# Patient Record
Sex: Female | Born: 1950 | Race: Black or African American | Hispanic: No | Smoking: Current every day smoker
Health system: Southern US, Community
[De-identification: ages and names within clinical notes are randomized; demographics above are authoritative.]

## PROBLEM LIST (undated history)

## (undated) DIAGNOSIS — J45909 Unspecified asthma, uncomplicated: Secondary | ICD-10-CM

## (undated) DIAGNOSIS — E119 Type 2 diabetes mellitus without complications: Secondary | ICD-10-CM

## (undated) DIAGNOSIS — E785 Hyperlipidemia, unspecified: Secondary | ICD-10-CM

## (undated) DIAGNOSIS — M810 Age-related osteoporosis without current pathological fracture: Secondary | ICD-10-CM

## (undated) HISTORY — PX: TUBAL LIGATION: SHX77

## (undated) HISTORY — DX: Unspecified asthma, uncomplicated: J45.909

---

## 1997-12-23 ENCOUNTER — Encounter: Admission: RE | Admit: 1997-12-23 | Discharge: 1997-12-23 | Payer: Self-pay | Admitting: Family Medicine

## 1998-01-19 ENCOUNTER — Encounter: Admission: RE | Admit: 1998-01-19 | Discharge: 1998-01-19 | Payer: Self-pay | Admitting: *Deleted

## 1998-03-22 ENCOUNTER — Encounter: Admission: RE | Admit: 1998-03-22 | Discharge: 1998-03-22 | Payer: Self-pay | Admitting: Family Medicine

## 1998-04-18 ENCOUNTER — Encounter: Admission: RE | Admit: 1998-04-18 | Discharge: 1998-04-18 | Payer: Self-pay | Admitting: Family Medicine

## 1998-05-23 ENCOUNTER — Encounter: Admission: RE | Admit: 1998-05-23 | Discharge: 1998-05-23 | Payer: Self-pay | Admitting: Family Medicine

## 1998-08-02 ENCOUNTER — Other Ambulatory Visit: Admission: RE | Admit: 1998-08-02 | Discharge: 1998-09-08 | Payer: Self-pay

## 1998-08-02 ENCOUNTER — Encounter: Admission: RE | Admit: 1998-08-02 | Discharge: 1998-08-02 | Payer: Self-pay | Admitting: Family Medicine

## 1998-08-09 ENCOUNTER — Encounter: Admission: RE | Admit: 1998-08-09 | Discharge: 1998-08-09 | Payer: Self-pay | Admitting: Family Medicine

## 1999-02-13 ENCOUNTER — Encounter: Admission: RE | Admit: 1999-02-13 | Discharge: 1999-02-13 | Payer: Self-pay | Admitting: Sports Medicine

## 1999-05-28 ENCOUNTER — Encounter: Admission: RE | Admit: 1999-05-28 | Discharge: 1999-08-26 | Payer: Self-pay | Admitting: *Deleted

## 1999-08-23 ENCOUNTER — Encounter: Admission: RE | Admit: 1999-08-23 | Discharge: 1999-08-23 | Payer: Self-pay | Admitting: Family Medicine

## 1999-12-03 ENCOUNTER — Encounter: Admission: RE | Admit: 1999-12-03 | Discharge: 1999-12-03 | Payer: Self-pay | Admitting: Family Medicine

## 2000-01-08 ENCOUNTER — Encounter: Admission: RE | Admit: 2000-01-08 | Discharge: 2000-01-08 | Payer: Self-pay | Admitting: Family Medicine

## 2000-01-29 ENCOUNTER — Encounter: Admission: RE | Admit: 2000-01-29 | Discharge: 2000-01-29 | Payer: Self-pay | Admitting: Family Medicine

## 2000-02-05 ENCOUNTER — Encounter: Admission: RE | Admit: 2000-02-05 | Discharge: 2000-02-05 | Payer: Self-pay | Admitting: *Deleted

## 2000-02-13 ENCOUNTER — Encounter: Admission: RE | Admit: 2000-02-13 | Discharge: 2000-02-13 | Payer: Self-pay | Admitting: Family Medicine

## 2000-02-28 ENCOUNTER — Encounter: Admission: RE | Admit: 2000-02-28 | Discharge: 2000-02-28 | Payer: Self-pay | Admitting: Family Medicine

## 2000-06-09 ENCOUNTER — Encounter: Admission: RE | Admit: 2000-06-09 | Discharge: 2000-06-09 | Payer: Self-pay | Admitting: Family Medicine

## 2000-07-21 ENCOUNTER — Encounter: Admission: RE | Admit: 2000-07-21 | Discharge: 2000-07-21 | Payer: Self-pay | Admitting: Family Medicine

## 2000-08-15 ENCOUNTER — Encounter: Admission: RE | Admit: 2000-08-15 | Discharge: 2000-08-15 | Payer: Self-pay | Admitting: Family Medicine

## 2000-10-15 ENCOUNTER — Encounter: Admission: RE | Admit: 2000-10-15 | Discharge: 2000-10-15 | Payer: Self-pay | Admitting: Family Medicine

## 2000-10-20 ENCOUNTER — Encounter: Admission: RE | Admit: 2000-10-20 | Discharge: 2000-10-20 | Payer: Self-pay | Admitting: Family Medicine

## 2000-10-22 ENCOUNTER — Encounter: Admission: RE | Admit: 2000-10-22 | Discharge: 2000-10-22 | Payer: Self-pay | Admitting: Family Medicine

## 2000-10-24 ENCOUNTER — Encounter: Admission: RE | Admit: 2000-10-24 | Discharge: 2000-10-24 | Payer: Self-pay | Admitting: Family Medicine

## 2000-10-30 ENCOUNTER — Encounter: Admission: RE | Admit: 2000-10-30 | Discharge: 2000-10-30 | Payer: Self-pay | Admitting: Family Medicine

## 2000-12-28 ENCOUNTER — Inpatient Hospital Stay (HOSPITAL_COMMUNITY): Admission: EM | Admit: 2000-12-28 | Discharge: 2000-12-29 | Payer: Self-pay

## 2001-01-02 ENCOUNTER — Encounter: Admission: RE | Admit: 2001-01-02 | Discharge: 2001-01-02 | Payer: Self-pay | Admitting: Family Medicine

## 2001-01-20 ENCOUNTER — Encounter: Admission: RE | Admit: 2001-01-20 | Discharge: 2001-01-20 | Payer: Self-pay | Admitting: Family Medicine

## 2001-03-02 ENCOUNTER — Encounter: Admission: RE | Admit: 2001-03-02 | Discharge: 2001-03-02 | Payer: Self-pay | Admitting: Family Medicine

## 2001-03-04 ENCOUNTER — Encounter: Payer: Self-pay | Admitting: Sports Medicine

## 2001-03-04 ENCOUNTER — Encounter: Admission: RE | Admit: 2001-03-04 | Discharge: 2001-03-04 | Payer: Self-pay | Admitting: Sports Medicine

## 2001-08-06 ENCOUNTER — Encounter: Admission: RE | Admit: 2001-08-06 | Discharge: 2001-08-06 | Payer: Self-pay | Admitting: Family Medicine

## 2001-08-20 ENCOUNTER — Encounter: Admission: RE | Admit: 2001-08-20 | Discharge: 2001-08-20 | Payer: Self-pay | Admitting: Family Medicine

## 2002-02-17 ENCOUNTER — Encounter: Admission: RE | Admit: 2002-02-17 | Discharge: 2002-02-17 | Payer: Self-pay | Admitting: Family Medicine

## 2003-06-29 ENCOUNTER — Encounter (INDEPENDENT_AMBULATORY_CARE_PROVIDER_SITE_OTHER): Payer: Self-pay | Admitting: *Deleted

## 2003-06-29 ENCOUNTER — Encounter: Admission: RE | Admit: 2003-06-29 | Discharge: 2003-06-29 | Payer: Self-pay | Admitting: Family Medicine

## 2004-11-25 ENCOUNTER — Encounter (INDEPENDENT_AMBULATORY_CARE_PROVIDER_SITE_OTHER): Payer: Self-pay | Admitting: *Deleted

## 2004-11-25 LAB — CONVERTED CEMR LAB

## 2006-04-11 ENCOUNTER — Ambulatory Visit: Payer: Self-pay | Admitting: Family Medicine

## 2006-06-18 ENCOUNTER — Encounter (INDEPENDENT_AMBULATORY_CARE_PROVIDER_SITE_OTHER): Payer: Self-pay | Admitting: Family Medicine

## 2006-06-18 ENCOUNTER — Ambulatory Visit: Payer: Self-pay | Admitting: Family Medicine

## 2006-06-18 LAB — CONVERTED CEMR LAB
ALT: 10 units/L (ref 0–35)
AST: 11 units/L (ref 0–37)
Albumin: 4.4 g/dL (ref 3.5–5.2)
Alkaline Phosphatase: 91 units/L (ref 39–117)
BUN: 10 mg/dL (ref 6–23)
CO2: 23 meq/L (ref 19–32)
Calcium: 9 mg/dL (ref 8.4–10.5)
Chloride: 103 meq/L (ref 96–112)
Cholesterol: 196 mg/dL (ref 0–200)
Creatinine, Ser: 0.64 mg/dL (ref 0.40–1.20)
Glucose, Bld: 325 mg/dL — ABNORMAL HIGH (ref 70–99)
HDL: 53 mg/dL (ref >39)
LDL Cholesterol: 127 mg/dL — ABNORMAL HIGH (ref 0–99)
Potassium: 4.1 meq/L (ref 3.5–5.3)
Sodium: 138 meq/L (ref 135–145)
Total Bilirubin: 0.4 mg/dL (ref 0.3–1.2)
Total CHOL/HDL Ratio: 3.7
Total Protein: 7.3 g/dL (ref 6.0–8.3)
Triglycerides: 79 mg/dL (ref <150)
VLDL: 16 mg/dL (ref 0–40)

## 2006-06-19 DIAGNOSIS — E119 Type 2 diabetes mellitus without complications: Secondary | ICD-10-CM

## 2006-06-20 ENCOUNTER — Encounter (INDEPENDENT_AMBULATORY_CARE_PROVIDER_SITE_OTHER): Payer: Self-pay | Admitting: *Deleted

## 2006-07-01 ENCOUNTER — Telehealth: Payer: Self-pay | Admitting: *Deleted

## 2006-07-02 ENCOUNTER — Telehealth (INDEPENDENT_AMBULATORY_CARE_PROVIDER_SITE_OTHER): Payer: Self-pay | Admitting: *Deleted

## 2007-06-11 ENCOUNTER — Ambulatory Visit: Payer: Self-pay | Admitting: Family Medicine

## 2007-06-11 ENCOUNTER — Telehealth: Payer: Self-pay | Admitting: *Deleted

## 2007-06-11 ENCOUNTER — Encounter: Payer: Self-pay | Admitting: Family Medicine

## 2007-06-11 LAB — CONVERTED CEMR LAB
Chlamydia, DNA Probe: NEGATIVE
GC Probe Amp, Genital: NEGATIVE
Whiff Test: POSITIVE

## 2007-06-12 ENCOUNTER — Telehealth: Payer: Self-pay | Admitting: Family Medicine

## 2007-06-17 ENCOUNTER — Encounter: Payer: Self-pay | Admitting: Family Medicine

## 2007-06-18 LAB — CONVERTED CEMR LAB: Pap Smear: NORMAL

## 2007-12-21 ENCOUNTER — Ambulatory Visit: Payer: Self-pay | Admitting: Family Medicine

## 2007-12-21 ENCOUNTER — Encounter: Payer: Self-pay | Admitting: Family Medicine

## 2007-12-21 LAB — CONVERTED CEMR LAB
BUN: 14 mg/dL (ref 6–23)
Chlamydia, DNA Probe: NEGATIVE
Creatinine, Ser: 0.67 mg/dL (ref 0.40–1.20)
GC Probe Amp, Genital: NEGATIVE
Hgb A1c MFr Bld: 9.4 %
Nitrite: NEGATIVE
Specific Gravity, Urine: 1.015
Whiff Test: NEGATIVE

## 2007-12-24 ENCOUNTER — Encounter: Payer: Self-pay | Admitting: Family Medicine

## 2008-04-28 ENCOUNTER — Telehealth: Payer: Self-pay | Admitting: *Deleted

## 2008-04-28 ENCOUNTER — Encounter: Payer: Self-pay | Admitting: *Deleted

## 2008-07-20 ENCOUNTER — Ambulatory Visit: Payer: Self-pay | Admitting: Family Medicine

## 2008-07-20 ENCOUNTER — Telehealth: Payer: Self-pay | Admitting: *Deleted

## 2008-07-20 LAB — CONVERTED CEMR LAB: Hgb A1c MFr Bld: 8.7 %

## 2009-02-08 ENCOUNTER — Ambulatory Visit: Payer: Self-pay | Admitting: Family Medicine

## 2009-02-09 ENCOUNTER — Encounter: Payer: Self-pay | Admitting: *Deleted

## 2009-11-20 ENCOUNTER — Ambulatory Visit: Payer: Self-pay | Admitting: Family Medicine

## 2009-11-20 ENCOUNTER — Encounter: Payer: Self-pay | Admitting: Family Medicine

## 2009-12-07 ENCOUNTER — Telehealth: Payer: Self-pay | Admitting: Family Medicine

## 2010-01-26 ENCOUNTER — Encounter: Payer: Self-pay | Admitting: Family Medicine

## 2010-02-16 ENCOUNTER — Encounter (INDEPENDENT_AMBULATORY_CARE_PROVIDER_SITE_OTHER): Payer: Self-pay | Admitting: Pharmacist

## 2010-02-22 ENCOUNTER — Encounter: Payer: Self-pay | Admitting: Family Medicine

## 2010-04-17 ENCOUNTER — Encounter: Payer: Self-pay | Admitting: *Deleted

## 2010-04-18 ENCOUNTER — Ambulatory Visit
Admission: RE | Admit: 2010-04-18 | Discharge: 2010-04-18 | Payer: Self-pay | Source: Home / Self Care | Attending: Family Medicine | Admitting: Family Medicine

## 2010-04-18 ENCOUNTER — Encounter: Payer: Self-pay | Admitting: Family Medicine

## 2010-05-22 NOTE — Miscellaneous (Signed)
Summary: Consent Skin Bx  Consent Skin Bx   Imported By: Clydell Hakim 11/23/2009 15:18:35  _____________________________________________________________________  External Attachment:    Type:   Image     Comment:   External Document

## 2010-05-22 NOTE — Assessment & Plan Note (Signed)
Summary: rash,tcb   Vital Signs:  Patient profile:   60 year old female Height:      67.0 inches Weight:      108 pounds BMI:     16.98 Temp:     98.3 degrees F oral BP sitting:   140 / 60  (right arm) Cuff size:   regular  Vitals Entered By: Tessie Fass CMA (November 20, 2009 2:22 PM) CC: body rash x 3 months Is Patient Diabetic? Yes Pain Assessment Patient in pain? no        Primary Care Provider:  Ardeen Garland  MD  CC:  body rash x 3 months.  History of Present Illness: Rash all over body for 3 months.  Only itches "when her blood sugars are high".  Using OTC 1% hydrocortisone and some old [place] lotion.  Denies fever or acute illness.  Has not been in to see provider for a while to check up on her diabetes.  Current Medications (verified): 1)  Metformin Hcl 1000 Mg Tabs (Metformin Hcl) .... 1/2 Tab At Night For 1 Week, Then 1 Tab At Night For 1 Week, Then 1/2 Tab in Am  and 1 Tab At Night For 1 Week, and Then 1 Tab Twice A Day Thereafter 2)  Glipizide 10 Mg  Tabs (Glipizide) .Marland Kitchen.. 1 Tab By Mouth Two Times A Day 3)  Hydrocortisone 2.5 % Lotn (Hydrocortisone) .... Apply To Rash Two Times A Day (240 Gm or Similar, Rash If Diffuse and Covers 20 % of Body)  Allergies (verified): 1)  ! Sulfa  Physical Exam  General:  Alert, very thin AA female Skin:  multiple macular, dry lesions on legs, breasts, groin, hands. Cervical Nodes:  No lymphadenopathy noted   Impression & Recommendations:  Problem # 1:  SKIN RASH (ICD-782.1) Punch biopsy completed by Dr. Wynona Neat wtih 4mm punch.  Topical steroids.  Return for 2 week check. Her updated medication list for this problem includes:    Hydrocortisone 2.5 % Lotn (Hydrocortisone) .Marland Kitchen... Apply to rash two times a day (240 gm or similar, rash if diffuse and covers 20 % of body)  Orders: [facility]- Est Level  3 ([address]) Punch Biopsy- [facility] ([address])  Problem # 2:  DIABETES MELLITUS II, UNCOMPLICATED (ICD-250.00) Has not been in for check since  October 2010, to meet new MD and have A1C and other prevention addressed Her updated medication list for this problem includes:    Metformin Hcl 1000 Mg Tabs (Metformin hcl) .Marland Kitchen... 1/2 tab at night for 1 week, then 1 tab at night for 1 week, then 1/2 tab in am  and 1 tab at night for 1 week, and then 1 tab twice a day thereafter    Glipizide 10 Mg Tabs (Glipizide) .Marland Kitchen... 1 tab by mouth two times a day  Complete Medication List: 1)  Metformin Hcl 1000 Mg Tabs (Metformin hcl) .... 1/2 tab at night for 1 week, then 1 tab at night for 1 week, then 1/2 tab in am  and 1 tab at night for 1 week, and then 1 tab twice a day thereafter 2)  Glipizide 10 Mg Tabs (Glipizide) .Marland Kitchen.. 1 tab by mouth two times a day 3)  Hydrocortisone 2.5 % Lotn (Hydrocortisone) .... Apply to rash two times a day (240 gm or similar, rash if diffuse and covers 20 % of body)  Patient Instructions: 1)  Use lotion on rash twice daily 2)  Follow up with primary MD in 2 weeks, you need  your diabetes checked. Prescriptions: HYDROCORTISONE 2.5 % LOTN (HYDROCORTISONE) apply to rash two times a day (240 GM or similar, rash if diffuse and covers 20 % of body)  #1 x 3   Entered and Authorized by:   Luretha Murphy NP   Signed by:   Luretha Murphy NP on 11/20/2009   Method used:   Electronically to        [facility] (retail)       [address]       [place], [place]  [address]       Ph: [phone]       Fax: [phone]   RxID:   [id number]

## 2010-05-22 NOTE — Miscellaneous (Signed)
Summary: Orders Update   Clinical Lists Changes  Problems: Added new problem of ENCOUNTER FOR LONG-TERM USE OF OTHER MEDICATIONS (ICD-V58.69) Orders: Added new Test order of B12-[facility] [phone]) - Signed Added new Test order of CBC-[facility] ([address]56) - Signed  Ok per Dr. Fara Boros

## 2010-05-22 NOTE — Progress Notes (Signed)
Summary: Test Res  Medications Added HYDROCORTISONE 2.5 % CREA (HYDROCORTISONE) Apply to rash two times a day (larger tube if possible, rash covers >20% of body)       Phone Note Call from Patient Call back at Home Phone [phone]   Caller: Patient Summary of Call: Pt was unable to keep appt today and wondering if someone could call her back with her bx results she had the last time she was in. Initial call taken by: Clydell Hakim,  December 07, 2009 2:45 PM  Follow-up for Phone Call        states she never bought the hydrocortisone creme as it was over $100. wants to know what else can she use that does not cost so much. she has rescheduled her appt for 12/26/09 Follow-up by: Golden Circle RN,  December 07, 2009 3:11 PM    Additional Follow-up for Phone Call Additional follow up Details #2::    Attempted to call pt to inform her I have called in a different prescription that should only be $4 to the [facility] on [address] Rx should be ready later today for her to pick up.  Follow-up by: Demetria Pore MD,  December 07, 2009 4:17 PM  New/Updated Medications: HYDROCORTISONE 2.5 % CREA (HYDROCORTISONE) Apply to rash two times a day (larger tube if possible, rash covers >20% of body) Prescriptions: HYDROCORTISONE 2.5 % CREA (HYDROCORTISONE) Apply to rash two times a day (larger tube if possible, rash covers >20% of body)  #1 tube x 1   Entered by:   Demetria Pore MD   Authorized by:   Luretha Murphy NP   Signed by:   Demetria Pore MD on 12/07/2009   Method used:   Electronically to        [facility] (retail)       [address]       [place], [place]  [address]       Ph: [phone]       Fax: [phone]   RxID:   [id number]

## 2010-05-22 NOTE — Miscellaneous (Signed)
   Clinical Lists Changes  Problems: Removed problem of ENCOUNTER FOR LONG-TERM USE OF OTHER MEDICATIONS (ICD-V58.69) Removed problem of SKIN RASH (ICD-782.1) Removed problem of ANOREXIA (ICD-783.0) Removed problem of CORNS AND CALLUSES (ICD-700) Removed problem of SEXUALLY TRANSMITTED DISEASE, EXPOSURE TO (ICD-V01.6) Removed problem of DYSURIA (ICD-788.1) Removed problem of TRICHOMONIASIS (ICD-131.9) Removed problem of VAGINAL DISCHARGE (ICD-623.5) Removed problem of GYNECOLOGICAL EXAMINATIONOUTINE (ICD-V72.31) Removed problem of SCREENING FOR MALIGNANT NEOPLASM(ICD-V76.2) Removed problem of TOBACCO DEPENDENCE (ICD-305.1) Removed problem of MENOPAUSAL SYNDROME (ICD-627.2)

## 2010-05-22 NOTE — Letter (Signed)
Summary: Generic Letter  [facility]     [place], [place]    Phone:   Fax:     01/26/2010  [facility] [address] [place], [place]  [address]  Dear Ms. Ndiaye,   I am writing to inform you that you need to make an appointment to come in for your yearly well-woman visit, which includes a PAP smear. Your most recent PAP smear was normal, but we like to do them every year. In addition to your PAP smear for cervical cancer screening, you also need to call and have a mammogram done for breast cancer screening, as well as a colonoscopy for colon cancer screening. Please call our office to schedule an appointment with me, Dr. Fara Boros, your new primary care doctor.  I look forward to meeting you!        Sincerely,   Demetria Pore MD  Appended Document: Generic Letter mailed.

## 2010-05-23 ENCOUNTER — Telehealth: Payer: Self-pay | Admitting: Family Medicine

## 2010-05-23 LAB — CONVERTED CEMR LAB
BUN: 9 mg/dL (ref 6–23)
Calcium: 9.7 mg/dL (ref 8.4–10.5)
Creatinine, Ser: 0.75 mg/dL (ref 0.40–1.20)
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
## 2010-05-24 NOTE — Miscellaneous (Signed)
Summary: regarding meds   Clinical Lists Changes  received refill request for glipizide. called patient to schedule appointment for follow up. shes states she has been out of metformin and has not taken for a while . she states at some point this refilled was denied.  she has not been in in over a year for check up .  appointment scheduld tomorrow with Dr. Denyse Amass for diabetes follow up and med refills. appointment scheduled 05/11/2010 for CPE with Dr. Fara Boros. patient states she  does not have  the necessary co payment . checked with Britta Mccreedy and she advises since patient needs to been seen in order for mds to be refilled  she can be billed for this co pay. Theresia Lo RN  April 17, 2010 11:46 AM

## 2010-05-25 ENCOUNTER — Ambulatory Visit: Admit: 2010-05-25 | Payer: Self-pay

## 2010-05-25 ENCOUNTER — Encounter: Payer: Self-pay | Admitting: Family Medicine

## 2010-05-25 ENCOUNTER — Other Ambulatory Visit: Payer: Self-pay | Admitting: Hematology and Oncology

## 2010-05-25 ENCOUNTER — Encounter (INDEPENDENT_AMBULATORY_CARE_PROVIDER_SITE_OTHER): Payer: BC Managed Care – PPO | Admitting: Family Medicine

## 2010-05-25 ENCOUNTER — Other Ambulatory Visit: Payer: Self-pay | Admitting: Family Medicine

## 2010-05-25 DIAGNOSIS — M810 Age-related osteoporosis without current pathological fracture: Secondary | ICD-10-CM

## 2010-05-25 DIAGNOSIS — Z0000 Encounter for general adult medical examination without abnormal findings: Secondary | ICD-10-CM

## 2010-05-25 DIAGNOSIS — E119 Type 2 diabetes mellitus without complications: Secondary | ICD-10-CM

## 2010-05-25 DIAGNOSIS — E785 Hyperlipidemia, unspecified: Secondary | ICD-10-CM

## 2010-05-25 DIAGNOSIS — Z124 Encounter for screening for malignant neoplasm of cervix: Secondary | ICD-10-CM

## 2010-05-25 LAB — CONVERTED CEMR LAB
Cholesterol, target level: 200 mg/dL
Cholesterol: 182 mg/dL (ref 0–200)
HDL: 51 mg/dL (ref >39)
LDL Goal: 100 mg/dL
Platelets: 280 10*3/uL (ref 150–400)
RDW: 14.2 % (ref 11.5–15.5)
Total CHOL/HDL Ratio: 3.6
Triglycerides: 114 mg/dL (ref <150)
WBC: 16.1 10*3/uL — ABNORMAL HIGH (ref 4.0–10.5)

## 2010-05-25 LAB — VITAMIN B12: Vitamin B-12: 573 pg/mL (ref 211–911)

## 2010-05-25 LAB — CBC
MCH: 32 pg (ref 26.0–34.0)
MCHC: 32.5 g/dL (ref 30.0–36.0)
Platelets: 280 10*3/uL (ref 150–400)
RDW: 14.2 % (ref 11.5–15.5)

## 2010-05-25 LAB — LIPID PANEL
HDL: 51 mg/dL (ref >39)
LDL Cholesterol: 108 mg/dL — ABNORMAL HIGH (ref 0–99)
Total CHOL/HDL Ratio: 3.6 Ratio
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
## 2010-05-30 NOTE — Progress Notes (Signed)
 ----   Converted from flag ---- ---- 05/23/2010 9:00 AM, Demetria Pore MD wrote: Elvina Sidle, can anyone call Ms. Dung to remind her to come in on Friday fasting so we can get a FLP? I'm going to put the future order in now. Or, if she wants, she can come in tomorrow for it if for some reason she doesn't want to be fasting for her 9a appt Fri. Thanks!!!! Annice Pih ------------------------------  attemted to call pt, no answer or voicemail. will try again later.spoke with pt. she will be here at 8:45. told her she can bring something to eat & drink after she has her labs drawn. she agreed.[place] RN  May 24, 2010 9:14 AM  Randie Heinz. Thanks! Demetria Pore MD  May 24, 2010 10:46 AM

## 2010-05-31 ENCOUNTER — Encounter: Payer: Self-pay | Admitting: Family Medicine

## 2010-05-31 DIAGNOSIS — E119 Type 2 diabetes mellitus without complications: Secondary | ICD-10-CM

## 2010-06-07 ENCOUNTER — Encounter: Payer: Self-pay | Admitting: *Deleted

## 2010-07-05 ENCOUNTER — Encounter: Payer: Self-pay | Admitting: *Deleted
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
## 2014-05-17 ENCOUNTER — Emergency Department (HOSPITAL_COMMUNITY): Payer: BC Managed Care – PPO

## 2014-05-17 ENCOUNTER — Encounter (HOSPITAL_COMMUNITY): Payer: Self-pay

## 2014-05-17 ENCOUNTER — Observation Stay (HOSPITAL_COMMUNITY)
Admission: EM | Admit: 2014-05-17 | Discharge: 2014-05-19 | Disposition: A | Discharge status: Home / Self Care | Payer: BC Managed Care – PPO | Attending: Internal Medicine | Admitting: Internal Medicine

## 2014-05-17 DIAGNOSIS — E118 Type 2 diabetes mellitus with unspecified complications: Secondary | ICD-10-CM

## 2014-05-17 DIAGNOSIS — M810 Age-related osteoporosis without current pathological fracture: Secondary | ICD-10-CM | POA: Diagnosis not present

## 2014-05-17 DIAGNOSIS — R52 Pain, unspecified: Secondary | ICD-10-CM

## 2014-05-17 DIAGNOSIS — S22080D Wedge compression fracture of T11-T12 vertebra, subsequent encounter for fracture with routine healing: Secondary | ICD-10-CM

## 2014-05-17 DIAGNOSIS — R911 Solitary pulmonary nodule: Secondary | ICD-10-CM | POA: Diagnosis not present

## 2014-05-17 DIAGNOSIS — E119 Type 2 diabetes mellitus without complications: Secondary | ICD-10-CM | POA: Insufficient documentation

## 2014-05-17 DIAGNOSIS — R42 Dizziness and giddiness: Secondary | ICD-10-CM | POA: Insufficient documentation

## 2014-05-17 DIAGNOSIS — E1165 Type 2 diabetes mellitus with hyperglycemia: Secondary | ICD-10-CM

## 2014-05-17 DIAGNOSIS — R109 Unspecified abdominal pain: Secondary | ICD-10-CM | POA: Diagnosis not present

## 2014-05-17 DIAGNOSIS — Z882 Allergy status to sulfonamides status: Secondary | ICD-10-CM | POA: Insufficient documentation

## 2014-05-17 DIAGNOSIS — F17210 Nicotine dependence, cigarettes, uncomplicated: Secondary | ICD-10-CM | POA: Insufficient documentation

## 2014-05-17 DIAGNOSIS — R1110 Vomiting, unspecified: Secondary | ICD-10-CM | POA: Insufficient documentation

## 2014-05-17 DIAGNOSIS — E785 Hyperlipidemia, unspecified: Secondary | ICD-10-CM | POA: Insufficient documentation

## 2014-05-17 DIAGNOSIS — R55 Syncope and collapse: Principal | ICD-10-CM | POA: Diagnosis present

## 2014-05-17 DIAGNOSIS — M4854XA Collapsed vertebra, not elsewhere classified, thoracic region, initial encounter for fracture: Secondary | ICD-10-CM | POA: Insufficient documentation

## 2014-05-17 DIAGNOSIS — S22080A Wedge compression fracture of T11-T12 vertebra, initial encounter for closed fracture: Secondary | ICD-10-CM | POA: Diagnosis present

## 2014-05-17 DIAGNOSIS — IMO0002 Reserved for concepts with insufficient information to code with codable children: Secondary | ICD-10-CM

## 2014-05-17 DIAGNOSIS — Z79899 Other long term (current) drug therapy: Secondary | ICD-10-CM | POA: Insufficient documentation

## 2014-05-17 HISTORY — DX: Age-related osteoporosis without current pathological fracture: M81.0

## 2014-05-17 HISTORY — DX: Type 2 diabetes mellitus without complications: E11.9

## 2014-05-17 HISTORY — DX: Hyperlipidemia, unspecified: E78.5

## 2014-05-17 LAB — I-STAT CHEM 8, ED
BUN: 8 mg/dL (ref 6–23)
BUN: 9 mg/dL (ref 6–23)
CALCIUM ION: 1.11 mmol/L — AB (ref 1.13–1.30)
CALCIUM ION: 1.12 mmol/L — AB (ref 1.13–1.30)
CREATININE: 0.7 mg/dL (ref 0.50–1.10)
Chloride: 102 mmol/L (ref 96–112)
Chloride: 103 mmol/L (ref 96–112)
Creatinine, Ser: 0.6 mg/dL (ref 0.50–1.10)
GLUCOSE: 215 mg/dL — AB (ref 70–99)
Glucose, Bld: 216 mg/dL — ABNORMAL HIGH (ref 70–99)
HCT: 46 % (ref 36.0–46.0)
HEMATOCRIT: 46 % (ref 36.0–46.0)
HEMOGLOBIN: 15.6 g/dL — AB (ref 12.0–15.0)
Hemoglobin: 15.6 g/dL — ABNORMAL HIGH (ref 12.0–15.0)
POTASSIUM: 4.1 mmol/L (ref 3.5–5.1)
Potassium: 4 mmol/L (ref 3.5–5.1)
SODIUM: 140 mmol/L (ref 135–145)
Sodium: 139 mmol/L (ref 135–145)
TCO2: 20 mmol/L (ref 0–100)
TCO2: 21 mmol/L (ref 0–100)

## 2014-05-17 LAB — CBC
HEMATOCRIT: 41.2 % (ref 36.0–46.0)
HEMOGLOBIN: 13.5 g/dL (ref 12.0–15.0)
MCH: 32.4 pg (ref 26.0–34.0)
MCHC: 32.8 g/dL (ref 30.0–36.0)
MCV: 98.8 fL (ref 78.0–100.0)
Platelets: 316 10*3/uL (ref 150–400)
RBC: 4.17 MIL/uL (ref 3.87–5.11)
RDW: 13.4 % (ref 11.5–15.5)
WBC: 12.2 10*3/uL — AB (ref 4.0–10.5)

## 2014-05-17 LAB — COMPREHENSIVE METABOLIC PANEL
ALBUMIN: 4.4 g/dL (ref 3.5–5.2)
ALT: 17 U/L (ref 0–35)
AST: 26 U/L (ref 0–37)
Alkaline Phosphatase: 89 U/L (ref 39–117)
Anion gap: 9 (ref 5–15)
BILIRUBIN TOTAL: 0.6 mg/dL (ref 0.3–1.2)
BUN: 9 mg/dL (ref 6–23)
CALCIUM: 9.6 mg/dL (ref 8.4–10.5)
CO2: 26 mmol/L (ref 19–32)
Chloride: 102 mmol/L (ref 96–112)
Creatinine, Ser: 0.69 mg/dL (ref 0.50–1.10)
Glucose, Bld: 213 mg/dL — ABNORMAL HIGH (ref 70–99)
Potassium: 4.2 mmol/L (ref 3.5–5.1)
SODIUM: 137 mmol/L (ref 135–145)
Total Protein: 8 g/dL (ref 6.0–8.3)

## 2014-05-17 LAB — TROPONIN I

## 2014-05-17 LAB — PROTIME-INR
INR: 0.93 (ref 0.00–1.49)
PROTHROMBIN TIME: 12.6 s (ref 11.6–15.2)

## 2014-05-17 LAB — TYPE AND SCREEN
ABO/RH(D): A POS
ANTIBODY SCREEN: NEGATIVE

## 2014-05-17 LAB — URINALYSIS, ROUTINE W REFLEX MICROSCOPIC
BILIRUBIN URINE: NEGATIVE
GLUCOSE, UA: NEGATIVE mg/dL
Hgb urine dipstick: NEGATIVE
Ketones, ur: NEGATIVE mg/dL
LEUKOCYTES UA: NEGATIVE
Nitrite: NEGATIVE
PROTEIN: NEGATIVE mg/dL
Specific Gravity, Urine: 1.037 — ABNORMAL HIGH (ref 1.005–1.030)
UROBILINOGEN UA: 0.2 mg/dL (ref 0.0–1.0)
pH: 6.5 (ref 5.0–8.0)

## 2014-05-17 LAB — LIPASE, BLOOD: Lipase: 26 U/L (ref 11–59)

## 2014-05-17 LAB — GLUCOSE, CAPILLARY: Glucose-Capillary: 222 mg/dL — ABNORMAL HIGH (ref 70–99)

## 2014-05-17 LAB — I-STAT TROPONIN, ED: Troponin i, poc: 0.01 ng/mL (ref 0.00–0.08)

## 2014-05-17 LAB — CBG MONITORING, ED: Glucose-Capillary: 187 mg/dL — ABNORMAL HIGH (ref 70–99)

## 2014-05-17 MED ORDER — HYDROMORPHONE HCL 1 MG/ML IJ SOLN
1.0000 mg | Freq: Once | INTRAMUSCULAR | Status: AC
  Administered 2014-05-17: 1 mg via INTRAVENOUS
  Filled 2014-05-17: qty 1

## 2014-05-17 MED ORDER — HYDROMORPHONE HCL 1 MG/ML IJ SOLN
0.5000 mg | Freq: Four times a day (QID) | INTRAMUSCULAR | Status: DC | PRN
  Administered 2014-05-17 – 2014-05-19 (×4): 0.5 mg via INTRAVENOUS
  Filled 2014-05-17 (×4): qty 1

## 2014-05-17 MED ORDER — HYDROMORPHONE HCL 1 MG/ML IJ SOLN: 1.0000 mg | Freq: Once | INTRAMUSCULAR | Status: DC

## 2014-05-17 MED ORDER — HYDROMORPHONE HCL 1 MG/ML IJ SOLN
0.5000 mg | Freq: Once | INTRAMUSCULAR | Status: AC
  Administered 2014-05-17: 0.5 mg via INTRAVENOUS
  Filled 2014-05-17: qty 1

## 2014-05-17 MED ORDER — INSULIN ASPART 100 UNIT/ML ~~LOC~~ SOLN
0.0000 [IU] | Freq: Three times a day (TID) | SUBCUTANEOUS | Status: DC
  Administered 2014-05-18: 1 [IU] via SUBCUTANEOUS
  Administered 2014-05-18: 2 [IU] via SUBCUTANEOUS

## 2014-05-17 MED ORDER — SODIUM CHLORIDE 0.9 % IV SOLN
1000.0000 mL | INTRAVENOUS | Status: DC
  Administered 2014-05-17: 1000 mL via INTRAVENOUS

## 2014-05-17 MED ORDER — ONDANSETRON HCL 4 MG PO TABS
4.0000 mg | ORAL_TABLET | Freq: Four times a day (QID) | ORAL | Status: DC | PRN
Start: 2014-05-17 — End: 2014-05-19

## 2014-05-17 MED ORDER — SODIUM CHLORIDE 0.9 % IV SOLN
1000.0000 mL | INTRAVENOUS | Status: AC
  Administered 2014-05-17: 1000 mL via INTRAVENOUS

## 2014-05-17 MED ORDER — IOHEXOL 350 MG/ML SOLN
100.0000 mL | Freq: Once | INTRAVENOUS | Status: AC | PRN
  Administered 2014-05-17: 100 mL via INTRAVENOUS

## 2014-05-17 MED ORDER — HYDROCODONE-ACETAMINOPHEN 5-325 MG PO TABS
1.0000 | ORAL_TABLET | ORAL | Status: DC | PRN
  Administered 2014-05-17: 2 via ORAL
  Administered 2014-05-18 (×2): 1 via ORAL
  Administered 2014-05-19: 2 via ORAL
  Filled 2014-05-17: qty 2
  Filled 2014-05-17 (×2): qty 1
  Filled 2014-05-17: qty 2
  Filled 2014-05-17: qty 1

## 2014-05-17 MED ORDER — ONDANSETRON HCL 4 MG/2ML IJ SOLN
4.0000 mg | Freq: Once | INTRAMUSCULAR | Status: AC
  Administered 2014-05-17: 4 mg via INTRAVENOUS
  Filled 2014-05-17: qty 2

## 2014-05-17 MED ORDER — ONDANSETRON HCL 4 MG/2ML IJ SOLN
4.0000 mg | Freq: Four times a day (QID) | INTRAMUSCULAR | Status: DC | PRN
  Administered 2014-05-17 – 2014-05-19 (×5): 4 mg via INTRAVENOUS
  Filled 2014-05-17 (×5): qty 2

## 2014-05-17 MED ORDER — DOCUSATE SODIUM 100 MG PO CAPS
100.0000 mg | ORAL_CAPSULE | Freq: Two times a day (BID) | ORAL | Status: DC | PRN
  Filled 2014-05-17: qty 1

## 2014-05-17 MED ORDER — ENOXAPARIN SODIUM 40 MG/0.4ML ~~LOC~~ SOLN
40.0000 mg | SUBCUTANEOUS | Status: DC
  Administered 2014-05-17 – 2014-05-18 (×2): 40 mg via SUBCUTANEOUS
  Filled 2014-05-17 (×3): qty 0.4

## 2014-05-17 NOTE — H&P (Signed)
[facility] History and Physical  SUESAN MOHRMANN ZOX:[id number] DOB: 11/27/1950 DOA: 05/17/2014  PCP: No primary care provider on file.   Chief Complaint: Syncope and collapse with subsequent back pain  HPI: Ana Hines is a 64 y.o. female who came to the [facility] ED 05/17/2014, initially accompanied by two of her sons, after having an unwitnessed fainting spell at home.  One of her sons heard the fall and rushed to her assistance.  The patient remembers "ringing in her ears" prior to collapsing, but doesn't remember anything after that until she was being helped up off the floor.  She woke up with acute back pain, exacerbated by any movement.  No history of dizziness, chest pain, cough/cold/congestion.  She denies any new medications.  ED evaluation concerning for acute T12 compression fracture.  She is being admitted for observation.  Review of Systems: 12 systems reviewed and negative except as stated in HPI.  Past Medical History  Diagnosis Date  . Diabetes mellitus without complication   . Hyperlipidemia   . Osteoporosis    Past Surgical History  Procedure Laterality Date  . Tubal ligation     Social History:  History   Social History Narrative  . No narrative on file  Active tobacco use; has smoked since age of 32, currently 1/2 PPD Drinks two beers twice weekly Occasional marijuana use Not married, has four sons, five grandchildren Retired from the state  Allergies  Allergen Reactions  . Sulfonamide Derivatives     Family History  Problem Relation Age of Onset  . Breast cancer Sister   . Ovarian cancer Cousin     Prior to Admission medications   Medication Sig Start Date End Date Taking? Authorizing Provider  glipiZIDE (GLUCOTROL) 10 MG tablet Take 1 tablet (10 mg total) by mouth 2 (two) times daily. 05/31/10   Jacquelyn A McGill, MD  hydrocortisone 2.5 % cream Apply topically 2 (two) times daily. to rash     Historical Provider, MD  metFORMIN (GLUCOPHAGE)  1000 MG tablet Take 0.5 tablets (500 mg total) by mouth 2 (two) times daily with meals. 05/31/10   Tito Dine, MD   Physical Exam: Filed Vitals:   05/17/14 1522 05/17/14 1600 05/17/14 1630 05/17/14 1910  BP: 172/102 162/93 139/67 135/70  Pulse: 78 71 74 76  Temp:    98.7 F (37.1 C)  TempSrc:    Oral  Resp:  25 0 16  SpO2: 100% 100% 95% 96%     General:  Lethargic from IV pain meds but arousable, oriented, and appropriate.  Able to give her own clinical history.  Eyes: PERRL  ENT: Posterior oropharynx clear, wearing upper dentures  Neck: supple, no carotid bruit  Cardiovascular: NR/RR  Respiratory: CTA bilaterally  Abdomen: S/NT/ND  Skin: warm and dry  Musculoskeletal: Moves all four extremities spontaneously  Psychiatric: Appropriate affect  Neurologic: no focal deficits  Labs on Admission:  Basic Metabolic Panel:  Recent Labs Lab 05/17/14 1537 05/17/14 1557 05/17/14 1602  NA 139 137 140  K 4.1 4.2 4.0  CL 102 102 103  CO2  --  26  --   GLUCOSE 215* 213* 216*  BUN CREATININE 0.70 0.69 0.60  CALCIUM  --  9.6  --    Liver Function Tests:  Recent Labs Lab 05/17/14 1557  AST 26  ALT 17  ALKPHOS 89  BILITOT 0.6  PROT 8.0  ALBUMIN 4.4    Recent Labs Lab 05/17/14 1557  LIPASE 26   No results for input(s): AMMONIA in the last 168 hours. CBC:  Recent Labs Lab 05/17/14 1528 05/17/14 1537 05/17/14 1602  WBC 12.2*  --   --   HGB 13.5 15.6* 15.6*  HCT 41.2 46.0 46.0  MCV 98.8  --   --   PLT 316  --   --     Recent Labs Lab 05/17/14 1539  GLUCAP 187*    Radiological Exams on Admission: Ct Head Wo Contrast  05/17/2014   CLINICAL DATA:  Loss of consciousness, dizziness  EXAM: CT HEAD WITHOUT CONTRAST  TECHNIQUE: Contiguous axial images were obtained from the base of the skull through the vertex without intravenous contrast.  COMPARISON:  None.  FINDINGS: No skull fracture is noted. Paranasal sinuses are unremarkable. Partial  opacification of left inferior mastoid air cells.  No intracranial hemorrhage, mass effect or midline shift. Mild cerebral atrophy. No acute cortical infarction. No mass lesion is noted on this unenhanced scan. The gray and white-matter differentiation is preserved.  IMPRESSION: No acute intracranial abnormality.  Mild cerebral atrophy.   Electronically Signed   By: Natasha Mead M.D.   On: 05/17/2014 17:08   Dg Chest Port 1 View  05/17/2014   CLINICAL DATA:  64 year old female with dizziness and syncopal episode. Diabetic.  EXAM: PORTABLE CHEST - 1 VIEW  COMPARISON:  None.  FINDINGS: No infiltrate or congestive heart.  No gross pneumothorax.  Calcified mildly tortuous aorta.  Heart size within normal limits.  No acute osseous abnormality noted.  IMPRESSION: No infiltrate or congestive heart failure.  Calcified slightly tortuous aorta.   Electronically Signed   By: Bridgett Larsson M.D.   On: 05/17/2014 16:18   Ct Angio Chest Aorta W/cm &/or Wo/cm  05/17/2014   CLINICAL DATA:  Acute thoracic back pain.  Dizziness.  EXAM: CT ANGIOGRAPHY CHEST, ABDOMEN AND PELVIS  TECHNIQUE: Multidetector CT imaging through the chest, abdomen and pelvis was performed using the standard protocol during bolus administration of intravenous contrast. Multiplanar reconstructed images and MIPs were obtained and reviewed to evaluate the vascular anatomy.  CONTRAST:  OMNIPAQUE IOHEXOL 350 MG/ML SOLN  COMPARISON:  None.  FINDINGS: CTA CHEST FINDINGS  Mild wedge compression deformity of T12 vertebral body is noted consistent with acute compression fracture. No pneumothorax or pleural effusion is noted. 6 mm nodule is noted laterally in the right lower lobe. No other significant pulmonary parenchymal abnormality is noted. There is no evidence of thoracic aortic dissection or aneurysm. Great vessels are widely patent without significant stenosis. No mediastinal mass or adenopathy is noted. There is no evidence of pulmonary embolus.  Review  of the MIP images confirms the above findings.  CTA ABDOMEN AND PELVIS FINDINGS  There is no evidence of abdominal aortic dissection or aneurysm. Iliac arteries are widely patent. The mesenteric and renal arteries are widely patent without significant stenosis. No abnormality is noted in the liver, spleen or pancreas. Adrenal glands and kidneys appear normal. Symmetric enhancement of both kidneys is noted. No hydronephrosis or renal obstruction is noted. No gallstones are noted. There is no evidence of bowel obstruction. Urinary bladder appears normal. Uterus and ovaries appear normal.  Review of the MIP images confirms the above findings.  IMPRESSION: No evidence of thoracic or abdominal aortic dissection or aneurysm.  Mild wedge compression deformity T12 vertebral body is noted consistent with acute compression fracture.  6 mm nodule is noted laterally in the right lower lobe. Given risk factors for bronchogenic carcinoma,  follow-up chest CT at 6 - 12 months is recommended. This recommendation follows the consensus statement: Guidelines for Management of Small Pulmonary Nodules Detected on CT Scans: A Statement from the Fleischner Society as published in Radiology 2005;237:395-400.   Electronically Signed   By: Roque LiasJames  Green M.D.   On: 05/17/2014 17:21   Ct Cta Abd/pel W/cm &/or W/o Cm  05/17/2014   CLINICAL DATA:  Acute thoracic back pain.  Dizziness.  EXAM: CT ANGIOGRAPHY CHEST, ABDOMEN AND PELVIS  TECHNIQUE: Multidetector CT imaging through the chest, abdomen and pelvis was performed using the standard protocol during bolus administration of intravenous contrast. Multiplanar reconstructed images and MIPs were obtained and reviewed to evaluate the vascular anatomy.  CONTRAST:  100mL OMNIPAQUE IOHEXOL 350 MG/ML SOLN  COMPARISON:  None.  FINDINGS: CTA CHEST FINDINGS  Mild wedge compression deformity of T12 vertebral body is noted consistent with acute compression fracture. No pneumothorax or pleural effusion is  noted. 6 mm nodule is noted laterally in the right lower lobe. No other significant pulmonary parenchymal abnormality is noted. There is no evidence of thoracic aortic dissection or aneurysm. Great vessels are widely patent without significant stenosis. No mediastinal mass or adenopathy is noted. There is no evidence of pulmonary embolus.  Review of the MIP images confirms the above findings.  CTA ABDOMEN AND PELVIS FINDINGS  There is no evidence of abdominal aortic dissection or aneurysm. Iliac arteries are widely patent. The mesenteric and renal arteries are widely patent without significant stenosis. No abnormality is noted in the liver, spleen or pancreas. Adrenal glands and kidneys appear normal. Symmetric enhancement of both kidneys is noted. No hydronephrosis or renal obstruction is noted. No gallstones are noted. There is no evidence of bowel obstruction. Urinary bladder appears normal. Uterus and ovaries appear normal.  Review of the MIP images confirms the above findings.  IMPRESSION: No evidence of thoracic or abdominal aortic dissection or aneurysm.  Mild wedge compression deformity T12 vertebral body is noted consistent with acute compression fracture.  6 mm nodule is noted laterally in the right lower lobe. Given risk factors for bronchogenic carcinoma, follow-up chest CT at 6 - 12 months is recommended. This recommendation follows the consensus statement: Guidelines for Management of Small Pulmonary Nodules Detected on CT Scans: A Statement from the Fleischner Society as published in Radiology 2005;237:395-400.   Electronically Signed   By: Roque LiasJames  Green M.D.   On: 05/17/2014 17:21    EKG: Independently reviewed.  Assessment/Plan Active Problems:   Compression fracture of T12 vertebra   Syncope and collapse   Admit to observation, telemetry Orthostatic vital signs in AM Serial troponins Echo, carotid ultrasounds for hx of syncope  Will need ortho consult in AM for Acute compression  fractures.  Analgesics prn for now.  Restrict activity, including PT orders, until ortho consult.  SSI coverage for hx of diabetes.  Will need PCP referral, follow up imaging for abnormal findings on chest CT  Code Status: FULL Family Communication: Two sons at bedside Disposition Plan: Anticipate home with close outpatient follow-up if stable  Time spent: [address]  Jerene BearsCarter,[place] Arabie Harrison [facility] Pager [phone]  If 7PM-7AM, please contact night-coverage [email or web address] Password [email or web address] 05/17/2014, 8:06 PM

## 2014-05-17 NOTE — ED Notes (Signed)
Pt not wanting to try for UA. Will check back

## 2014-05-17 NOTE — ED Provider Notes (Signed)
CSN: [id number]     Arrival date & time 05/17/14  1521 History   First MD Initiated Contact with Patient 05/17/14 1535     Chief Complaint  Patient presents with  . Loss of Consciousness  . Dizziness    Patient is a 64 y.o. female presenting with syncope and dizziness.  Loss of Consciousness Episode history:  Single Most recent episode:  Today Duration:  3 minutes Context: normal activity and standing up   Witnessed: yes   Relieved by:  Nothing Associated symptoms: dizziness and vomiting   Associated symptoms: no chest pain, no shortness of breath and no weakness   Dizziness Associated symptoms: syncope and vomiting   Associated symptoms: no chest pain and no shortness of breath    Pt was walking/standing looking outside the window.  She turned to walk away, she suddenly passed out without much warning.  She did have some ringing in her ears.  When she woke up she was having pain in her back.  The pain just hurts.  Increases with movement and palpation.  No CP or SOB.  She is having some abdominal pain as well.  No blood in her stool.    No prior history of syncope, heart disease or aneurysm. Past Medical History  Diagnosis Date  . Diabetes mellitus without complication    Past Surgical History  Procedure Laterality Date  . Tubal ligation     Family History  Problem Relation Age of Onset  . Cancer Sister    History  Substance Use Topics  . Smoking status: Current Every Day Smoker -- 0.50 packs/day    Types: Cigarettes  . Smokeless tobacco: Never Used  . Alcohol Use: Yes     Comment: 2-3 times a week   OB History    No data available     Review of Systems  Respiratory: Negative for shortness of breath.   Cardiovascular: Positive for syncope. Negative for chest pain.  Gastrointestinal: Positive for vomiting.  Neurological: Positive for dizziness. Negative for weakness.      Allergies  Sulfonamide derivatives  Home Medications   Prior to Admission medications    Medication Sig Start Date End Date Taking? Authorizing Provider  glipiZIDE (GLUCOTROL) 10 MG tablet Take 1 tablet (10 mg total) by mouth 2 (two) times daily. 05/31/10   Jacquelyn A McGill, MD  hydrocortisone 2.5 % cream Apply topically 2 (two) times daily. to rash     Historical Provider, MD  metFORMIN (GLUCOPHAGE) 1000 MG tablet Take 0.5 tablets (500 mg total) by mouth 2 (two) times daily with meals. 05/31/10   Jacquelyn A McGill, MD   BP 139/67 mmHg  Pulse 74  Resp 0  SpO2 95% Physical Exam  ED Course  Procedures (including critical care time) Labs Review Labs Reviewed  CBC - Abnormal; Notable for the following:    WBC 12.2 (*)    All other components within normal limits  COMPREHENSIVE METABOLIC PANEL - Abnormal; Notable for the following:    Glucose, Bld 213 (*)    All other components within normal limits  CBG MONITORING, ED - Abnormal; Notable for the following:    Glucose-Capillary 187 (*)    All other components within normal limits  I-STAT CHEM 8, ED - Abnormal; Notable for the following:    Glucose, Bld 215 (*)    Calcium, Ion 1.11 (*)    Hemoglobin 15.6 (*)    All other components within normal limits  I-STAT CHEM 8, ED - Abnormal;  Notable for the following:    Glucose, Bld 216 (*)    Calcium, Ion 1.12 (*)    Hemoglobin 15.6 (*)    All other components within normal limits  PROTIME-INR  LIPASE, BLOOD  URINALYSIS, ROUTINE W REFLEX MICROSCOPIC  POCT CBG (FASTING - GLUCOSE)-MANUAL ENTRY  I-STAT TROPOININ, ED  TYPE AND SCREEN  ABO/RH    Imaging Review Ct Head Wo Contrast  05/17/2014   CLINICAL DATA:  Loss of consciousness, dizziness  EXAM: CT HEAD WITHOUT CONTRAST  TECHNIQUE: Contiguous axial images were obtained from the base of the skull through the vertex without intravenous contrast.  COMPARISON:  None.  FINDINGS: No skull fracture is noted. Paranasal sinuses are unremarkable. Partial opacification of left inferior mastoid air cells.  No intracranial hemorrhage,  mass effect or midline shift. Mild cerebral atrophy. No acute cortical infarction. No mass lesion is noted on this unenhanced scan. The gray and white-matter differentiation is preserved.  IMPRESSION: No acute intracranial abnormality.  Mild cerebral atrophy.   Electronically Signed   By: Natasha Mead M.D.   On: 05/17/2014 17:08   Dg Chest Port 1 View  05/17/2014   CLINICAL DATA:  64 year old female with dizziness and syncopal episode. Diabetic.  EXAM: PORTABLE CHEST - 1 VIEW  COMPARISON:  None.  FINDINGS: No infiltrate or congestive heart.  No gross pneumothorax.  Calcified mildly tortuous aorta.  Heart size within normal limits.  No acute osseous abnormality noted.  IMPRESSION: No infiltrate or congestive heart failure.  Calcified slightly tortuous aorta.   Electronically Signed   By: Bridgett Larsson M.D.   On: 05/17/2014 16:18   Ct Angio Chest Aorta W/cm &/or Wo/cm  05/17/2014   CLINICAL DATA:  Acute thoracic back pain.  Dizziness.  EXAM: CT ANGIOGRAPHY CHEST, ABDOMEN AND PELVIS  TECHNIQUE: Multidetector CT imaging through the chest, abdomen and pelvis was performed using the standard protocol during bolus administration of intravenous contrast. Multiplanar reconstructed images and MIPs were obtained and reviewed to evaluate the vascular anatomy.  CONTRAST:  OMNIPAQUE IOHEXOL 350 MG/ML SOLN  COMPARISON:  None.  FINDINGS: CTA CHEST FINDINGS  Mild wedge compression deformity of T12 vertebral body is noted consistent with acute compression fracture. No pneumothorax or pleural effusion is noted. 6 mm nodule is noted laterally in the right lower lobe. No other significant pulmonary parenchymal abnormality is noted. There is no evidence of thoracic aortic dissection or aneurysm. Great vessels are widely patent without significant stenosis. No mediastinal mass or adenopathy is noted. There is no evidence of pulmonary embolus.  Review of the MIP images confirms the above findings.  CTA ABDOMEN AND PELVIS FINDINGS   There is no evidence of abdominal aortic dissection or aneurysm. Iliac arteries are widely patent. The mesenteric and renal arteries are widely patent without significant stenosis. No abnormality is noted in the liver, spleen or pancreas. Adrenal glands and kidneys appear normal. Symmetric enhancement of both kidneys is noted. No hydronephrosis or renal obstruction is noted. No gallstones are noted. There is no evidence of bowel obstruction. Urinary bladder appears normal. Uterus and ovaries appear normal.  Review of the MIP images confirms the above findings.  IMPRESSION: No evidence of thoracic or abdominal aortic dissection or aneurysm.  Mild wedge compression deformity T12 vertebral body is noted consistent with acute compression fracture.  6 mm nodule is noted laterally in the right lower lobe. Given risk factors for bronchogenic carcinoma, follow-up chest CT at 6 - 12 months is recommended. This recommendation follows the  consensus statement: Guidelines for Management of Small Pulmonary Nodules Detected on CT Scans: A Statement from the Fleischner Society as published in Radiology 2005;237:395-400.   Electronically Signed   By: Roque LiasJames  Green M.D.   On: 05/17/2014 17:21   Ct Cta Abd/pel W/cm &/or W/o Cm  05/17/2014   CLINICAL DATA:  Acute thoracic back pain.  Dizziness.  EXAM: CT ANGIOGRAPHY CHEST, ABDOMEN AND PELVIS  TECHNIQUE: Multidetector CT imaging through the chest, abdomen and pelvis was performed using the standard protocol during bolus administration of intravenous contrast. Multiplanar reconstructed images and MIPs were obtained and reviewed to evaluate the vascular anatomy.  CONTRAST:  100mL OMNIPAQUE IOHEXOL 350 MG/ML SOLN  COMPARISON:  None.  FINDINGS: CTA CHEST FINDINGS  Mild wedge compression deformity of T12 vertebral body is noted consistent with acute compression fracture. No pneumothorax or pleural effusion is noted. 6 mm nodule is noted laterally in the right lower lobe. No other  significant pulmonary parenchymal abnormality is noted. There is no evidence of thoracic aortic dissection or aneurysm. Great vessels are widely patent without significant stenosis. No mediastinal mass or adenopathy is noted. There is no evidence of pulmonary embolus.  Review of the MIP images confirms the above findings.  CTA ABDOMEN AND PELVIS FINDINGS  There is no evidence of abdominal aortic dissection or aneurysm. Iliac arteries are widely patent. The mesenteric and renal arteries are widely patent without significant stenosis. No abnormality is noted in the liver, spleen or pancreas. Adrenal glands and kidneys appear normal. Symmetric enhancement of both kidneys is noted. No hydronephrosis or renal obstruction is noted. No gallstones are noted. There is no evidence of bowel obstruction. Urinary bladder appears normal. Uterus and ovaries appear normal.  Review of the MIP images confirms the above findings.  IMPRESSION: No evidence of thoracic or abdominal aortic dissection or aneurysm.  Mild wedge compression deformity T12 vertebral body is noted consistent with acute compression fracture.  6 mm nodule is noted laterally in the right lower lobe. Given risk factors for bronchogenic carcinoma, follow-up chest CT at 6 - 12 months is recommended. This recommendation follows the consensus statement: Guidelines for Management of Small Pulmonary Nodules Detected on CT Scans: A Statement from the Fleischner Society as published in Radiology 2005;237:395-400.   Electronically Signed   By: Roque LiasJames  Green M.D.   On: 05/17/2014 17:21     EKG Interpretation   Date/Time:  Tuesday May 17 2014 15:36:56 EST Ventricular Rate:  72 PR Interval:  161 QRS Duration: 82 QT Interval:  429 QTC Calculation: 469 R Axis:   58 Text Interpretation:  Sinus arrhythmia Consider left ventricular  hypertrophy Since last tracing rate slower Confirmed by Anastasia Tompson  MD-J, Nethaniel Mattie  ([id number]) on 05/17/2014 3:44:20 PM     Medications  0.9 %   sodium chloride infusion (1,000 mLs Intravenous New Bag/Given 05/17/14 1617)  HYDROmorphone (DILAUDID) injection 1 mg (not administered)  ondansetron (ZOFRAN) injection 4 mg (4 mg Intravenous Given 05/17/14 1610)  HYDROmorphone (DILAUDID) injection 1 mg (1 mg Intravenous Given 05/17/14 1611)  iohexol (OMNIPAQUE) 350 MG/ML injection 100 mL (100 mLs Intravenous Contrast Given 05/17/14 1649)     MDM   Final diagnoses:  Pain  Syncope, unspecified syncope type  Compression fracture    The patient's workup does not reveal the etiology for her syncopal episode.  Her CT scan does not show evidence of aneurysm or dissection. She does have a T12 compression fracture accounting for the back pain she is experiencing.  Considering her syncopal  episode, I will consult with medical service regarding admission for cardiac monitoring. Continue medications for pain, consider physical therapy and neurosurgical consult for her compression fracture.    Linwood Dibbles, MD 05/17/14 [phone]

## 2014-05-17 NOTE — ED Notes (Addendum)
Per EMS, pt from  Home, felt dizziness and ringing in her ears and had a syncopal episode.  Woke up with her son picking her up off the floor.  Pt is A&Ox 4.  Reports thoracic back pain.  Worse with palpation per EMS.  Denies neck pain or blood thinner use.  Negative on stroke screen.  CBG-189.  Received 4mg  of zofran IVP en route.

## 2014-05-18 ENCOUNTER — Encounter (HOSPITAL_COMMUNITY): Payer: Self-pay | Admitting: Radiology

## 2014-05-18 DIAGNOSIS — R55 Syncope and collapse: Secondary | ICD-10-CM

## 2014-05-18 DIAGNOSIS — R911 Solitary pulmonary nodule: Secondary | ICD-10-CM | POA: Diagnosis present

## 2014-05-18 DIAGNOSIS — M4854XD Collapsed vertebra, not elsewhere classified, thoracic region, subsequent encounter for fracture with routine healing: Secondary | ICD-10-CM

## 2014-05-18 DIAGNOSIS — M810 Age-related osteoporosis without current pathological fracture: Secondary | ICD-10-CM

## 2014-05-18 DIAGNOSIS — E119 Type 2 diabetes mellitus without complications: Secondary | ICD-10-CM

## 2014-05-18 LAB — BASIC METABOLIC PANEL
Anion gap: 8 (ref 5–15)
BUN: 10 mg/dL (ref 6–23)
CHLORIDE: 106 mmol/L (ref 96–112)
CO2: 24 mmol/L (ref 19–32)
Calcium: 8.5 mg/dL (ref 8.4–10.5)
Creatinine, Ser: 0.61 mg/dL (ref 0.50–1.10)
GFR calc Af Amer: >90 mL/min (ref >90)
Glucose, Bld: 225 mg/dL — ABNORMAL HIGH (ref 70–99)
Potassium: 3.9 mmol/L (ref 3.5–5.1)
Sodium: 138 mmol/L (ref 135–145)

## 2014-05-18 LAB — CBC
HCT: 37.1 % (ref 36.0–46.0)
Hemoglobin: 11.9 g/dL — ABNORMAL LOW (ref 12.0–15.0)
MCH: 31.7 pg (ref 26.0–34.0)
MCHC: 32.1 g/dL (ref 30.0–36.0)
MCV: 98.9 fL (ref 78.0–100.0)
PLATELETS: 264 10*3/uL (ref 150–400)
RBC: 3.75 MIL/uL — ABNORMAL LOW (ref 3.87–5.11)
RDW: 13.2 % (ref 11.5–15.5)
WBC: 11.2 10*3/uL — AB (ref 4.0–10.5)

## 2014-05-18 LAB — GLUCOSE, CAPILLARY
GLUCOSE-CAPILLARY: 132 mg/dL — AB (ref 70–99)
Glucose-Capillary: 154 mg/dL — ABNORMAL HIGH (ref 70–99)
Glucose-Capillary: 234 mg/dL — ABNORMAL HIGH (ref 70–99)

## 2014-05-18 LAB — HEMOGLOBIN A1C
HEMOGLOBIN A1C: 8.9 % — AB (ref <5.7)
MEAN PLASMA GLUCOSE: 209 mg/dL — AB (ref <117)

## 2014-05-18 LAB — TROPONIN I: Troponin I: <0.03 ng/mL (ref <0.031)

## 2014-05-18 LAB — ABO/RH: ABO/RH(D): A POS

## 2014-05-18 MED ORDER — INSULIN ASPART 100 UNIT/ML ~~LOC~~ SOLN: 0.0000 [IU] | Freq: Every day | SUBCUTANEOUS | Status: DC

## 2014-05-18 MED ORDER — GLUCERNA SHAKE PO LIQD
237.0000 mL | Freq: Two times a day (BID) | ORAL | Status: DC
  Administered 2014-05-19: 237 mL via ORAL
  Filled 2014-05-18 (×2): qty 237

## 2014-05-18 MED ORDER — INSULIN ASPART 100 UNIT/ML ~~LOC~~ SOLN
0.0000 [IU] | Freq: Three times a day (TID) | SUBCUTANEOUS | Status: DC
  Administered 2014-05-18: 5 [IU] via SUBCUTANEOUS
  Administered 2014-05-19 (×2): 8 [IU] via SUBCUTANEOUS

## 2014-05-18 NOTE — Progress Notes (Signed)
CARE MANAGEMENT NOTE 05/18/2014  Patient:  Ana Hines,Ana Hines   Account Number:  [id number]  Date Initiated:  05/18/2014  Documentation initiated by:  Trinna BalloonMcGIBBONEY,COOKIE [name]  Subjective/Objective Assessment:   pt admitted with cco weakness, Syncope and collapse with subsequent back pain     Action/Plan:   from home   Anticipated DC Date:  05/20/2014   Anticipated DC Plan:  HOME W HOME HEALTH SERVICES      DC Planning Services  CM consult  [facility]      Choice offered to / List presented to:  C-1 Patient           Status of service:  In process, will continue to follow Medicare Important Message given?   (If response is "NO", the following Medicare IM given date fields will be blank) Date Medicare IM given:   Medicare IM given by:   Date Additional Medicare IM given:   Additional Medicare IM given by:    Discharge Disposition:    Per UR Regulation:  Reviewed for med. necessity/level of care/duration of stay  If discussed at Long Length of Stay Meetings, dates discussed:    Comments:  05/18/14 MMcGibboney, RN, BSN Spoke with pt concerning PCP. Pt states, [facility] Unable to schedule appointment related to [facility] at [facility], will continue to call or give telephone number to to call.  Pt was not sure of HH at present time or going to SNF.

## 2014-05-18 NOTE — Progress Notes (Signed)
VASCULAR LAB PRELIMINARY  PRELIMINARY  PRELIMINARY  PRELIMINARY  Carotid duplex completed.    Preliminary report:  Bilateral:  1-39% ICA stenosis.  Vertebral artery flow is antegrade.     Vaneza Pickart, RVS 05/18/2014, 2:46 PM

## 2014-05-18 NOTE — Progress Notes (Signed)
INITIAL NUTRITION ASSESSMENT  DOCUMENTATION CODES Per approved criteria  -Underweight   INTERVENTION: -Provide Glucerna Shake po BID, each supplement provides 220 kcal and 10 grams of protein -Encourage PO intake -RD to continue to monitor  NUTRITION DIAGNOSIS: Underweight related to poor appetite as evidenced by BMI of 14.9.   Goal: Pt to meet >/= 90% of their estimated nutrition needs   Monitor:  PO and supplemental intake, weight, labs, I/O's  Reason for Assessment: Pt identified as at nutrition risk on the Malnutrition Screen Tool  Admitting Dx: <principal problem not specified>  ASSESSMENT: [age] y.o. female who [facility] after an unwitnessed fall with possible syncope.  Spoke with pt at bedside, pt c/o pain and experiencing a headache. Pt did not feel like talking to RD at this time, as "so many people have woken me up today".  Pt was able to state that her appetite has been poor but she has always eaten very little hence her weight. This has been the case for years. Per weight history pt's weight is stable around 100-110 lb. PO intake: 0%.   Unable to perform nutrition focused physical exam as pt was wanting to sleep.  Pt would benefit from nutritional supplement, RD to order Glucerna BID.  Labs reviewed:  Glucose 225  Height: Ht Readings from Last 1 Encounters:  05/18/14 5\' 9"  (1.753 m)    Weight: Wt Readings from Last 1 Encounters:  05/17/14 101 lb 1.6 oz (45.859 kg)    Ideal Body Weight: 145 lb  % Ideal Body Weight: 70%  Wt Readings from Last 10 Encounters:  05/17/14 101 lb 1.6 oz (45.859 kg)  05/25/10 111 lb 3.2 oz (50.44 kg)  04/18/10 112 lb (50.803 kg)  11/20/09 108 lb (48.988 kg)  02/08/09 107 lb 14.4 oz (48.943 kg)  07/20/08 112 lb 8 oz (51.03 kg)  12/21/07 105 lb 11.2 oz (47.945 kg)  06/11/07 109 lb 8 oz (49.669 kg)    Usual Body Weight: 100-110 lb  % Usual Body Weight: 100%  BMI:  Body mass index is 14.92  kg/(m^2).  Estimated Nutritional Needs: Kcal: 1400-1600 Protein: 65-75g Fluid: 1.5L/day  Skin: intact  Diet Order: Diet Carb Modified  EDUCATION NEEDS: -No education needs identified at this time  No intake or output data in the 24 hours ending 05/18/14 1238  Last BM: 1/26  Labs:   Recent Labs Lab 05/17/14 1557 05/17/14 1602 05/18/14 0208  NA 137 140 138  K 4.2 4.0 3.9  CL 102 103 106  CO2 26  --  24  BUN 9 8 10   CREATININE 0.69 0.60 0.61  CALCIUM 9.6  --  8.5  GLUCOSE 213* 216* 225*    CBG (last 3)   Recent Labs  05/17/14 2014 05/18/14 0833 05/18/14 1132  GLUCAP 222* 154* 132*    Scheduled Meds: . enoxaparin (LOVENOX) injection  40 mg Subcutaneous Q24H  . insulin aspart  0-9 Units Subcutaneous TID WC    Continuous Infusions:   Past Medical History  Diagnosis Date  . Diabetes mellitus without complication   . Hyperlipidemia   . Osteoporosis     Past Surgical History  Procedure Laterality Date  . Tubal ligation      Tilda FrancoLindsey Ilea Hilton, MS, RD, LDN Pager: [phone] After Hours Pager: [phone]

## 2014-05-18 NOTE — Progress Notes (Signed)
Echocardiogram 2D Echocardiogram has been performed.  Dorothey BasemanReel, Mairyn Lenahan M 05/18/2014, 10:39 AM

## 2014-05-18 NOTE — Progress Notes (Signed)
UR completed 

## 2014-05-18 NOTE — Progress Notes (Signed)
Report given and care assumed of Pt. No changes noted with Pt's assessment at this time  

## 2014-05-18 NOTE — Progress Notes (Addendum)
Progress Note   Ana Hines HYQ:[id number] DOB: March 07, 1951 DOA: 05/17/2014 PCP: No primary care provider on file.   Brief Narrative:   Ana Hines is an 64 y.o. female was admitted after an unwitnessed fall on 05/17/14 with possible syncope. Initial workup included a CT of the head which was was negative for acute findings. Patient had significant back pain and radiographs show an acute T12 compression fracture.  Assessment/Plan:   Principal Problem:   Syncope and collapse  No arrhythmic events noted on telemetry.  2-D echo showed EF 55-60 percent with no regional wall motion abnormalities and no abnormal diastolic function parameters however a thin mobile stranded density in the RA was noted and recommendations were to pursue a TEE or cardiac MRI for better delineation.  Echo reviewed by Dr. Elease Hashimoto who feels that this does not need to be evaluated further.  Troponins negative.  Follow up carotid Dopplers.  PT evaluation when stable.  Active Problems:   Lung nodule  Will need repeat CT scan in 6-12 months.    Type 2 diabetes mellitus  Takes glipizide and Glucophage at home.  Currently on insulin sensitive SSI 3 times a day.  CBGs 132-222. Change to moderate scale.    Osteoporosis/Compression fracture of T12 vertebra  Interventional radiology has evaluated the patient, who is considering proceeding with vertebroplasty.  Continue pain relief efforts.    DVT Prophylaxis  Continue Lovenox.  Code Status: Full. Family Communication: Sons at the bedside. Disposition Plan: Home when stable.   IV Access:    Peripheral IV   Procedures and diagnostic studies:   Ct Head Wo Contrast 05/17/2014: No acute intracranial abnormality.  Mild cerebral atrophy.     Dg Chest Port 1 View 05/17/2014: No infiltrate or congestive heart failure.  Calcified slightly tortuous aorta.    Ct Angio Chest Aorta W/cm &/or Wo/cm 05/17/2014: Mild wedge compression deformity of T12  vertebral body is noted consistent with acute compression fracture. No pneumothorax or pleural effusion is noted. 6 mm nodule is noted laterally in the right lower lobe. Given risk factors for bronchogenic carcinoma, follow-up chest CT at 6 - 12 months is recommended.  No other significant pulmonary parenchymal abnormality is noted. There is no evidence of thoracic aortic dissection or aneurysm. Great vessels are widely patent without significant stenosis. No mediastinal mass or adenopathy is noted. There is no evidence of pulmonary embolus.  Review of the MIP images confirms the above findings.    Ct Cta Abd/pel W/cm &/or W/o Cm 05/17/2014: There is no evidence of abdominal aortic dissection or aneurysm. Iliac arteries are widely patent. The mesenteric and renal arteries are widely patent without significant stenosis. No abnormality is noted in the liver, spleen or pancreas. Adrenal glands and kidneys appear normal. Symmetric enhancement of both kidneys is noted. No hydronephrosis or renal obstruction is noted. No gallstones are noted. There is no evidence of bowel obstruction. Urinary bladder appears normal. Uterus and ovaries appear normal.  Review of the MIP images confirms the above findings.   2-D echocardiogram 05/18/14: - Left ventricle: LV strain is normal at -21% The cavity size was normal. Wall thickness was normal. Systolic function was normal. The estimated ejection fraction was in the range of 55% to 60%. Wall motion was normal; there were no regional wall motion abnormalities. Left ventricular diastolic function parameters were normal. - Aortic valve: Moderate thickening and calcification, consistent with sclerosis. There was trivial regurgitation. - Mitral valve: There was trivial regurgitation. -  Right atrium: There is a thin mobile stranded density in the RA that may represent a Chiari network but in the subcostal view appears as a mobile septation. Recommend TEE or  Cardiac MRI for better delineation. - Atrial septum: No defect or patent foramen ovale was identified.  Medical Consultants:    Dr. Delane Ginger, Cardiology, telephone consult only.  Interventional Radiology  Anti-Infectives:    None.  Subjective:   [name] report significant back pain, especially with movement. She tells me she had ringing in her ears before she had her syncopal event yesterday. Nursing staff reports that she has not been orthostatic. She has had some nausea and dry heaves.  Objective:    Filed Vitals:   05/18/14 0911 05/18/14 0912 05/18/14 0913 05/18/14 1304  BP: 136/59 148/82 151/82 139/54  Pulse: 57 74 88 64  Temp:    98.4 F (36.9 C)  TempSrc:    Oral  Resp:    16  Height:      Weight:      SpO2:    100%    Intake/Output Summary (Last 24 hours) at 05/18/14 1543 Last data filed at 05/18/14 1331  Gross per 24 hour  Intake    215 ml  Output      0 ml  Net    215 ml    Exam: Gen:  NAD Cardiovascular:  RRR, No M/R/G Respiratory:  Lungs CTAB Gastrointestinal:  Abdomen soft, NT/ND, + BS Extremities:  No C/E/C   Data Reviewed:    Labs: Basic Metabolic Panel:  Recent Labs Lab 05/17/14 1537 05/17/14 1557 05/17/14 1602 05/18/14 0208  NA 139 137 140 138  K 4.1 4.2 4.0 3.9  CL 102 102 103 106  CO2  --  26  --  24  GLUCOSE 215* 213* 216* 225*  BUN CREATININE 0.70 0.69 0.60 0.61  CALCIUM  --  9.6  --  8.5   GFR Estimated Creatinine Clearance: 52.8 mL/min (by C-G formula based on Cr of 0.61). Liver Function Tests:  Recent Labs Lab 05/17/14 1557  AST 26  ALT 17  ALKPHOS 89  BILITOT 0.6  PROT 8.0  ALBUMIN 4.4    Recent Labs Lab 05/17/14 1557  LIPASE 26   Coagulation profile  Recent Labs Lab 05/17/14 1557  INR 0.93    CBC:  Recent Labs Lab 05/17/14 1528 05/17/14 1537 05/17/14 1602 05/18/14 0208  WBC 12.2*  --   --  11.2*  HGB 13.5 15.6* 15.6* 11.9*  HCT 41.2 46.0 46.0 37.1  MCV 98.8   --   --  98.9  PLT 316  --   --  264   Cardiac Enzymes:  Recent Labs Lab 05/17/14 2009 05/18/14 0208 05/18/14 0745  TROPONINI <0.03 <0.03 <0.03   CBG:  Recent Labs Lab 05/17/14 1539 05/17/14 2014 05/18/14 0833 05/18/14 1132  GLUCAP 187* 222* 154* 132*   Hgb A1c:  Recent Labs  05/17/14 1710  HGBA1C 8.9*   Microbiology No results found for this or any previous visit (from the past 240 hour(s)).   Medications:   . enoxaparin (LOVENOX) injection  40 mg Subcutaneous Q24H  . insulin aspart  0-9 Units Subcutaneous TID WC   Continuous Infusions:   Time spent: 35 minutes with > 50% of time discussing current diagnostic test results, clinical impression and plan of care.   LOS: 1 day   RAMA,CHRISTINA  [facility] Pager [phone]. If unable to reach me  by pager, please call my cell phone at [phone].  *Please refer to [email or web address], password [email or web address] to get updated schedule on who will round on this patient, as hospitalists switch teams weekly. If 7PM-7AM, please contact night-coverage at [email or web address], password [email or web address] for any overnight needs.  05/18/2014, 3:43 PM

## 2014-05-18 NOTE — H&P (Signed)
Reason for Consult: Acute T12 Compression fracture with uncontrolled back pain. Chief Complaint: Chief Complaint  Patient presents with  . Loss of Consciousness  . Dizziness    Referring Physician(s): [facility]-Dr. Rama  History of Present Illness: Ana Hines is a 64 y.o. female who [facility] 05/17/14 after an unwitnessed fall with possible syncope, CT head done with acute findings and CTA done with no evidence of dissection. Further cardiac w/u is being done including troponins, carotid doppler, echocardiogram and ECG. She states her son helped pick her up off the floor and she experienced severe back pain. She c/o 10/10 current mid to lower back pain and nausea with movement. She states the pain is constant, but exacerbated with any movement. She is receiving dilaudid 0.5 mg IV q 6hrs and Vicodin q 4 hrs with some relief. She denies any loss of bowel or bladder function. She denies any loss of strength or sensation in her LE bilaterally. IR received request for possible VP/KP, this has been discussed at length today with the patient and she would like to continue with pain medication over the next day or so and see how much her pain improves prior to moving forward with the procedure.   Past Medical History  Diagnosis Date  . Diabetes mellitus without complication   . Hyperlipidemia   . Osteoporosis     Past Surgical History  Procedure Laterality Date  . Tubal ligation      Allergies: Sulfonamide derivatives  Medications: Prior to Admission medications   Medication Sig Start Date End Date Taking? Authorizing Provider  glipiZIDE (GLUCOTROL) 10 MG tablet Take 1 tablet (10 mg total) by mouth 2 (two) times daily. 05/31/10   Jacquelyn A McGill, MD  hydrocortisone 2.5 % cream Apply topically 2 (two) times daily. to rash     Historical Provider, MD  metFORMIN (GLUCOPHAGE) 1000 MG tablet Take 0.5 tablets (500 mg total) by mouth 2 (two) times daily with meals. 05/31/10   Tito DineJacquelyn A  McGill, MD    Family History  Problem Relation Age of Onset  . Breast cancer Sister   . Ovarian cancer Cousin     History   Social History  . Marital Status: Divorced    Spouse Name: N/A    Number of Children: N/A  . Years of Education: N/A   Social History Main Topics  . Smoking status: Current Every Day Smoker -- 0.50 packs/day    Types: Cigarettes  . Smokeless tobacco: Never Used  . Alcohol Use: 0.0 oz/week    0 Not specified per week     Comment: 2-3 times a week  . Drug Use: Yes    Special: Marijuana     Comment: Occasionally  . Sexual Activity: None   Other Topics Concern  . None   Social History Narrative   Review of Systems: A 12 point ROS discussed and pertinent positives are indicated in the HPI above.  All other systems are negative.  Review of Systems  Gastrointestinal: Positive for nausea.  Musculoskeletal: Positive for back pain.  Neurological: Negative for speech difficulty and numbness.    Vital Signs: BP 139/54 mmHg  Pulse 64  Temp(Src) 98.4 F (36.9 C) (Oral)  Resp 16  Ht 5\' 9"  (1.753 m)  Wt 101 lb 1.6 oz (45.859 kg)  BMI 14.92 kg/m2  SpO2 100%  Physical Exam  Constitutional: She is oriented to person, place, and time. No distress.  HENT:  Head: Normocephalic and atraumatic.  Neck: No tracheal deviation present.  Cardiovascular: Normal rate and regular rhythm.  Exam reveals no gallop and no friction rub.   No murmur heard. Pulmonary/Chest: Effort normal and breath sounds normal. No respiratory distress. She has no wheezes. She has no rales.  Abdominal: Soft. Bowel sounds are normal. She exhibits no distension. There is no tenderness.  BACK: T12 region TTP, no skin breakdown or changes.  Musculoskeletal: Normal range of motion. She exhibits no edema or tenderness.  Neurological: She is alert and oriented to person, place, and time.  LE neurologically intact with 5/5 strength bilaterally, sensation and temperature intact  Skin: Skin is  warm and dry. She is not diaphoretic.  Psychiatric: She has a normal mood and affect. Her behavior is normal.    Imaging: Ct Head Wo Contrast  05/17/2014   CLINICAL DATA:  Loss of consciousness, dizziness  EXAM: CT HEAD WITHOUT CONTRAST  TECHNIQUE: Contiguous axial images were obtained from the base of the skull through the vertex without intravenous contrast.  COMPARISON:  None.  FINDINGS: No skull fracture is noted. Paranasal sinuses are unremarkable. Partial opacification of left inferior mastoid air cells.  No intracranial hemorrhage, mass effect or midline shift. Mild cerebral atrophy. No acute cortical infarction. No mass lesion is noted on this unenhanced scan. The gray and white-matter differentiation is preserved.  IMPRESSION: No acute intracranial abnormality.  Mild cerebral atrophy.   Electronically Signed   By: Natasha Mead M.D.   On: 05/17/2014 17:08   Dg Chest Port 1 View  05/17/2014   CLINICAL DATA:  64 year old female with dizziness and syncopal episode. Diabetic.  EXAM: PORTABLE CHEST - 1 VIEW  COMPARISON:  None.  FINDINGS: No infiltrate or congestive heart.  No gross pneumothorax.  Calcified mildly tortuous aorta.  Heart size within normal limits.  No acute osseous abnormality noted.  IMPRESSION: No infiltrate or congestive heart failure.  Calcified slightly tortuous aorta.   Electronically Signed   By: Bridgett Larsson M.D.   On: 05/17/2014 16:18   Ct Angio Chest Aorta W/cm &/or Wo/cm  05/17/2014   CLINICAL DATA:  Acute thoracic back pain.  Dizziness.  EXAM: CT ANGIOGRAPHY CHEST, ABDOMEN AND PELVIS  TECHNIQUE: Multidetector CT imaging through the chest, abdomen and pelvis was performed using the standard protocol during bolus administration of intravenous contrast. Multiplanar reconstructed images and MIPs were obtained and reviewed to evaluate the vascular anatomy.  CONTRAST:  OMNIPAQUE IOHEXOL 350 MG/ML SOLN  COMPARISON:  None.  FINDINGS: CTA CHEST FINDINGS  Mild wedge compression  deformity of T12 vertebral body is noted consistent with acute compression fracture. No pneumothorax or pleural effusion is noted. 6 mm nodule is noted laterally in the right lower lobe. No other significant pulmonary parenchymal abnormality is noted. There is no evidence of thoracic aortic dissection or aneurysm. Great vessels are widely patent without significant stenosis. No mediastinal mass or adenopathy is noted. There is no evidence of pulmonary embolus.  Review of the MIP images confirms the above findings.  CTA ABDOMEN AND PELVIS FINDINGS  There is no evidence of abdominal aortic dissection or aneurysm. Iliac arteries are widely patent. The mesenteric and renal arteries are widely patent without significant stenosis. No abnormality is noted in the liver, spleen or pancreas. Adrenal glands and kidneys appear normal. Symmetric enhancement of both kidneys is noted. No hydronephrosis or renal obstruction is noted. No gallstones are noted. There is no evidence of bowel obstruction. Urinary bladder appears normal. Uterus and ovaries appear normal.  Review  of the MIP images confirms the above findings.  IMPRESSION: No evidence of thoracic or abdominal aortic dissection or aneurysm.  Mild wedge compression deformity T12 vertebral body is noted consistent with acute compression fracture.  6 mm nodule is noted laterally in the right lower lobe. Given risk factors for bronchogenic carcinoma, follow-up chest CT at 6 - 12 months is recommended. This recommendation follows the consensus statement: Guidelines for Management of Small Pulmonary Nodules Detected on CT Scans: A Statement from the Fleischner Society as published in Radiology 2005;237:395-400.   Electronically Signed   By: Roque Lias M.D.   On: 05/17/2014 17:21   Ct Cta Abd/pel W/cm &/or W/o Cm  05/17/2014   CLINICAL DATA:  Acute thoracic back pain.  Dizziness.  EXAM: CT ANGIOGRAPHY CHEST, ABDOMEN AND PELVIS  TECHNIQUE: Multidetector CT imaging through the  chest, abdomen and pelvis was performed using the standard protocol during bolus administration of intravenous contrast. Multiplanar reconstructed images and MIPs were obtained and reviewed to evaluate the vascular anatomy.  CONTRAST:  OMNIPAQUE IOHEXOL 350 MG/ML SOLN  COMPARISON:  None.  FINDINGS: CTA CHEST FINDINGS  Mild wedge compression deformity of T12 vertebral body is noted consistent with acute compression fracture. No pneumothorax or pleural effusion is noted. 6 mm nodule is noted laterally in the right lower lobe. No other significant pulmonary parenchymal abnormality is noted. There is no evidence of thoracic aortic dissection or aneurysm. Great vessels are widely patent without significant stenosis. No mediastinal mass or adenopathy is noted. There is no evidence of pulmonary embolus.  Review of the MIP images confirms the above findings.  CTA ABDOMEN AND PELVIS FINDINGS  There is no evidence of abdominal aortic dissection or aneurysm. Iliac arteries are widely patent. The mesenteric and renal arteries are widely patent without significant stenosis. No abnormality is noted in the liver, spleen or pancreas. Adrenal glands and kidneys appear normal. Symmetric enhancement of both kidneys is noted. No hydronephrosis or renal obstruction is noted. No gallstones are noted. There is no evidence of bowel obstruction. Urinary bladder appears normal. Uterus and ovaries appear normal.  Review of the MIP images confirms the above findings.  IMPRESSION: No evidence of thoracic or abdominal aortic dissection or aneurysm.  Mild wedge compression deformity T12 vertebral body is noted consistent with acute compression fracture.  6 mm nodule is noted laterally in the right lower lobe. Given risk factors for bronchogenic carcinoma, follow-up chest CT at 6 - 12 months is recommended. This recommendation follows the consensus statement: Guidelines for Management of Small Pulmonary Nodules Detected on CT Scans: A  Statement from the Fleischner Society as published in Radiology 2005;237:395-400.   Electronically Signed   By: Roque Lias M.D.   On: 05/17/2014 17:21    Labs:  CBC:  Recent Labs  05/17/14 1528 05/17/14 1537 05/17/14 1602 05/18/14 0208  WBC 12.2*  --   --  11.2*  HGB 13.5 15.6* 15.6* 11.9*  HCT 41.2 46.0 46.0 37.1  PLT 316  --   --  264    COAGS:  Recent Labs  05/17/14 1557  INR 0.93    BMP:  Recent Labs  05/17/14 1537 05/17/14 1557 05/17/14 1602 05/18/14 0208  NA 139 137 140 138  K 4.1 4.2 4.0 3.9  CL 102 102 103 106  CO2  --  26  --  24  GLUCOSE 215* 213* 216* 225*  BUN CALCIUM  --  9.6  --  8.5  CREATININE 0.70 0.69 0.60 0.61  GFRNONAA  --  >90  --  >90  GFRAA  --  >90  --  >90    LIVER FUNCTION TESTS:  Recent Labs  05/17/14 1557  BILITOT 0.6  AST 26  ALT 17  ALKPHOS 89  PROT 8.0  ALBUMIN 4.4    Assessment and Plan: S/p unwitnessed fall 05/17/14 with possible syncope, CT head negative for acute findings, cardiac w/u being done Back pain with acute T12 compression fracture seen on CTA, currently receiving Dilaudid and Vicodin with some relief Request for consult for T12 fracture management.  Risks and Benefits of T12 VP/KP discussed with the patient today. All of the patient's questions were answered, the patient would like to continue with pain medication for a couple days and decide on procedure at that time depending on her pain level.  IR will follow.    Thank you for this interesting consult.  I greatly enjoyed meeting Ana Hines and look forward to participating in their care.  SignedBerneta Levins 05/18/2014, 2:51 PM   I spent a total of 40 minutes face to face in clinical consultation, greater than 50% of which was counseling/coordinating care

## 2014-05-19 DIAGNOSIS — E1165 Type 2 diabetes mellitus with hyperglycemia: Secondary | ICD-10-CM

## 2014-05-19 LAB — GLUCOSE, CAPILLARY
Glucose-Capillary: 184 mg/dL — ABNORMAL HIGH (ref 70–99)
Glucose-Capillary: 266 mg/dL — ABNORMAL HIGH (ref 70–99)
Glucose-Capillary: 224 mg/dL — ABNORMAL HIGH (ref 70–99)

## 2014-05-19 MED ORDER — METFORMIN HCL 500 MG PO TABS: 500.0000 mg | ORAL_TABLET | Freq: Two times a day (BID) | ORAL | Status: DC

## 2014-05-19 MED ORDER — POLYETHYLENE GLYCOL 3350 17 G PO PACK
17.0000 g | PACK | Freq: Every day | ORAL | Status: DC
  Administered 2014-05-19: 17 g via ORAL
  Filled 2014-05-19: qty 1

## 2014-05-19 MED ORDER — MECLIZINE HCL 25 MG PO TABS: 25.0000 mg | ORAL_TABLET | Freq: Three times a day (TID) | ORAL | Status: DC | PRN

## 2014-05-19 MED ORDER — GLIPIZIDE 10 MG PO TABS
10.0000 mg | ORAL_TABLET | Freq: Two times a day (BID) | ORAL | Status: DC
  Administered 2014-05-19: 10 mg via ORAL
  Filled 2014-05-19 (×2): qty 1

## 2014-05-19 MED ORDER — METFORMIN HCL 500 MG PO TABS
500.0000 mg | ORAL_TABLET | Freq: Two times a day (BID) | ORAL | Status: DC
  Administered 2014-05-19: 500 mg via ORAL
  Filled 2014-05-19 (×2): qty 1

## 2014-05-19 MED ORDER — GLUCERNA SHAKE PO LIQD: 237.0000 mL | Freq: Two times a day (BID) | ORAL | Status: DC

## 2014-05-19 MED ORDER — POLYETHYLENE GLYCOL 3350 17 G PO PACK: 17.0000 g | PACK | Freq: Every day | ORAL | Status: DC

## 2014-05-19 MED ORDER — OXYCODONE HCL 5 MG PO TABS
10.0000 mg | ORAL_TABLET | ORAL | Status: DC | PRN
  Administered 2014-05-19 (×2): 15 mg via ORAL
  Filled 2014-05-19 (×2): qty 3

## 2014-05-19 MED ORDER — KETOROLAC TROMETHAMINE 30 MG/ML IJ SOLN: 30.0000 mg | Freq: Four times a day (QID) | INTRAMUSCULAR | Status: DC | PRN

## 2014-05-19 MED ORDER — ONDANSETRON 4 MG PO TBDP: 4.0000 mg | ORAL_TABLET | Freq: Three times a day (TID) | ORAL | Status: DC | PRN

## 2014-05-19 MED ORDER — OXYCODONE HCL 10 MG PO TABS: 10.0000 mg | ORAL_TABLET | ORAL | Status: DC | PRN

## 2014-05-19 MED ORDER — GLIPIZIDE 10 MG PO TABS: 10.0000 mg | ORAL_TABLET | Freq: Two times a day (BID) | ORAL | Status: DC

## 2014-05-19 NOTE — Evaluation (Signed)
Physical Therapy Evaluation Patient Details Name: Ana Hines MRN: [id number] DOB: August 06, 1950 Today's Date: 05/19/2014   History of Present Illness  64 y.o. female admitted after an unwitnessed fall on 05/17/14 with possible syncope. Initial workup included a CT of the head which was was negative for acute findings. Patient admitted with significant back pain and radiographs show an acute T12 compression fracture.  Pt declines vertebroplasty at this time  Clinical Impression  Pt admitted with above diagnosis. Pt currently with functional limitations due to the deficits listed below (see PT Problem List).  Pt educated on back precautions and log roll technique to assist with pain control.  Pt reports she can have 24/7 supervision/assist from family and friends upon return home.  Pt agreed to have someone present for mobility and use RW to stabilize for safety upon d/c as well.  If pt remains in acute care, pt will benefit from skilled PT to increase their independence and safety with mobility to allow discharge to the venue listed below.       Follow Up Recommendations Home health PT;Supervision for mobility/OOB    Equipment Recommendations  Rolling walker with 5" wheels    Recommendations for Other Services       Precautions / Restrictions Precautions Precautions: Back Precaution Comments: back precautions and log roll technique discussed for pain control      Mobility  Bed Mobility Overal bed mobility: Needs Assistance Bed Mobility: Rolling;Sidelying to Sit;Sit to Sidelying Rolling: Supervision Sidelying to sit: Supervision     Sit to sidelying: Supervision General bed mobility comments: verbal and tactile cues for log roll technique  Transfers Overall transfer level: Needs assistance Equipment used: None Transfers: Sit to/from Stand Sit to Stand: Min guard         General transfer comment: min/guard for safety   Ambulation/Gait Ambulation/Gait assistance: Min  assist Ambulation Distance (Feet): 120 Feet Assistive device: None Gait Pattern/deviations: Step-through pattern;Narrow base of support;Drifts right/left Gait velocity: decreased   General Gait Details: occasional assist to steady, pt with nausea (given IV nausea meds just prior to mobility) and required rest break halfway, pt agreeable upon return to bed to use RW for more stability, reports back pain present but under control  Stairs            Wheelchair Mobility    Modified Rankin (Stroke Patients Only)       Balance                                             Pertinent Vitals/Pain Pain Assessment: 0-10 Pain Score: 2  Pain Location: back Pain Descriptors / Indicators: Sore Pain Intervention(s): Limited activity within patient's tolerance;Monitored during session;Premedicated before session    Home Living Family/patient expects to be discharged to:: Private residence Living Arrangements: Children Available Help at Discharge: Family;Friend(s);Available 24 hours/day Type of Home: House       Home Layout: One level Home Equipment: None      Prior Function Level of Independence: Independent               Hand Dominance        Extremity/Trunk Assessment               Lower Extremity Assessment: Generalized weakness         Communication   Communication: No difficulties  Cognition Arousal/Alertness: Awake/alert Behavior During Therapy:  WFL for tasks assessed/performed Overall Cognitive Status: Within Functional Limits for tasks assessed                      General Comments      Exercises        Assessment/Plan    PT Assessment Patient needs continued PT services  PT Diagnosis Difficulty walking;Acute pain   PT Problem List Decreased mobility;Pain;Decreased knowledge of use of DME;Decreased activity tolerance  PT Treatment Interventions DME instruction;Gait training;Functional mobility  training;Therapeutic activities;Therapeutic exercise;Patient/family education   PT Goals (Current goals can be found in the Care Plan section) Acute Rehab PT Goals PT Goal Formulation: With patient Time For Goal Achievement: 05/26/14 Potential to Achieve Goals: Good    Frequency Min 3X/week   Barriers to discharge        Co-evaluation               End of Session Equipment Utilized During Treatment: Gait belt Activity Tolerance: Other (comment) (limited by nausea) Patient left: with call bell/phone within reach;in bed;with family/visitor present      Functional Assessment Tool Used: clinical judgement Functional Limitation: Mobility: Walking and moving around Mobility: Walking and Moving Around Current Status ([id number](G8978): At least 20 percent but less than 40 percent impaired, limited or restricted Mobility: Walking and Moving Around Goal Status [phone]([id number]): At least 1 percent but less than 20 percent impaired, limited or restricted    Time: 1443-1458 PT Time Calculation (min) (ACUTE ONLY): 15 min   Charges:   PT Evaluation $Initial PT Evaluation Tier I: 1 Procedure     PT G Codes:   PT G-Codes **NOT FOR INPATIENT CLASS** Functional Assessment Tool Used: clinical judgement Functional Limitation: Mobility: Walking and moving around Mobility: Walking and Moving Around Current Status ([id number](G8978): At least 20 percent but less than 40 percent impaired, limited or restricted Mobility: Walking and Moving Around Goal Status [phone]([id number]): At least 1 percent but less than 20 percent impaired, limited or restricted    Retal Tonkinson,KATHrine E 05/19/2014, 3:52 PM Zenovia JarredKati Ladon Vandenberghe, PT, DPT 05/19/2014 Pager: [phone]

## 2014-05-19 NOTE — Discharge Instructions (Signed)
Musculoskeletal Pain Musculoskeletal pain is muscle and boney aches and pains. These pains can occur in any part of the body. Your caregiver may treat you without knowing the cause of the pain. They may treat you if blood or urine tests, X-rays, and other tests were normal.  CAUSES There is often not a definite cause or reason for these pains. These pains may be caused by a type of germ (virus). The discomfort may also come from overuse. Overuse includes working out too hard when your body is not fit. Boney aches also come from weather changes. Bone is sensitive to atmospheric pressure changes. HOME CARE INSTRUCTIONS   Ask when your test results will be ready. Make sure you get your test results.  Only take over-the-counter or prescription medicines for pain, discomfort, or fever as directed by your caregiver. If you were given medications for your condition, do not drive, operate machinery or power tools, or sign legal documents for 24 hours. Do not drink alcohol. Do not take sleeping pills or other medications that may interfere with treatment.  Continue all activities unless the activities cause more pain. When the pain lessens, slowly resume normal activities. Gradually increase the intensity and duration of the activities or exercise.  During periods of severe pain, bed rest may be helpful. Lay or sit in any position that is comfortable.  Putting ice on the injured area.  Put ice in a bag.  Place a towel between your skin and the bag.  Leave the ice on for 15 to 20 minutes, 3 to 4 times a day.  Follow up with your caregiver for continued problems and no reason can be found for the pain. If the pain becomes worse or does not go away, it may be necessary to repeat tests or do additional testing. Your caregiver may need to look further for a possible cause. SEEK IMMEDIATE MEDICAL CARE IF:  You have pain that is getting worse and is not relieved by medications.  You develop chest pain  that is associated with shortness or breath, sweating, feeling sick to your stomach (nauseous), or throw up (vomit).  Your pain becomes localized to the abdomen.  You develop any new symptoms that seem different or that concern you. MAKE SURE YOU:   Understand these instructions.  Will watch your condition.  Will get help right away if you are not doing well or get worse. Document Released: 04/08/2005 Document Revised: 07/01/2011 Document Reviewed: 12/11/2012 [facility] Patient Information 2015 [place], [place]. This information is not intended to replace advice given to you by your health care provider. Make sure you discuss any questions you have with your health care provider.  Near-Syncope Near-syncope (commonly known as near fainting) is sudden weakness, dizziness, or feeling like you might pass out. During an episode of near-syncope, you may also develop pale skin, have tunnel vision, or feel sick to your stomach (nauseous). Near-syncope may occur when getting up after sitting or while standing for a long time. It is caused by a sudden decrease in blood flow to the brain. This decrease can result from various causes or triggers, most of which are not serious. However, because near-syncope can sometimes be a sign of something serious, a medical evaluation is required. The specific cause is often not determined. HOME CARE INSTRUCTIONS  Monitor your condition for any changes. The following actions may help to alleviate any discomfort you are experiencing:  Have someone stay with you until you feel stable.  Lie down right away  and prop your feet up if you start feeling like you might faint. Breathe deeply and steadily. Wait until all the symptoms have passed. Most of these episodes last only a few minutes. You may feel tired for several hours.   Drink enough fluids to keep your urine clear or pale yellow.   If you are taking blood pressure or heart medicine, get up slowly when seated or  lying down. Take several minutes to sit and then stand. This can reduce dizziness.  Follow up with your health care provider as directed. SEEK IMMEDIATE MEDICAL CARE IF:   You have a severe headache.   You have unusual pain in the chest, abdomen, or back.   You are bleeding from the mouth or rectum, or you have black or tarry stool.   You have an irregular or very fast heartbeat.   You have repeated fainting or have seizure-like jerking during an episode.   You faint when sitting or lying down.   You have confusion.   You have difficulty walking.   You have severe weakness.   You have vision problems.  MAKE SURE YOU:   Understand these instructions.  Will watch your condition.  Will get help right away if you are not doing well or get worse. Document Released: 04/08/2005 Document Revised: 04/13/2013 Document Reviewed: 09/11/2012 [facility] Patient Information 2015 [place]. This information is not intended to replace advice given to you by your health care provider. Make sure you discuss any questions you have with your health care provider.

## 2014-05-19 NOTE — Progress Notes (Signed)
UR completed 

## 2014-05-19 NOTE — Progress Notes (Signed)
Progress Note   Ana Hines ZOX:[id number] DOB: 06-04-1950 DOA: 05/17/2014 PCP: No primary care provider on file.   Brief Narrative:   Ana Hines is an 64 y.o. female was admitted after an unwitnessed fall on 05/17/14 with possible syncope. Initial workup included a CT of the head which was was negative for acute findings. Patient had significant back pain and radiographs show an acute T12 compression fracture.  Assessment/Plan:   Principal Problem:   Syncope and collapse  No arrhythmic events noted on telemetry.  2-D echo showed EF 55-60 percent with no regional wall motion abnormalities and no abnormal diastolic function parameters however a thin mobile stranded density in the RA was noted and recommendations were to pursue a TEE or cardiac MRI for better delineation.  Echo reviewed by Dr. Elease Hashimoto who feels that this does not need to be evaluated further.  Troponins negative.  Follow up carotid Dopplers.  PT evaluation requested.  Active Problems:   Lung nodule  Will need repeat CT scan in 6-12 months.    Type 2 diabetes mellitus  Takes glipizide and Glucophage at home, but has not gotten these medications in a year due to financial constraints.  Currently on insulin sensitive SSI 3 times a day.  CBGs 132-266. Continue SSI, moderate scale.  Resume Glipizide and Glucophage.  Check hemoglobin A1c.    Osteoporosis/Compression fracture of T12 vertebra  Interventional radiology has evaluated the patient, but patient declines vertebroplasty.  Continue pain relief efforts.    DVT Prophylaxis  Continue Lovenox.  Code Status: Full. Family Communication: Sons at the bedside 05/18/14. Disposition Plan: Home when stable.   IV Access:    Peripheral IV   Procedures and diagnostic studies:   Ct Head Wo Contrast 05/17/2014: No acute intracranial abnormality.  Mild cerebral atrophy.     Dg Chest Port 1 View 05/17/2014: No infiltrate or congestive heart  failure.  Calcified slightly tortuous aorta.    Ct Angio Chest Aorta W/cm &/or Wo/cm 05/17/2014: Mild wedge compression deformity of T12 vertebral body is noted consistent with acute compression fracture. No pneumothorax or pleural effusion is noted. 6 mm nodule is noted laterally in the right lower lobe. Given risk factors for bronchogenic carcinoma, follow-up chest CT at 6 - 12 months is recommended.  No other significant pulmonary parenchymal abnormality is noted. There is no evidence of thoracic aortic dissection or aneurysm. Great vessels are widely patent without significant stenosis. No mediastinal mass or adenopathy is noted. There is no evidence of pulmonary embolus.  Review of the MIP images confirms the above findings.    Ct Cta Abd/pel W/cm &/or W/o Cm 05/17/2014: There is no evidence of abdominal aortic dissection or aneurysm. Iliac arteries are widely patent. The mesenteric and renal arteries are widely patent without significant stenosis. No abnormality is noted in the liver, spleen or pancreas. Adrenal glands and kidneys appear normal. Symmetric enhancement of both kidneys is noted. No hydronephrosis or renal obstruction is noted. No gallstones are noted. There is no evidence of bowel obstruction. Urinary bladder appears normal. Uterus and ovaries appear normal.  Review of the MIP images confirms the above findings.   2-D echocardiogram 05/18/14: - Left ventricle: LV strain is normal at -21% The cavity size was normal. Wall thickness was normal. Systolic function was normal. The estimated ejection fraction was in the range of 55% to 60%. Wall motion was normal; there were no regional wall motion abnormalities. Left ventricular diastolic function parameters were normal. -  Aortic valve: Moderate thickening and calcification, consistent with sclerosis. There was trivial regurgitation. - Mitral valve: There was trivial regurgitation. - Right atrium: There is a thin mobile  stranded density in the RA that may represent a Chiari network but in the subcostal view appears as a mobile septation. Recommend TEE or Cardiac MRI for better delineation. - Atrial septum: No defect or patent foramen ovale was identified.  Medical Consultants:    Dr. Delane GingerPhil Nahser, Cardiology, telephone consult only.  Interventional Radiology  Anti-Infectives:    None.  Subjective:   Ana Hines reports ongoing significant back pain, especially with movement. Still with nausea, has not moved her bowels in a few days.  Objective:    Filed Vitals:   05/18/14 0913 05/18/14 1304 05/18/14 2153 05/19/14 0619  BP: 151/82 139/54 144/64 151/76  Pulse: 88 64 91 66  Temp:  98.4 F (36.9 C) 99.1 F (37.3 C) 97.7 F (36.5 C)  TempSrc:  Oral Oral Axillary  Resp:  16 18 16   Height:      Weight:      SpO2:  100% 100% 99%    Intake/Output Summary (Last 24 hours) at 05/19/14 0842 Last data filed at 05/18/14 1331  Gross per 24 hour  Intake    215 ml  Output      0 ml  Net    215 ml    Exam: Gen:  NAD Cardiovascular:  RRR, No M/R/G Respiratory:  Lungs CTAB Gastrointestinal:  Abdomen soft, NT/ND, + BS Extremities:  No C/E/C   Data Reviewed:    Labs: Basic Metabolic Panel:  Recent Labs Lab 05/17/14 1537 05/17/14 1557 05/17/14 1602 05/18/14 0208  NA 139 137 140 138  K 4.1 4.2 4.0 3.9  CL 102 102 103 106  CO2  --  26  --  24  GLUCOSE 215* 213* 216* 225*  BUN 9 9 8 10   CREATININE 0.70 0.69 0.60 0.61  CALCIUM  --  9.6  --  8.5   GFR Estimated Creatinine Clearance: 52.8 mL/min (by C-G formula based on Cr of 0.61). Liver Function Tests:  Recent Labs Lab 05/17/14 1557  AST 26  ALT 17  ALKPHOS 89  BILITOT 0.6  PROT 8.0  ALBUMIN 4.4    Recent Labs Lab 05/17/14 1557  LIPASE 26   Coagulation profile  Recent Labs Lab 05/17/14 1557  INR 0.93    CBC:  Recent Labs Lab 05/17/14 1528 05/17/14 1537 05/17/14 1602 05/18/14 0208  WBC  12.2*  --   --  11.2*  HGB 13.5 15.6* 15.6* 11.9*  HCT 41.2 46.0 46.0 37.1  MCV 98.8  --   --  98.9  PLT 316  --   --  264   Cardiac Enzymes:  Recent Labs Lab 05/17/14 2009 05/18/14 0208 05/18/14 0745  TROPONINI <0.03 <0.03 <0.03   CBG:  Recent Labs Lab 05/18/14 0833 05/18/14 1132 05/18/14 1706 05/18/14 2152 05/19/14 0723  GLUCAP 154* 132* 234* 184* 266*   Hgb A1c:  Recent Labs  05/17/14 1710  HGBA1C 8.9*   Microbiology No results found for this or any previous visit (from the past 240 hour(s)).   Medications:   . enoxaparin (LOVENOX) injection  40 mg Subcutaneous Q24H  . feeding supplement (GLUCERNA SHAKE)  237 mL Oral BID BM  . insulin aspart  0-15 Units Subcutaneous TID WC  . insulin aspart  0-5 Units Subcutaneous QHS   Continuous Infusions:   Time spent: 25 minutes.   LOS:  2 days   Tierney Behl  [facility] Pager [phone]. If unable to reach me by pager, please call my cell phone at [phone].  *Please refer to [email or web address], password [email or web address] to get updated schedule on who will round on this patient, as hospitalists switch teams weekly. If 7PM-7AM, please contact night-coverage at [email or web address], password [email or web address] for any overnight needs.  05/19/2014, 8:42 AM

## 2014-05-19 NOTE — Discharge Summary (Signed)
Physician Discharge Summary  [name]:[id number] DOB: [birth year] DOA: 05/17/2014  PCP: No primary care provider on file. Referred to [facility].  Admit date: 05/17/2014 Discharge date: 05/19/2014   Recommendations for Outpatient Follow-Up:   1. Recommend close outpatient F/U of DM control.  Hemoglobin A1c 8.9%. 2. Recommend referral to an ENT to further evaluate dizziness, vertigo (has left ear drum scarring, recently had a foreign body in ear removed). 3. Repeat CT scan needed in 6-12 months to evaluate lung nodule. 4. Home health PT, RN set up.   Discharge Diagnosis:   Principal Problem:    Syncope and collapse Active Problems:    Type 2 diabetes mellitus    Osteoporosis    Compression fracture of T12 vertebra    Lung nodule   Discharge Condition: Improved.  Diet recommendation: Carbohydrate-modified.     History of Present Illness:   Ana Hines is an 64 y.o. female was admitted after an unwitnessed fall on 05/17/14 with possible syncope. Initial workup included a CT of the head which was was negative for acute findings. Patient had significant back pain and radiographs show an acute T12 compression fracture.  Hospital Course by Problem:   Principal Problem:  Syncope and collapse  No arrhythmic events noted on telemetry.  2-D echo showed EF 55-60 percent with no regional wall motion abnormalities and no abnormal diastolic function parameters however a thin mobile stranded density in the RA was noted and recommendations were to pursue a TEE or cardiac MRI for better delineation. Echo reviewed by Dr. Elease Hashimoto who feels that this does not need to be evaluated further.  Troponins negative.  Carotid Dopplers showed 1-39% ICA stenosis bilaterally.  Not orthostatic.  Home PT set up, recommend outpatient referral to ENT for possible vertigo.  Antivert prescribed PRN.  Active Problems:  Lung nodule  Will need repeat CT scan in 6-12 months.   Type 2  diabetes mellitus  Takes glipizide and Glucophage at home, but has not gotten these medications in a year due to financial constraints.  Currently on insulin sensitive SSI 3 times a day.  CBGs 132-266. Continue SSI, moderate scale. Resume Glipizide and Glucophage.  Check hemoglobin A1c.   Osteoporosis/Compression fracture of T12 vertebra  Interventional radiology has evaluated the patient, but patient declined vertebroplasty.  Continue pain relief efforts.    Medical Consultants:    Dr. Delane Ginger, Cardiology, telephone consult only.  Interventional Radiology   Discharge Exam:   Filed Vitals:   05/19/14 1315  BP: 133/61  Pulse: 71  Temp: 97.7 F (36.5 C)  Resp: 16   Filed Vitals:   05/18/14 1304 05/18/14 2153 05/19/14 0619 05/19/14 1315  BP: 139/54 144/64 151/76 133/61  Pulse: 64 91 66 71  Temp: 98.4 F (36.9 C) 99.1 F (37.3 C) 97.7 F (36.5 C) 97.7 F (36.5 C)  TempSrc: Oral Oral Axillary Oral  Resp: Height:      Weight:      SpO2: 100% 100% 99% 98%    Gen:  NAD Cardiovascular:  RRR, No M/R/G Respiratory: Lungs CTAB Gastrointestinal: Abdomen soft, NT/ND with normal active bowel sounds. Extremities: No C/E/C   The results of significant diagnostics from this hospitalization (including imaging, microbiology, ancillary and laboratory) are listed below for reference.     Procedures and Diagnostic Studies:   Ct Head Wo Contrast 05/17/2014: No acute intracranial abnormality. Mild cerebral atrophy.   Dg Chest Port 1 View 05/17/2014: No infiltrate or congestive  heart failure. Calcified slightly tortuous aorta.   Ct Angio Chest Aorta W/cm &/or Wo/cm 05/17/2014: Mild wedge compression deformity of T12 vertebral body is noted consistent with acute compression fracture. No pneumothorax or pleural effusion is noted. 6 mm nodule is noted laterally in the right lower lobe. Given risk factors for bronchogenic carcinoma, follow-up chest CT at 6 -  12 months is recommended. No other significant pulmonary parenchymal abnormality is noted. There is no evidence of thoracic aortic dissection or aneurysm. Great vessels are widely patent without significant stenosis. No mediastinal mass or adenopathy is noted. There is no evidence of pulmonary embolus. Review of the MIP images confirms the above findings.   Ct Cta Abd/pel W/cm &/or W/o Cm 05/17/2014: There is no evidence of abdominal aortic dissection or aneurysm. Iliac arteries are widely patent. The mesenteric and renal arteries are widely patent without significant stenosis. No abnormality is noted in the liver, spleen or pancreas. Adrenal glands and kidneys appear normal. Symmetric enhancement of both kidneys is noted. No hydronephrosis or renal obstruction is noted. No gallstones are noted. There is no evidence of bowel obstruction. Urinary bladder appears normal. Uterus and ovaries appear normal. Review of the MIP images confirms the above findings.   2-D echocardiogram 05/18/14: - Left ventricle: LV strain is normal at -21% The cavity size was normal. Wall thickness was normal. Systolic function was normal. The estimated ejection fraction was in the range of 55% to 60%. Wall motion was normal; there were no regional wall motion abnormalities. Left ventricular diastolic function parameters were normal. - Aortic valve: Moderate thickening and calcification, consistent with sclerosis. There was trivial regurgitation. - Mitral valve: There was trivial regurgitation. - Right atrium: There is a thin mobile stranded density in the RA that may represent a Chiari network but in the subcostal view appears as a mobile septation. Recommend TEE or Cardiac MRI for better delineation. - Atrial septum: No defect or patent foramen ovale was identified.   Labs:   Basic Metabolic Panel:  Recent Labs Lab 05/17/14 1537 05/17/14 1557 05/17/14 1602 05/18/14 0208  NA 139 137 140 138   K 4.1 4.2 4.0 3.9  CL 102 102 103 106  CO2  --  26  --  24  GLUCOSE 215* 213* 216* 225*  BUN CREATININE 0.70 0.69 0.60 0.61  CALCIUM  --  9.6  --  8.5   GFR Estimated Creatinine Clearance: 52.8 mL/min (by C-G formula based on Cr of 0.61). Liver Function Tests:  Recent Labs Lab 05/17/14 1557  AST 26  ALT 17  ALKPHOS 89  BILITOT 0.6  PROT 8.0  ALBUMIN 4.4    Recent Labs Lab 05/17/14 1557  LIPASE 26   Coagulation profile  Recent Labs Lab 05/17/14 1557  INR 0.93    CBC:  Recent Labs Lab 05/17/14 1528 05/17/14 1537 05/17/14 1602 05/18/14 0208  WBC 12.2*  --   --  11.2*  HGB 13.5 15.6* 15.6* 11.9*  HCT 41.2 46.0 46.0 37.1  MCV 98.8  --   --  98.9  PLT 316  --   --  264   Cardiac Enzymes:  Recent Labs Lab 05/17/14 2009 05/18/14 0208 05/18/14 0745  TROPONINI <0.03 <0.03 <0.03    CBG:  Recent Labs Lab 05/18/14 1132 05/18/14 1706 05/18/14 2152 05/19/14 0723 05/19/14 1316  GLUCAP 132* 234* 184* 266* 224*   Hgb A1c  Recent Labs  05/17/14 1710  HGBA1C 8.9*     Discharge  Instructions:   Discharge Instructions    Face-to-face encounter (required for Medicare/Medicaid patients)    Complete by:  As directed   I Roque Schill certify that this patient is under my care and that I, or a nurse practitioner or physician's assistant working with me, had a face-to-face encounter that meets the physician face-to-face encounter requirements with this patient on 05/19/2014. The encounter with the patient was in whole, or in part for the following medical condition(s) which is the primary reason for home health care (List medical condition): compression fracture / pain with mobility.  RN to teach and monitor DM, pain control.  The encounter with the patient was in whole, or in part, for the following medical condition, which is the primary reason for home health care:  Compression fraction  I certify that, based on my findings, the following  services are medically necessary home health services:   Nursing Physical therapy    Reason for Medically Necessary Home Health Services:   Skilled Nursing- Changes in Medication/Medication Management Skilled Nursing- Skilled Assessment/Observation Skilled Nursing- Teaching of Disease Process/Symptom Management Therapy- Instruction on use of Assistive Device for Ambulation on all Surfaces Therapy- Home Adaptation to Facilitate Safety Therapy- Therapeutic Exercises to Increase Strength and Endurance    My clinical findings support the need for the above services:  Pain interferes with ambulation/mobility  Further, I certify that my clinical findings support that this patient is homebound due to:  Pain interferes with ambulation/mobility     For home use only DME 4 wheeled rolling walker with seat    Complete by:  As directed      Home Health    Complete by:  As directed   To provide the following care/treatments:   PT RN Social work              Medication List    TAKE these medications        feeding supplement (GLUCERNA SHAKE) Liqd  Take 237 mLs by mouth 2 (two) times daily between meals.     glipiZIDE 10 MG tablet  Commonly known as:  GLUCOTROL  Take 1 tablet (10 mg total) by mouth 2 (two) times daily.     meclizine 25 MG tablet  Commonly known as:  ANTIVERT  Take 1 tablet (25 mg total) by mouth 3 (three) times daily as needed for dizziness.     metFORMIN 500 MG tablet  Commonly known as:  GLUCOPHAGE  Take 1 tablet (500 mg total) by mouth 2 (two) times daily with a meal.     ondansetron 4 MG disintegrating tablet  Commonly known as:  ZOFRAN ODT  Take 1 tablet (4 mg total) by mouth every 8 (eight) hours as needed for nausea or vomiting.     Oxycodone HCl 10 MG Tabs  Take 1-1.5 tablets (10-15 mg total) by mouth every 3 (three) hours as needed for moderate pain or severe pain.     polyethylene glycol packet  Commonly known as:  MIRALAX / GLYCOLAX  Take 17 g by  mouth daily.           Follow-up Information    [facility]     On 05/27/2014.   Why:  appointment at 4:45 PM, take discharge papers, all medications, insurance card, Photo ID with you.    Contact information:   [address] [place] [id number] [phone]       Time coordinating discharge: 30 minutes.  Signed:  Dong Nimmons  Pager [phone] [facility] 05/19/2014, 4:06 PM

## 2014-05-21 LAB — HEMOGLOBIN A1C
HEMOGLOBIN A1C: 9.7 % — AB (ref 4.8–5.6)
Mean Plasma Glucose: 232 mg/dL

## 2014-05-23 ENCOUNTER — Telehealth: Payer: Self-pay | Admitting: *Deleted

## 2014-05-23 NOTE — Telephone Encounter (Signed)
Nurse form [facility] needing authorization for occupation Therapy for evaluation and Tx  Nurse Arin # [phone]

## 2014-05-27 ENCOUNTER — Ambulatory Visit: Payer: BLUE CROSS/BLUE SHIELD | Attending: Family Medicine | Admitting: Family Medicine

## 2014-05-27 ENCOUNTER — Encounter: Payer: Self-pay | Admitting: Family Medicine

## 2014-05-27 VITALS — BP 148/83 | HR 95 | Temp 99.0°F | Resp 16 | Ht 69.0 in | Wt 106.0 lb

## 2014-05-27 DIAGNOSIS — E119 Type 2 diabetes mellitus without complications: Secondary | ICD-10-CM | POA: Insufficient documentation

## 2014-05-27 DIAGNOSIS — E1165 Type 2 diabetes mellitus with hyperglycemia: Secondary | ICD-10-CM

## 2014-05-27 DIAGNOSIS — B351 Tinea unguium: Secondary | ICD-10-CM | POA: Diagnosis not present

## 2014-05-27 DIAGNOSIS — S22089D Unspecified fracture of T11-T12 vertebra, subsequent encounter for fracture with routine healing: Secondary | ICD-10-CM | POA: Insufficient documentation

## 2014-05-27 DIAGNOSIS — M4854XD Collapsed vertebra, not elsewhere classified, thoracic region, subsequent encounter for fracture with routine healing: Secondary | ICD-10-CM

## 2014-05-27 DIAGNOSIS — F17200 Nicotine dependence, unspecified, uncomplicated: Secondary | ICD-10-CM | POA: Diagnosis not present

## 2014-05-27 DIAGNOSIS — L538 Other specified erythematous conditions: Secondary | ICD-10-CM | POA: Diagnosis not present

## 2014-05-27 DIAGNOSIS — W19XXXD Unspecified fall, subsequent encounter: Secondary | ICD-10-CM | POA: Insufficient documentation

## 2014-05-27 DIAGNOSIS — M810 Age-related osteoporosis without current pathological fracture: Secondary | ICD-10-CM

## 2014-05-27 DIAGNOSIS — R52 Pain, unspecified: Secondary | ICD-10-CM | POA: Diagnosis not present

## 2014-05-27 DIAGNOSIS — S22080D Wedge compression fracture of T11-T12 vertebra, subsequent encounter for fracture with routine healing: Secondary | ICD-10-CM

## 2014-05-27 DIAGNOSIS — Z0000 Encounter for general adult medical examination without abnormal findings: Secondary | ICD-10-CM

## 2014-05-27 LAB — POCT GLYCOSYLATED HEMOGLOBIN (HGB A1C): HEMOGLOBIN A1C: 8.6

## 2014-05-27 LAB — GLUCOSE, POCT (MANUAL RESULT ENTRY): POC Glucose: 234 mg/dl — AB (ref 70–99)

## 2014-05-27 MED ORDER — TRAMADOL HCL 50 MG PO TABS: 50.0000 mg | ORAL_TABLET | Freq: Three times a day (TID) | ORAL | Status: DC | PRN

## 2014-05-27 MED ORDER — ACETAMINOPHEN-CODEINE #3 300-30 MG PO TABS: 1.0000 | ORAL_TABLET | Freq: Three times a day (TID) | ORAL | Status: DC | PRN

## 2014-05-27 MED ORDER — "ADJUSTABLE ALUMINUM CANE 7/8"" MISC": 1.0000 | Status: DC | PRN

## 2014-05-27 MED ORDER — TERBINAFINE HCL 250 MG PO TABS: 250.0000 mg | ORAL_TABLET | Freq: Every day | ORAL | Status: DC

## 2014-05-27 NOTE — Assessment & Plan Note (Addendum)
Due for screening mammogram and pap Patient referred for mammogram and will f/u for pap

## 2014-05-27 NOTE — Assessment & Plan Note (Signed)
Repeat DEXA scan ordered

## 2014-05-27 NOTE — Assessment & Plan Note (Addendum)
   2. Diabetes: Continue glipizide with meals Low carb diet  Foot exam, cholesterol and urine microalbumin today   Initiating statin for elevated CVD risk, lipitor 40 mg daily

## 2014-05-27 NOTE — Progress Notes (Signed)
   Subjective:    Patient ID: Ana Hines, female    DOB: 11/03/1950, 64 y.o.   MRN: [id number] CC: establish care HFU fall with T12 fracture, DM type 2 HPI 64 yo F presents with sister:  1. T12 fracture:doing well. Taking oxycodone q 3, 3 tabs left. Pain with prolonged sitting. Has started PT. No recurrent fall. Suspects fall was related to low CBG. Denies CP, SOB, dizziness or feeling faint with position changes. Using walker. Request cane. Reports that PT stated a cane would work well.   2. DM type 2: lost 70# while taking metformin. Take glipizide only, none today yet. Denies GI upset, tingling or burning or numbness in extremities, polyuria and polydipsia.   Soc Hx: current smoker  Med Hx: osteoperosis dx around 2000 and treated for 5 years with what sounds like bisphosphonate Surg Hx: s/p tubal ligation  Review of Systems As per HPI     Objective:   Physical Exam BP 148/83 mmHg  Pulse 95  Temp(Src) 99 F (37.2 C)  Resp 16  Ht 5\' 9"  (1.753 m)  Wt 106 lb (48.081 kg)  BMI 15.65 kg/m2  SpO2 100% General appearance: alert, cooperative and no distress Eyes: conjunctivae/corneas clear. PERRL, EOM's intact. Fundi benign. Ears: normal TM's and external ear canals both ears Back: symmetric, no curvature. ROM normal. No CVA tenderness., mild erythema and tenderness along T12  Lungs: clear to auscultation bilaterally Heart: regular rate and rhythm, S1, S2 normal, no murmur, click, rub or gallop Extremities: extremities normal, atraumatic, no cyanosis or edema  Diabetic foot exam done Thickened and yellow toenails noted   Lab Results  Component Value Date   HGBA1C 8.60 05/27/2014  CBG 234     Assessment & Plan:

## 2014-05-27 NOTE — Assessment & Plan Note (Signed)
1. T12 compression fracture:; Continue therapy  Order for cane Step down from oxycodone which is a strong narcotic pain medicine to tramadol and tylenol #3 for next month you may take both up to three times daily, I recommend taking them at separate times like 4 hrs apart  Bone density scan

## 2014-05-27 NOTE — Patient Instructions (Signed)
Mrs. Annabell HowellsWrenn,  Thank you for coming in today. It was a pleasure meeting you. I look forward to being your primary doctor.  1. T12 compression fracture:; Continue therapy  Order for cane Step down from oxycodone which is a strong narcotic pain medicine to tramadol and tylenol #3 for next month you may take both up to three times daily, I recommend taking them at separate times like 4 hrs apart  Bone density scan  2. Diabetes: Continue glipizide with meals Low carb diet  Foot exam, cholesterol and urine microalbumin today   3. Screening: Mammogram, please schedule at your convenience  Pap smear we will do this at your follow up visit   F/u in 4-6 weeks for follow up T12 compression fracture and pap smear  Dr. Armen PickupFunches

## 2014-05-27 NOTE — Assessment & Plan Note (Signed)
A: noted on exam. Patient desires treatment. Normal LFTs P: lamisil 250 mg daily for 12 weeks

## 2014-05-27 NOTE — Progress Notes (Signed)
Patient fell at home T12 vertebrae fracture on 1/26 Patient still experiencing pain 7/10 Patient taking oxycodone for pain Patient strongly refuses flu/PNA vaccine

## 2014-05-28 LAB — LIPID PANEL
CHOL/HDL RATIO: 3.8 ratio
CHOLESTEROL: 164 mg/dL (ref 0–200)
HDL: 43 mg/dL (ref >39)
LDL CALC: 97 mg/dL (ref 0–99)
TRIGLYCERIDES: 120 mg/dL (ref <150)
VLDL: 24 mg/dL (ref 0–40)

## 2014-05-28 LAB — MICROALBUMIN / CREATININE URINE RATIO
CREATININE, URINE: 31.3 mg/dL
MICROALB UR: 0.5 mg/dL (ref <2.0)
Microalb Creat Ratio: 16 mg/g (ref 0.0–30.0)

## 2014-05-30 ENCOUNTER — Telehealth: Payer: Self-pay | Admitting: *Deleted

## 2014-05-30 NOTE — Telephone Encounter (Signed)
Left voice message, [facility]

## 2014-05-30 NOTE — Telephone Encounter (Signed)
-----   Message from Lora PaulaJosalyn C Funches, MD sent at 05/27/2014  6:43 PM EST ----- Please call patient. lamisil for toenail fungus sent to pharmacy

## 2014-05-31 ENCOUNTER — Telehealth: Payer: Self-pay | Admitting: *Deleted

## 2014-05-31 MED ORDER — ATORVASTATIN CALCIUM 40 MG PO TABS: 40.0000 mg | ORAL_TABLET | Freq: Every day | ORAL | Status: DC

## 2014-05-31 NOTE — Telephone Encounter (Signed)
-----   Message from JLora Paulaosalyn C Funches, MD sent at 05/31/2014  9:17 AM EST ----- 64 yo F with diabetes While cholesterol panel and urine microalbumin are normal in order to help prevent stroke and heart attack, I recommend starting statin therapy, lipitor 40 mg daily sent to pharmacy.

## 2014-05-31 NOTE — Telephone Encounter (Signed)
Pt aware of lab results, [facility]

## 2014-05-31 NOTE — Addendum Note (Signed)
Addended by: Dessa PhiFUNCHES, Sindi Beckworth on: 05/31/2014 09:21 AM   Modules accepted: Orders

## 2014-06-15 ENCOUNTER — Other Ambulatory Visit: Payer: Self-pay

## 2014-06-15 DIAGNOSIS — Z1231 Encounter for screening mammogram for malignant neoplasm of breast: Secondary | ICD-10-CM

## 2014-06-17 ENCOUNTER — Ambulatory Visit
Admission: RE | Admit: 2014-06-17 | Discharge: 2014-06-17 | Disposition: A | Discharge status: Home / Self Care | Payer: BC Managed Care – PPO | Source: Ambulatory Visit

## 2014-06-17 ENCOUNTER — Ambulatory Visit
Admission: RE | Admit: 2014-06-17 | Discharge: 2014-06-17 | Disposition: A | Discharge status: Home / Self Care | Payer: BC Managed Care – PPO | Source: Ambulatory Visit | Attending: Family Medicine | Admitting: Family Medicine

## 2014-06-17 DIAGNOSIS — Z1231 Encounter for screening mammogram for malignant neoplasm of breast: Secondary | ICD-10-CM

## 2014-06-17 DIAGNOSIS — M810 Age-related osteoporosis without current pathological fracture: Secondary | ICD-10-CM

## 2014-06-18 ENCOUNTER — Other Ambulatory Visit: Payer: Self-pay | Admitting: Family Medicine

## 2014-06-18 DIAGNOSIS — M810 Age-related osteoporosis without current pathological fracture: Secondary | ICD-10-CM

## 2014-06-18 NOTE — Assessment & Plan Note (Signed)
A: osteoporosis confirmed on repeat bone density scan P:  Check vit D level  Start calcium and vit D supplement Talk with patient about fosamax, risk and benefits

## 2014-06-20 ENCOUNTER — Telehealth: Payer: Self-pay | Admitting: *Deleted

## 2014-06-20 NOTE — Telephone Encounter (Signed)
-----   Message from Lora PaulaJosalyn C Funches, MD sent at 06/18/2014  9:27 AM EST ----- Persistent oseteoperosis on dexa scan Plan: Check Vit D level- needs blood draw Start calcium and vit D supplement to help prevent ongoing bone loss Would like to start bisphophanate as well (fosomax once weekly) patient should make f/u appt to discuss the benefits and risk of the medication.

## 2014-06-20 NOTE — Telephone Encounter (Signed)
-----   Message from Lora PaulaJosalyn C Funches, MD sent at 06/17/2014  4:09 PM EST ----- Normal mammogram Repeat in one year

## 2014-06-20 NOTE — Telephone Encounter (Signed)
Pt aware of mammogram results 

## 2014-06-20 NOTE — Telephone Encounter (Signed)
Left voice message to return call 

## 2014-06-24 ENCOUNTER — Encounter: Payer: Self-pay | Admitting: Family Medicine

## 2014-06-24 ENCOUNTER — Ambulatory Visit: Payer: BC Managed Care – PPO | Attending: Family Medicine | Admitting: Family Medicine

## 2014-06-24 VITALS — BP 127/75 | HR 79 | Temp 98.5°F | Resp 16 | Ht 69.0 in | Wt 105.4 lb

## 2014-06-24 DIAGNOSIS — E559 Vitamin D deficiency, unspecified: Secondary | ICD-10-CM | POA: Insufficient documentation

## 2014-06-24 DIAGNOSIS — R636 Underweight: Secondary | ICD-10-CM | POA: Insufficient documentation

## 2014-06-24 DIAGNOSIS — Z124 Encounter for screening for malignant neoplasm of cervix: Secondary | ICD-10-CM | POA: Diagnosis not present

## 2014-06-24 DIAGNOSIS — M810 Age-related osteoporosis without current pathological fracture: Secondary | ICD-10-CM | POA: Diagnosis not present

## 2014-06-24 MED ORDER — ALENDRONATE SODIUM 70 MG PO TABS: 70.0000 mg | ORAL_TABLET | ORAL | Status: DC

## 2014-06-24 NOTE — Assessment & Plan Note (Signed)
Pap done today  

## 2014-06-24 NOTE — Assessment & Plan Note (Addendum)
2. 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  Plan: Checking vitamin D level. You will be started on a vit D and calcium supplement and called with the dose. Fosamax 70 mg once weekly. Monitoring bone density with repeat scan in 2 years.

## 2014-06-24 NOTE — Progress Notes (Signed)
   Subjective:    Patient ID: Ana GuyMildred R Hines, female    DOB: 01/08/1951, 64 y.o.   MRN: [id number] CC: pap smear  HPI  1. Pap: no vaginal bleeding. No pelvic pain or masses. No discharge.  2. Osteoporosis: confirmed on dexa scan. Patient with T12 compression fracture. Healing. Intermittent sharp pains. Risk factors: DM2, low weight, smoker.   Soc Hx: current smoker  Review of Systems As per HPI     Objective:   Physical Exam BP 127/75 mmHg  Pulse 79  Temp(Src) 98.5 F (36.9 C) (Oral)  Resp 16  Ht 5\' 9"  (1.753 m)  Wt 105 lb 6.4 oz (47.809 kg)  BMI 15.56 kg/m2  SpO2 100% General appearance: alert, cooperative and no distress Abdomen: soft, non-tender; bowel sounds normal; no masses,  no organomegaly Pelvic: cervix normal in appearance, external genitalia normal, no adnexal masses or tenderness, no cervical motion tenderness, rectovaginal septum normal, uterus normal size, shape, and consistency and vagina normal without discharge  Pap done today      Assessment & Plan:

## 2014-06-24 NOTE — Progress Notes (Signed)
Pt here for annual pap smear Declined STD/ HIV screening Denies vag d/c or pain Requesting xray for back pain

## 2014-06-24 NOTE — Patient Instructions (Addendum)
[name],  Thank you for coming in today.  1. Pap done today.  2. 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  Plan: Checking vitamin D level. You will be started on a vit D and calcium supplement and called with the dose. Fosamax 70 mg once weekly. Monitoring bone density with repeat scan in 2 years.   F/u in 2 months for diabetes   Dr. Armen PickupFunches   Osteoporosis Osteoporosis happens when your bones become weak because of bone loss. Weak bones can break (fracture) more easily with slips or falls. You are more likely to develop osteoporosis if:  You are a woman.  You are older than 50 years.  You are white or Asian.  You are very thin.  Someone in your family has had osteoporosis.  You smoke or use nicotine. CAUSES   Smoking.  Too much drinking.  Being a weight below normal.  Not being active.  Not going outside in the sun enough.  Certain medical conditions, such as diabetes or Crohn disease.  Certain medicines, such as steroids or antiseizure medicines. TREATMENT  The goal of treatment is to strengthen bones. There are different types of medicines that help your bones. Some medicines make your bones more solid. Some medicines help to slow down how much bone you lose. Your doctor may check to see if you are getting enough calcium and vitamin D in your diet. PREVENTION   Make sure you get enough calcium and vitamin D.  Make sure you exercise often.  If you smoke, quit. MAKE SURE YOU:  Understand these instructions.  Will watch your condition.  Will get help right away if you are not doing well or get worse. Document Released: 07/01/2011 Document Reviewed: 07/01/2011 [facility] Patient Information 2015 [place], [place]. This information is not intended to replace advice given to you by your health care provider. Make sure you discuss any questions you have with your health care provider.

## 2014-06-25 LAB — VITAMIN D 25 HYDROXY (VIT D DEFICIENCY, FRACTURES): Vit D, 25-Hydroxy: 15 ng/mL — ABNORMAL LOW (ref 30–100)

## 2014-06-27 DIAGNOSIS — E559 Vitamin D deficiency, unspecified: Secondary | ICD-10-CM | POA: Insufficient documentation

## 2014-06-27 MED ORDER — VITAMIN D (ERGOCALCIFEROL) 1.25 MG (50000 UNIT) PO CAPS: 50000.0000 [IU] | ORAL_CAPSULE | ORAL | Status: DC

## 2014-06-27 MED ORDER — CALCIUM CITRATE-VITAMIN D 500-400 MG-UNIT PO CHEW: 1.0000 | CHEWABLE_TABLET | Freq: Two times a day (BID) | ORAL | Status: DC

## 2014-06-27 NOTE — Addendum Note (Signed)
Addended by: Dessa PhiFUNCHES, Saraiyah Hemminger on: 06/27/2014 08:11 AM   Modules accepted: Orders

## 2014-06-27 NOTE — Assessment & Plan Note (Signed)
Vit D 50,000 IU weekly x 8 weeks with 4 servings of calcium per day (cup yogurt, cheese slice or stick,  8 oz glass of milk) Followed by calcium + D supplement

## 2014-06-28 LAB — CERVICOVAGINAL ANCILLARY ONLY
Chlamydia: NEGATIVE
Neisseria Gonorrhea: NEGATIVE
Wet Prep (BD Affirm): NEGATIVE
Wet Prep (BD Affirm): NEGATIVE
Wet Prep (BD Affirm): POSITIVE — AB

## 2014-06-28 LAB — CYTOLOGY - PAP

## 2014-06-30 ENCOUNTER — Telehealth: Payer: Self-pay | Admitting: *Deleted

## 2014-06-30 NOTE — Telephone Encounter (Signed)
-----   Message from Doris Cheadleeepak Advani, MD sent at 06/30/2014  1:07 PM EST ----- Call and let the patient know that her PAP smear results are normal

## 2014-06-30 NOTE — Telephone Encounter (Signed)
Pt aware of pap resullts

## 2014-07-07 ENCOUNTER — Telehealth: Payer: Self-pay | Admitting: *Deleted

## 2014-07-07 NOTE — Telephone Encounter (Signed)
-----   Message from Dessa PhiJosalyn Funches, MD sent at 06/28/2014  8:23 AM EST ----- Neg screening GC/chlam BV on screening wet prep. No need to treat as patient was without symptoms

## 2014-08-25 ENCOUNTER — Encounter: Payer: Self-pay | Admitting: Family Medicine

## 2014-08-25 ENCOUNTER — Ambulatory Visit: Payer: BC Managed Care – PPO | Attending: Family Medicine | Admitting: Family Medicine

## 2014-08-25 VITALS — BP 109/66 | HR 78 | Temp 97.8°F | Resp 16 | Ht 69.0 in | Wt 105.0 lb

## 2014-08-25 DIAGNOSIS — E559 Vitamin D deficiency, unspecified: Secondary | ICD-10-CM | POA: Diagnosis not present

## 2014-08-25 DIAGNOSIS — E119 Type 2 diabetes mellitus without complications: Secondary | ICD-10-CM | POA: Diagnosis present

## 2014-08-25 DIAGNOSIS — F17210 Nicotine dependence, cigarettes, uncomplicated: Secondary | ICD-10-CM | POA: Insufficient documentation

## 2014-08-25 DIAGNOSIS — M810 Age-related osteoporosis without current pathological fracture: Secondary | ICD-10-CM | POA: Diagnosis not present

## 2014-08-25 DIAGNOSIS — E1165 Type 2 diabetes mellitus with hyperglycemia: Secondary | ICD-10-CM

## 2014-08-25 DIAGNOSIS — Z0000 Encounter for general adult medical examination without abnormal findings: Secondary | ICD-10-CM | POA: Diagnosis not present

## 2014-08-25 LAB — GLUCOSE, POCT (MANUAL RESULT ENTRY): POC Glucose: 192 mg/dl — AB (ref 70–99)

## 2014-08-25 LAB — POCT GLYCOSYLATED HEMOGLOBIN (HGB A1C): Hemoglobin A1C: 8.2

## 2014-08-25 MED ORDER — ZOSTER VACCINE LIVE 19400 UNT/0.65ML ~~LOC~~ SOLR: 0.6500 mL | Freq: Once | SUBCUTANEOUS | Status: DC

## 2014-08-25 MED ORDER — ACCU-CHEK SOFTCLIX LANCETS MISC: 1.0000 | Freq: Three times a day (TID) | Status: DC

## 2014-08-25 MED ORDER — VITAMIN D3 50 MCG (2000 UT) PO TABS: 2000.0000 [IU] | ORAL_TABLET | Freq: Every day | ORAL | Status: DC

## 2014-08-25 MED ORDER — GLUCOSE BLOOD VI STRP: 1.0000 | ORAL_STRIP | Freq: Three times a day (TID) | Status: DC

## 2014-08-25 MED ORDER — ACCU-CHEK AVIVA PLUS W/DEVICE KIT: 1.0000 | PACK | Freq: Three times a day (TID) | Status: DC

## 2014-08-25 MED ORDER — ACCU-CHEK SOFTCLIX LANCET DEV MISC: 1.0000 | Freq: Three times a day (TID) | Status: DC

## 2014-08-25 NOTE — Progress Notes (Signed)
F/U DM Unable to check glucose on daily basis, pt do not have glucometer

## 2014-08-25 NOTE — Patient Instructions (Addendum)
[name],  Thank you for coming in today.  1. Diabetes: Doing well Check sugars 1-2 times a day  Diabetes blood sugar goals  Fasting (in AM before breakfast, 8 hrs of no eating or drinking (except water or unsweetened coffee or tea): 90-110 2 hrs after meals: < 160,   No low sugars: nothing < 70   2. osteoporosis with low vit D: Finish once weekly vit D then start once daily vit D 2000 IU daily Eat 4 servings of calcium daily (milk, cheese, yogurt, ice cream (low sugar or sugar free))  3. Healthcare maintenance: Due for colonoscopy, zostavax, Tdap, pneumovax   F/u in 3 months for diabetes   Dr. Armen PickupFunches

## 2014-08-25 NOTE — Progress Notes (Signed)
   Subjective:    Patient ID: Ana Hines, female    DOB: 11/16/1950, 64 y.o.   MRN: [id number] CC: DM f/u  HPI  1. CHRONIC DIABETES  Disease Monitoring  Blood Sugar Ranges: not checking   Polyuria: no   Visual problems: no   Medication Compliance: yes  Medication Side Effects  Hypoglycemia: no   2. osteoporosis with vit D def: one week of 50 K U vit D, taking fosomax weekly. No GI upset. Eats dairy 3-4 servings per day.    Soc Hx: current smoker  Review of Systems  Constitutional: Negative.   Gastrointestinal: Negative.   Endocrine: Negative.   Musculoskeletal: Positive for back pain.       Objective:   Physical Exam BP 109/66 mmHg  Pulse 78  Temp(Src) 97.8 F (36.6 C) (Oral)  Resp 16  Ht 5\' 9"  (1.753 m)  Wt 105 lb (47.628 kg)  BMI 15.50 kg/m2  SpO2 100% General appearance: alert, cooperative and no distress Back: symmetric, no curvature. ROM normal. No CVA tenderness. Lungs: clear to auscultation bilaterally Heart: regular rate and rhythm, S1, S2 normal, no murmur, click, rub or gallop Extremities: extremities normal, atraumatic, no cyanosis or edema       Assessment & Plan:

## 2014-08-25 NOTE — Assessment & Plan Note (Signed)
Healthcare maintenance: Due for colonoscopy, zostavax, Tdap, pneumovax

## 2014-08-25 NOTE — Assessment & Plan Note (Signed)
osteoporosis with low vit D: Finish once weekly vit D then start once daily vit D 2000 IU daily Eat 4 servings of calcium daily (milk, cheese, yogurt, ice cream (low sugar or sugar free))

## 2014-08-25 NOTE — Assessment & Plan Note (Signed)
1. Diabetes: Doing well Check sugars 1-2 times a day  Diabetes blood sugar goals  Fasting (in AM before breakfast, 8 hrs of no eating or drinking (except water or unsweetened coffee or tea): 90-110 2 hrs after meals: < 160,   No low sugars: nothing < 70

## 2015-01-16 ENCOUNTER — Ambulatory Visit: Payer: BC Managed Care – PPO | Admitting: Family Medicine

## 2015-01-23 ENCOUNTER — Encounter: Payer: Self-pay | Admitting: Family Medicine

## 2015-01-23 ENCOUNTER — Other Ambulatory Visit: Payer: Self-pay | Admitting: Family Medicine

## 2015-01-23 ENCOUNTER — Ambulatory Visit: Payer: BC Managed Care – PPO | Attending: Family Medicine | Admitting: Family Medicine

## 2015-01-23 VITALS — BP 104/66 | HR 88 | Temp 97.8°F | Resp 18 | Ht 69.0 in | Wt 108.0 lb

## 2015-01-23 DIAGNOSIS — R21 Rash and other nonspecific skin eruption: Secondary | ICD-10-CM | POA: Insufficient documentation

## 2015-01-23 DIAGNOSIS — B351 Tinea unguium: Secondary | ICD-10-CM | POA: Diagnosis not present

## 2015-01-23 DIAGNOSIS — F17210 Nicotine dependence, cigarettes, uncomplicated: Secondary | ICD-10-CM | POA: Insufficient documentation

## 2015-01-23 DIAGNOSIS — Z7984 Long term (current) use of oral hypoglycemic drugs: Secondary | ICD-10-CM | POA: Diagnosis not present

## 2015-01-23 DIAGNOSIS — Z1159 Encounter for screening for other viral diseases: Secondary | ICD-10-CM

## 2015-01-23 DIAGNOSIS — E1169 Type 2 diabetes mellitus with other specified complication: Secondary | ICD-10-CM | POA: Diagnosis not present

## 2015-01-23 DIAGNOSIS — Z79899 Other long term (current) drug therapy: Secondary | ICD-10-CM | POA: Diagnosis not present

## 2015-01-23 DIAGNOSIS — L988 Other specified disorders of the skin and subcutaneous tissue: Secondary | ICD-10-CM

## 2015-01-23 DIAGNOSIS — E119 Type 2 diabetes mellitus without complications: Secondary | ICD-10-CM | POA: Diagnosis present

## 2015-01-23 DIAGNOSIS — E559 Vitamin D deficiency, unspecified: Secondary | ICD-10-CM | POA: Diagnosis not present

## 2015-01-23 DIAGNOSIS — M4854XD Collapsed vertebra, not elsewhere classified, thoracic region, subsequent encounter for fracture with routine healing: Secondary | ICD-10-CM

## 2015-01-23 DIAGNOSIS — M549 Dorsalgia, unspecified: Secondary | ICD-10-CM | POA: Insufficient documentation

## 2015-01-23 DIAGNOSIS — Z7983 Long term (current) use of bisphosphonates: Secondary | ICD-10-CM | POA: Diagnosis not present

## 2015-01-23 DIAGNOSIS — S22080D Wedge compression fracture of T11-T12 vertebra, subsequent encounter for fracture with routine healing: Secondary | ICD-10-CM

## 2015-01-23 LAB — HEPATITIS C ANTIBODY: HCV Ab: NEGATIVE

## 2015-01-23 LAB — GLUCOSE, POCT (MANUAL RESULT ENTRY): POC Glucose: 147 mg/dl — AB (ref 70–99)

## 2015-01-23 LAB — POCT GLYCOSYLATED HEMOGLOBIN (HGB A1C): Hemoglobin A1C: 7.5

## 2015-01-23 MED ORDER — TERBINAFINE HCL 250 MG PO TABS: 250.0000 mg | ORAL_TABLET | Freq: Every day | ORAL | Status: AC

## 2015-01-23 MED ORDER — AMMONIUM LACTATE 12 % EX CREA: TOPICAL_CREAM | CUTANEOUS | Status: DC | PRN

## 2015-01-23 MED ORDER — TRAMADOL HCL 50 MG PO TABS: 50.0000 mg | ORAL_TABLET | Freq: Three times a day (TID) | ORAL | Status: DC | PRN

## 2015-01-23 NOTE — Progress Notes (Signed)
Patient ID: Ana Hines, female   DOB: Jul 09, 1950, 64 y.o.   MRN: [id number]   Subjective:  Patient ID: Ana Hines, female    DOB: 03/01/1951  Age: 64 y.o. MRN: [id number]  CC: No chief complaint on file.   HPI [name] presents for  1. CHRONIC DIABETES  Disease Monitoring  Blood Sugar Ranges:   Polyuria: no   Visual problems: no   Medication Compliance: yes  Medication Side Effects  Hypoglycemia: no   2. Toenail fungus: has one day left of lamisil. No pain in toes. Nails are long.  Would like to see a podiatrist.   3. Skin rash: for 4+ weeks. Non pruritic, non painful. Gets plaques that she peels off and underlying skin is pale. Skin is dry. No bleeding.   4. Back pain: pain with prolonged standing. Taking fosamax. Requesting tramadol refill. ? If compression fracture has healed. No recent falls. Pain does not radiate down to gluteal area or to legs. She took vitamin D supplement. She is not supplementing currently.   Social History  Substance Use Topics  . Smoking status: Current Every Day Smoker -- 0.50 packs/day    Types: Cigarettes  . Smokeless tobacco: Never Used  . Alcohol Use: 0.0 oz/week    0 Standard drinks or equivalent per week     Comment: 2-3 times a week   Outpatient Prescriptions Prior to Visit  Medication Sig Dispense Refill  . ACCU-CHEK SOFTCLIX LANCETS lancets 1 each by Other route 3 (three) times daily. E11.9 100 each 12  . alendronate (FOSAMAX) 70 MG tablet Take 1 tablet (70 mg total) by mouth once a week. Take with a full glass of water on an empty stomach. 12 tablet 3  . atorvastatin (LIPITOR) 40 MG tablet Take 1 tablet (40 mg total) by mouth daily. 90 tablet 3  . Blood Glucose Monitoring Suppl (ACCU-CHEK AVIVA PLUS) W/DEVICE KIT 1 Device by Does not apply route 3 (three) times daily after meals. E11.9 1 kit 0  . glipiZIDE (GLUCOTROL) 10 MG tablet Take 1 tablet (10 mg total) by mouth 2 (two) times daily. 60 tablet 3  . glucose blood  (ACCU-CHEK AVIVA PLUS) test strip 1 each by Other route 3 (three) times daily. E11.9 100 each 12  . Lancet Devices (ACCU-CHEK SOFTCLIX) lancets 1 each by Other route 3 (three) times daily. E11.9 1 each 0  . metFORMIN (GLUCOPHAGE) 500 MG tablet Take by mouth 2 (two) times daily with a meal.    . Misc. Devices (ADJUSTABLE ALUMINUM CANE 7/8") MISC 1 each by Does not apply route as needed. Diagnosis code M48.54XA 1 each 0  . acetaminophen-codeine (TYLENOL #3) 300-30 MG per tablet Take 1 tablet by mouth every 8 (eight) hours as needed for moderate pain. (Patient not taking: Reported on 01/23/2015) 90 tablet 0  . Cholecalciferol (VITAMIN D3) 2000 UNITS TABS Take 2,000 Units by mouth daily. (Patient not taking: Reported on 01/23/2015) 30 tablet 11  . feeding supplement, GLUCERNA SHAKE, (GLUCERNA SHAKE) LIQD Take 237 mLs by mouth 2 (two) times daily between meals. (Patient not taking: Reported on 01/23/2015) 12 Can 11  . meclizine (ANTIVERT) 25 MG tablet Take 25 mg by mouth 3 (three) times daily as needed for dizziness.    . ondansetron (ZOFRAN) 4 MG tablet Take 4 mg by mouth every 8 (eight) hours as needed for nausea or vomiting.    . terbinafine (LAMISIL) 250 MG tablet Take 1 tablet (250 mg total) by mouth daily. (  Patient not taking: Reported on 06/24/2014) 42 tablet 1  . traMADol (ULTRAM) 50 MG tablet Take 1 tablet (50 mg total) by mouth every 8 (eight) hours as needed for moderate pain. (Patient not taking: Reported on 01/23/2015) 90 tablet 0  . zoster vaccine live, PF, (ZOSTAVAX) 02774 UNT/0.65ML injection Inject 19,400 Units into the skin once. 1 each 0   No facility-administered medications prior to visit.    ROS Review of Systems  Constitutional: Negative for fever and chills.  Eyes: Negative for visual disturbance.  Respiratory: Negative for shortness of breath.   Cardiovascular: Negative for chest pain.  Gastrointestinal: Negative for abdominal pain and blood in stool.  Musculoskeletal: Positive  for back pain. Negative for arthralgias.  Skin: Positive for rash.  Allergic/Immunologic: Negative for immunocompromised state.  Hematological: Negative for adenopathy. Does not bruise/bleed easily.  Psychiatric/Behavioral: Negative for suicidal ideas and dysphoric mood.    Objective:  BP 104/66 mmHg  Pulse 88  Temp(Src) 97.8 F (36.6 C) (Oral)  Resp 18  Ht 5' 9" (1.753 m)  Wt 108 lb (48.988 kg)  BMI 15.94 kg/m2  SpO2 99%  BP/Weight 01/23/2015 04/23/8784 10/26/7207  Systolic BP 470 962 836  Diastolic BP 66 66 75  Wt. (Lbs) 108 105 105.4  BMI 15.94 15.5 15.56   Physical Exam  Constitutional: She is oriented to person, place, and time. She appears well-developed. No distress.  Thin AA female   HENT:  Head: Normocephalic and atraumatic.  Cardiovascular: Normal rate, regular rhythm, normal heart sounds and intact distal pulses.   Pulmonary/Chest: Effort normal and breath sounds normal.  Musculoskeletal: She exhibits no edema.       Thoracic back: She exhibits tenderness. She exhibits no swelling, no edema and no deformity.       Lumbar back: She exhibits bony tenderness. She exhibits no swelling, no edema, no deformity and no spasm.  Neurological: She is alert and oriented to person, place, and time.  Skin: Skin is warm and dry. Rash noted.  Xerotic with hyperkerototic plaques on legs   Psychiatric: She has a normal mood and affect.   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)   Relevant Medications   traMADol (ULTRAM) 50 MG tablet   Other Relevant Orders   DG THORACOLUMABAR SPINE   Onychomycosis of toenail (Chronic)   Relevant Medications   terbinafine (LAMISIL) 250 MG tablet   Other Relevant Orders   Ambulatory referral to Podiatry   Skin plaque   Relevant Medications   ammonium lactate (LAC-HYDRIN) 12 % cream   Type 2  diabetes mellitus (HCC) - Primary (Chronic)   Relevant Orders   POCT A1C (Completed)   Glucose (CBG) (Completed)   Ambulatory referral to Ophthalmology   Ambulatory referral to Podiatry   Vitamin D deficiency (Chronic)   Relevant Orders   Vitamin D, 25-hydroxy    Other Visit Diagnoses    Need for hepatitis C screening test        Relevant Orders    Hepatitis C antibody, reflex       No orders of the defined types were placed in this encounter.    Follow-up: No Follow-up on file.   Boykin Nearing MD

## 2015-01-23 NOTE — Patient Instructions (Addendum)
[name],  Diagnoses and all orders for this visit:  Type 2 diabetes mellitus with other specified complication (HCC) -     POCT A1C -     Glucose (CBG) -     Ambulatory referral to Ophthalmology -     Ambulatory referral to Podiatry  Onychomycosis of toenail -     Ambulatory referral to Podiatry -     terbinafine (LAMISIL) 250 MG tablet; Take 1 tablet (250 mg total) by mouth daily.  Compression fracture of T12 vertebra, with routine healing, subsequent encounter -     DG THORACOLUMABAR SPINE; Future -     traMADol (ULTRAM) 50 MG tablet; Take 1 tablet (50 mg total) by mouth every 8 (eight) hours as needed for moderate pain.  Vitamin D deficiency -     Vitamin D, 25-hydroxy  Need for hepatitis C screening test -     Hepatitis C antibody, reflex  Skin plaque -     ammonium lactate (LAC-HYDRIN) 12 % cream; Apply topically as needed for dry skin.   Health Maintenance Due  Topic  . Hepatitis C Screening   . PNEUMOCOCCAL POLYSACCHARIDE VACCINE (1)  . OPHTHALMOLOGY EXAM   . TETANUS/TDAP   . ZOSTAVAX    F/u in 3 months for diabetes   Dr. Armen Pickup

## 2015-01-23 NOTE — Progress Notes (Signed)
Patient here for fungus on toes, wants that to be looked at in regard to medication.  Patient states the medication has helped.  Patient has "spots" all over body that originated on feet.  She states they come and go, and "peel and leave a white spot like my skin is getting bleached."  Patient wants those to be looked at.  Patient out of Tylenol #3 and Tramadol, would like refill. Patient states back pain is 5/10, has to take 2-3 Aleve to relieve the pain. Patient does not want a flu shot. Patient takes naturemade diabetic vitamins, denies taking Vitamin D3.  Patient needs Glipizide, Tramadol, Tylenol #3 refilled.  Patient diabetic. CBG A1C

## 2015-01-24 LAB — VITAMIN D 25 HYDROXY (VIT D DEFICIENCY, FRACTURES): VIT D 25 HYDROXY: 22 ng/mL — AB (ref 30–100)

## 2015-01-25 ENCOUNTER — Telehealth: Payer: Self-pay | Admitting: *Deleted

## 2015-01-25 NOTE — Telephone Encounter (Signed)
Date of birth verified by pt HIV negative, results given  Pt verbalized understanding

## 2015-01-25 NOTE — Telephone Encounter (Signed)
-----   Message from Dessa Phi, MD sent at 01/24/2015  9:12 AM EDT ----- Screening HCV negative

## 2015-01-25 NOTE — Telephone Encounter (Signed)
Left message with female to return call 

## 2015-01-25 NOTE — Telephone Encounter (Signed)
Patient returned phone call. Please f/u  °

## 2015-02-02 ENCOUNTER — Telehealth: Payer: Self-pay | Admitting: *Deleted

## 2015-02-02 NOTE — Telephone Encounter (Signed)
LVM to return call.

## 2015-02-02 NOTE — Telephone Encounter (Signed)
-----   Message from Dessa PhiJosalyn Funches, MD sent at 01/24/2015  9:15 AM EDT ----- Vit D improved from 15 to 22 Take 2000 IU daily

## 2015-02-28 ENCOUNTER — Ambulatory Visit: Payer: BC Managed Care – PPO | Admitting: Podiatry

## 2015-03-29 ENCOUNTER — Ambulatory Visit: Payer: BC Managed Care – PPO | Admitting: Podiatry

## 2016-02-28 ENCOUNTER — Telehealth: Payer: Self-pay | Admitting: Family Medicine

## 2016-02-28 DIAGNOSIS — E118 Type 2 diabetes mellitus with unspecified complications: Secondary | ICD-10-CM

## 2016-02-28 MED ORDER — GLIPIZIDE 10 MG PO TABS: 10.0000 mg | ORAL_TABLET | Freq: Two times a day (BID) | ORAL | 0 refills | Status: DC

## 2016-02-28 MED FILL — glipiZIDE 10 MG TABS: 10 | 30 days supply | Qty: 60 | Fill #0

## 2016-02-28 NOTE — Telephone Encounter (Signed)
Pt. Came requesting a refill on glipiZIDE (GLUCOTROL) 10 MG tablet  Please f/u with pt.

## 2016-02-28 NOTE — Telephone Encounter (Signed)
Glipizide refilled but patient MUST have office visit for any further refills.

## 2016-04-23 ENCOUNTER — Emergency Department (HOSPITAL_COMMUNITY)
Admission: EM | Admit: 2016-04-23 | Discharge: 2016-04-24 | Disposition: A | Discharge status: Home / Self Care | Payer: BC Managed Care – PPO | Attending: Emergency Medicine | Admitting: Emergency Medicine

## 2016-04-23 ENCOUNTER — Encounter (HOSPITAL_COMMUNITY): Payer: Self-pay | Admitting: Emergency Medicine

## 2016-04-23 DIAGNOSIS — F17210 Nicotine dependence, cigarettes, uncomplicated: Secondary | ICD-10-CM | POA: Diagnosis not present

## 2016-04-23 DIAGNOSIS — E119 Type 2 diabetes mellitus without complications: Secondary | ICD-10-CM | POA: Insufficient documentation

## 2016-04-23 DIAGNOSIS — Z7982 Long term (current) use of aspirin: Secondary | ICD-10-CM | POA: Diagnosis not present

## 2016-04-23 DIAGNOSIS — Z7984 Long term (current) use of oral hypoglycemic drugs: Secondary | ICD-10-CM | POA: Insufficient documentation

## 2016-04-23 DIAGNOSIS — R112 Nausea with vomiting, unspecified: Secondary | ICD-10-CM | POA: Diagnosis present

## 2016-04-23 DIAGNOSIS — E860 Dehydration: Secondary | ICD-10-CM | POA: Insufficient documentation

## 2016-04-23 DIAGNOSIS — R197 Diarrhea, unspecified: Secondary | ICD-10-CM | POA: Insufficient documentation

## 2016-04-23 DIAGNOSIS — Z79899 Other long term (current) drug therapy: Secondary | ICD-10-CM | POA: Insufficient documentation

## 2016-04-23 LAB — CBC
HCT: 33.3 % — ABNORMAL LOW (ref 36.0–46.0)
Hemoglobin: 11.2 g/dL — ABNORMAL LOW (ref 12.0–15.0)
MCH: 32.8 pg (ref 26.0–34.0)
MCHC: 33.6 g/dL (ref 30.0–36.0)
MCV: 97.7 fL (ref 78.0–100.0)
PLATELETS: 318 10*3/uL (ref 150–400)
RBC: 3.41 MIL/uL — ABNORMAL LOW (ref 3.87–5.11)
RDW: 12.6 % (ref 11.5–15.5)
WBC: 9.5 10*3/uL (ref 4.0–10.5)

## 2016-04-23 LAB — BASIC METABOLIC PANEL
ANION GAP: 7 (ref 5–15)
BUN: 16 mg/dL (ref 6–20)
CO2: 21 mmol/L — ABNORMAL LOW (ref 22–32)
Calcium: 8.6 mg/dL — ABNORMAL LOW (ref 8.9–10.3)
Chloride: 106 mmol/L (ref 101–111)
Creatinine, Ser: 0.59 mg/dL (ref 0.44–1.00)
GFR calc Af Amer: >60 mL/min (ref >60)
Glucose, Bld: 262 mg/dL — ABNORMAL HIGH (ref 65–99)
Potassium: 3.8 mmol/L (ref 3.5–5.1)
Sodium: 134 mmol/L — ABNORMAL LOW (ref 135–145)

## 2016-04-23 MED ORDER — ONDANSETRON HCL 4 MG/2ML IJ SOLN
4.0000 mg | Freq: Once | INTRAMUSCULAR | Status: AC
  Administered 2016-04-23: 4 mg via INTRAVENOUS
  Filled 2016-04-23: qty 2

## 2016-04-23 MED ORDER — SODIUM CHLORIDE 0.9 % IV SOLN
1000.0000 mL | INTRAVENOUS | Status: DC
  Administered 2016-04-23: 1000 mL via INTRAVENOUS

## 2016-04-23 MED ORDER — MORPHINE SULFATE (PF) 4 MG/ML IV SOLN
4.0000 mg | Freq: Once | INTRAVENOUS | Status: AC
  Administered 2016-04-23: 4 mg via INTRAVENOUS
  Filled 2016-04-23: qty 1

## 2016-04-23 MED ORDER — SODIUM CHLORIDE 0.9 % IV SOLN
1000.0000 mL | Freq: Once | INTRAVENOUS | Status: AC
  Administered 2016-04-23: 1000 mL via INTRAVENOUS

## 2016-04-23 NOTE — ED Notes (Signed)
ED Provider at bedside. 

## 2016-04-23 NOTE — ED Triage Notes (Signed)
Pt comes from home via EMS with complaints of generalized abdominal pain and N&V that started at 0300 this morning.  Denies diarrhea.  Hyperglycemic at 336.  Unable to take meds today due to nausea.  Endorses body aches and chills.  Temp 99.6 in route.  A&O x4.  Ambulatory. Received 500 cc and 4 mg of zofran in route with some relief.

## 2016-04-23 NOTE — ED Notes (Signed)
[facility] Expected date:  Expected time:  Means of arrival:  Comments: EMS 66 yo, abd pain/IV meds

## 2016-04-23 NOTE — ED Notes (Signed)
Bed: [facility] Expected date:  Expected time:  Means of arrival:  Comments: 

## 2016-04-24 MED ORDER — ONDANSETRON 8 MG PO TBDP: 8.0000 mg | ORAL_TABLET | Freq: Three times a day (TID) | ORAL | 0 refills | Status: DC | PRN

## 2016-04-24 NOTE — ED Provider Notes (Signed)
[facility] DEPT Provider Note   CSN: [id number] Arrival date & time: 04/23/16  1822     History   Chief Complaint Chief Complaint  Patient presents with  . Abdominal Pain  . Emesis    HPI Ana Hines is a 66 y.o. female.  HPI Patient presents to the emergency department complaining of nausea vomiting diarrhea since this morning at 3:00.  She reports decreased oral intake.  She had a oral temperature 99.6 in route.  She reports myalgias fevers and chills.  Symptoms are moderate in severity.  Denies focal abdominal pain.  No back pain or flank pain.  No dysuria or urinary frequency.  Blood sugar was noted to be 336 by EMS.   Past Medical History:  Diagnosis Date  . Diabetes mellitus without complication (HCC) 20 years ago  . Hyperlipidemia   . Osteoporosis     Patient Active Problem List   Diagnosis Date Noted  . Skin plaque 01/23/2015  . Healthcare maintenance 08/25/2014  . Vitamin D deficiency 06/27/2014  . Underweight 06/24/2014  . Onychomycosis of toenail 05/27/2014  . Lung nodule 05/18/2014  . Compression fracture of T12 vertebra ([place]) 05/17/2014  . Syncope and collapse 05/17/2014  . Osteoporosis 05/25/2010  . Type 2 diabetes mellitus ([place]) 06/19/2006    Past Surgical History:  Procedure Laterality Date  . TUBAL LIGATION      OB History    No data available       Home Medications    Prior to Admission medications   Medication Sig Start Date End Date Taking? Authorizing Provider  aspirin-sod bicarb-citric acid (ALKA-SELTZER) 325 MG TBEF tablet Take 325 mg by mouth every 6 (six) hours as needed (indigestion).   Yes Historical Provider, MD  atorvastatin (LIPITOR) 40 MG tablet Take 1 tablet (40 mg total) by mouth daily. 05/31/14  Yes Josalyn Funches, MD  glipiZIDE (GLUCOTROL) 10 MG tablet Take 1 tablet (10 mg total) by mouth 2 (two) times daily. 02/28/16  Yes Josalyn Funches, MD  ACCU-CHEK SOFTCLIX LANCETS lancets 1 each by Other route 3 (three) times  daily. E11.9 08/25/14   Boykin Nearing, MD  alendronate (FOSAMAX) 70 MG tablet Take 1 tablet (70 mg total) by mouth once a week. Take with a full glass of water on an empty stomach. Patient not taking: Reported on 04/23/2016 06/24/14   Boykin Nearing, MD  ammonium lactate (LAC-HYDRIN) 12 % cream Apply topically as needed for dry skin. Patient not taking: Reported on 04/23/2016 01/23/15   Boykin Nearing, MD  Blood Glucose Monitoring Suppl (ACCU-CHEK AVIVA PLUS) W/DEVICE KIT 1 Device by Does not apply route 3 (three) times daily after meals. E11.9 08/25/14   Josalyn Funches, MD  feeding supplement, GLUCERNA SHAKE, (GLUCERNA SHAKE) LIQD Take 237 mLs by mouth 2 (two) times daily between meals. Patient not taking: Reported on 04/23/2016 05/19/14   Venetia Maxon Rama, MD  glucose blood (ACCU-CHEK AVIVA PLUS) test strip 1 each by Other route 3 (three) times daily. E11.9 08/25/14   Boykin Nearing, MD  Lancet Devices [facility]) lancets 1 each by Other route 3 (three) times daily. E11.9 08/25/14   Boykin Nearing, MD  Misc. Devices (ADJUSTABLE ALUMINUM CANE 7/8") MISC 1 each by Does not apply route as needed. Diagnosis code M48.54XA 05/27/14   Boykin Nearing, MD  ondansetron (ZOFRAN ODT) 8 MG disintegrating tablet Take 1 tablet (8 mg total) by mouth every 8 (eight) hours as needed for nausea or vomiting. 04/24/16   Jola Schmidt, MD  traMADol (  ULTRAM) 50 MG tablet Take 1 tablet (50 mg total) by mouth every 8 (eight) hours as needed for moderate pain. Patient not taking: Reported on 04/23/2016 01/23/15   Boykin Nearing, MD  zoster vaccine live, PF, (ZOSTAVAX) 72257 UNT/0.65ML injection Inject 19,400 Units into the skin once. Patient not taking: Reported on 04/23/2016 08/25/14   Boykin Nearing, MD    Family History Family History  Problem Relation Age of Onset  . Breast cancer Sister   . Ovarian cancer Cousin     Social History Social History  Substance Use Topics  . Smoking status: Current Every Day Smoker     Packs/day: 0.50    Types: Cigarettes  . Smokeless tobacco: Never Used  . Alcohol use 0.0 oz/week     Comment: 2-3 times a week     Allergies   Sulfonamide derivatives   Review of Systems Review of Systems  All other systems reviewed and are negative.    Physical Exam Updated Vital Signs BP 118/63   Pulse (!) 57   Temp 98.7 F (37.1 C) (Oral)   Resp 15   SpO2 97%   Physical Exam  Constitutional: She is oriented to person, place, and time. She appears well-developed and well-nourished. No distress.  HENT:  Head: Normocephalic and atraumatic.  Eyes: EOM are normal.  Neck: Normal range of motion.  Cardiovascular: Normal rate, regular rhythm and normal heart sounds.   Pulmonary/Chest: Effort normal and breath sounds normal.  Abdominal: Soft. She exhibits no distension. There is no tenderness.  Musculoskeletal: Normal range of motion.  Neurological: She is alert and oriented to person, place, and time.  Skin: Skin is warm and dry.  Psychiatric: She has a normal mood and affect. Judgment normal.  Nursing note and vitals reviewed.    ED Treatments / Results  Labs (all labs ordered are listed, but only abnormal results are displayed) Labs Reviewed  CBC - Abnormal; Notable for the following:       Result Value   RBC 3.41 (*)    Hemoglobin 11.2 (*)    HCT 33.3 (*)    All other components within normal limits  BASIC METABOLIC PANEL - Abnormal; Notable for the following:    Sodium 134 (*)    CO2 21 (*)    Glucose, Bld 262 (*)    Calcium 8.6 (*)    All other components within normal limits    EKG  EKG Interpretation None       Radiology No results found.  Procedures Procedures (including critical care time)  Medications Ordered in ED Medications  0.9 %  sodium chloride infusion (1,000 mLs Intravenous New Bag/Given 04/23/16 2152)    Followed by  0.9 %  sodium chloride infusion (1,000 mLs Intravenous New Bag/Given 04/23/16 2153)  ondansetron (ZOFRAN)  injection 4 mg (4 mg Intravenous Given 04/23/16 2153)  morphine 4 MG/ML injection 4 mg (4 mg Intravenous Given 04/23/16 2153)     Initial Impression / Assessment and Plan / ED Course  I have reviewed the triage vital signs and the nursing notes.  Pertinent labs & imaging results that were available during my care of the patient were reviewed by me and considered in my medical decision making (see chart for details).  Clinical Course     Feels better at this time.  Discharge home in good condition.  Repeat abdominal exam without tenderness.  Final Clinical Impressions(s) / ED Diagnoses   Final diagnoses:  Nausea vomiting and diarrhea  Dehydration  New Prescriptions New Prescriptions   ONDANSETRON (ZOFRAN ODT) 8 MG DISINTEGRATING TABLET    Take 1 tablet (8 mg total) by mouth every 8 (eight) hours as needed for nausea or vomiting.     Jola Schmidt, MD 04/24/16 [phone]

## 2016-06-24 ENCOUNTER — Ambulatory Visit: Payer: BC Managed Care – PPO | Attending: Family Medicine | Admitting: Family Medicine

## 2016-06-24 ENCOUNTER — Other Ambulatory Visit: Payer: Self-pay | Admitting: Pharmacist

## 2016-06-24 ENCOUNTER — Encounter: Payer: Self-pay | Admitting: Family Medicine

## 2016-06-24 VITALS — BP 135/73 | HR 87 | Temp 97.6°F | Ht 69.0 in | Wt 106.2 lb

## 2016-06-24 DIAGNOSIS — L988 Other specified disorders of the skin and subcutaneous tissue: Secondary | ICD-10-CM | POA: Diagnosis not present

## 2016-06-24 DIAGNOSIS — E1165 Type 2 diabetes mellitus with hyperglycemia: Secondary | ICD-10-CM

## 2016-06-24 DIAGNOSIS — E119 Type 2 diabetes mellitus without complications: Secondary | ICD-10-CM | POA: Diagnosis present

## 2016-06-24 DIAGNOSIS — Z79899 Other long term (current) drug therapy: Secondary | ICD-10-CM | POA: Insufficient documentation

## 2016-06-24 DIAGNOSIS — Z681 Body mass index (BMI) 19 or less, adult: Secondary | ICD-10-CM | POA: Diagnosis not present

## 2016-06-24 DIAGNOSIS — Z7984 Long term (current) use of oral hypoglycemic drugs: Secondary | ICD-10-CM | POA: Diagnosis not present

## 2016-06-24 DIAGNOSIS — E1169 Type 2 diabetes mellitus with other specified complication: Secondary | ICD-10-CM | POA: Diagnosis not present

## 2016-06-24 DIAGNOSIS — R21 Rash and other nonspecific skin eruption: Secondary | ICD-10-CM | POA: Insufficient documentation

## 2016-06-24 DIAGNOSIS — R636 Underweight: Secondary | ICD-10-CM | POA: Insufficient documentation

## 2016-06-24 DIAGNOSIS — M810 Age-related osteoporosis without current pathological fracture: Secondary | ICD-10-CM | POA: Insufficient documentation

## 2016-06-24 DIAGNOSIS — F17210 Nicotine dependence, cigarettes, uncomplicated: Secondary | ICD-10-CM | POA: Diagnosis not present

## 2016-06-24 LAB — GLUCOSE, POCT (MANUAL RESULT ENTRY): POC GLUCOSE: 248 mg/dL — AB (ref 70–99)

## 2016-06-24 LAB — POCT GLYCOSYLATED HEMOGLOBIN (HGB A1C): HEMOGLOBIN A1C: 8.9

## 2016-06-24 MED ORDER — AMMONIUM LACTATE 12 % EX CREA: TOPICAL_CREAM | CUTANEOUS | 11 refills | Status: DC | PRN

## 2016-06-24 MED ORDER — ATORVASTATIN CALCIUM 40 MG PO TABS: 40.0000 mg | ORAL_TABLET | Freq: Every day | ORAL | 3 refills | Status: DC

## 2016-06-24 MED ORDER — ASPIRIN EC 81 MG PO TBEC: 81.0000 mg | DELAYED_RELEASE_TABLET | Freq: Every day | ORAL | 11 refills | Status: AC

## 2016-06-24 MED ORDER — INSULIN PEN NEEDLE 31G X 8 MM MISC: 1.0000 "application " | Freq: Every day | 3 refills | Status: DC

## 2016-06-24 MED ORDER — TRIAMCINOLONE ACETONIDE 0.1 % EX CREA: 1.0000 "application " | TOPICAL_CREAM | Freq: Two times a day (BID) | CUTANEOUS | 0 refills | Status: DC

## 2016-06-24 MED ORDER — VITAMIN D3 50 MCG (2000 UT) PO TABS: 2000.0000 [IU] | ORAL_TABLET | Freq: Every day | ORAL | 11 refills | Status: AC

## 2016-06-24 MED ORDER — INSULIN DETEMIR 100 UNIT/ML FLEXPEN: 10.0000 [IU] | PEN_INJECTOR | Freq: Every day | SUBCUTANEOUS | 11 refills | Status: DC

## 2016-06-24 MED ORDER — ACCU-CHEK SOFTCLIX LANCETS MISC: 1.0000 | Freq: Every day | 3 refills | Status: DC

## 2016-06-24 MED ORDER — GLUCOSE BLOOD VI STRP: 1.0000 | ORAL_STRIP | Freq: Every day | 12 refills | Status: DC

## 2016-06-24 MED ORDER — INSULIN GLARGINE 100 UNIT/ML SOLOSTAR PEN: 10.0000 [IU] | PEN_INJECTOR | Freq: Every day | SUBCUTANEOUS | 11 refills | Status: DC

## 2016-06-24 MED ORDER — ALENDRONATE SODIUM 70 MG PO TABS: 70.0000 mg | ORAL_TABLET | ORAL | 3 refills | Status: DC

## 2016-06-24 MED ORDER — ACCU-CHEK SOFTCLIX LANCET DEV MISC: 1.0000 | Freq: Three times a day (TID) | 0 refills | Status: DC

## 2016-06-24 MED ORDER — CALCIUM CITRATE 200 MG PO TABS: 400.0000 mg | ORAL_TABLET | Freq: Every day | ORAL | 11 refills | Status: DC

## 2016-06-24 MED FILL — ALENDRONATE NA 70 MG TAB: 70 | 90 days supply | Qty: 12 | Fill #0

## 2016-06-24 MED FILL — LEVEMIR FLEXTOUCH 100 UNITS: 100 | 30 days supply | Qty: 3 | Fill #0

## 2016-06-24 MED FILL — ATORVASTATIN 40 MG TABLET: 40 | 90 days supply | Qty: 90 | Fill #0

## 2016-06-24 MED FILL — AMMONIUM LACTATE 12% CREAM: 12 | 15 days supply | Qty: 385 | Fill #0

## 2016-06-24 MED FILL — TRIAMCINOLONE 0.1% CREAM: 0.1 | 15 days supply | Qty: 30 | Fill #0

## 2016-06-24 NOTE — Assessment & Plan Note (Addendum)
Diabetes uncontrolled Low appetite Plan: Continue glipizide Add levemir 10 U daily Add aspirin 81 mg daily and Lipitor 40 mg daily  CBG monitoring

## 2016-06-24 NOTE — Progress Notes (Signed)
8Pt is here today to follow up on diabetes.

## 2016-06-24 NOTE — Addendum Note (Signed)
Addended by: Dessa PhiFUNCHES, Khaiden Segreto on: 06/24/2016 01:53 PM   Modules accepted: Orders

## 2016-06-24 NOTE — Assessment & Plan Note (Signed)
Topical steroid for psoriatic plaques

## 2016-06-24 NOTE — Patient Instructions (Addendum)
Ana CroftMildred was seen today for diabetes and follow-up.  Diagnoses and all orders for this visit:  Type 2 diabetes mellitus with hyperglycemia, without long-term current use of insulin (HCC) -     POCT glucose (manual entry) -     POCT glycosylated hemoglobin (Hb A1C) -     Microalbumin/Creatinine Ratio, Urine -     Lancet Devices (ACCU-CHEK SOFTCLIX) lancets; 1 each by Other route 3 (three) times daily. E11.9 -     glucose blood (ACCU-CHEK AVIVA PLUS) test strip; 1 each by Other route daily. E11.9 -     ACCU-CHEK SOFTCLIX LANCETS lancets; 1 each by Other route daily. E11.9 -     atorvastatin (LIPITOR) 40 MG tablet; Take 1 tablet (40 mg total) by mouth daily. -     aspirin EC 81 MG tablet; Take 1 tablet (81 mg total) by mouth daily. -     Insulin Glargine (LANTUS SOLOSTAR) 100 UNIT/ML Solostar Pen; Inject 10 Units into the skin daily. -     Ambulatory referral to Ophthalmology -     Insulin Pen Needle (B-D ULTRAFINE III SHORT PEN) 31G X 8 MM MISC; 1 application by Does not apply route daily.  Age-related osteoporosis without current pathological fracture -     alendronate (FOSAMAX) 70 MG tablet; Take 1 tablet (70 mg total) by mouth once a week. Take with a full glass of water on an empty stomach. -     Calcium Citrate 200 MG TABS; Take 2 tablets (400 mg total) by mouth daily. -     Cholecalciferol (VITAMIN D3) 2000 units TABS; Take 2,000 Units by mouth daily.  Skin plaque -     triamcinolone cream (KENALOG) 0.1 %; Apply 1 application topically 2 (two) times daily. -     ammonium lactate (LAC-HYDRIN) 12 % cream; Apply topically as needed for dry skin.   Diabetes blood sugar goals  Fasting (in AM before breakfast, 8 hrs of no eating or drinking (except water or unsweetened coffee or tea): 90-130 2 hrs after meals: < 160,   No low sugars: nothing < 70   F/u in 6 weeks for diabetes   Dr. Armen PickupFunches

## 2016-06-24 NOTE — Progress Notes (Signed)
Patient ID: Ana Hines, female   DOB: 1950-11-18, 66 y.o.   MRN: [id number]   Subjective:  Patient ID: Ana Hines, female    DOB: [birth year]  Age: 66 y.o. MRN: [id number]  CC: Diabetes and Follow-up   HPI Ana Hines has diabetes, osteoporosis, underweight she presents to re-establish care  for  1. CHRONIC DIABETES  Disease Monitoring  Blood Sugar Ranges:   Polyuria: no   Visual problems: yes, blurry and double vision at times. Overdue for eye exam.   Medication Compliance: she was out for the majority of 2017. She was traveling to [place], [place].  Medication Side Effects  Hypoglycemia: no  He appetite is poor. She eats two slices of buttered toast, yogurt 1-5 cups of, sliced apples with peanut butter. Ensure, 6 oz bottle, 1 per day.   2. Emergency room follow up: she was seen in ED on 04/23/2016 for nausea vomiting and diarrhea. Symptoms were for 3 days prior to ED visit.  She was treated with IV fluids, Zofran and morphine. She had normal WBC, she had hyperglycemia to 262, slightly reduced sodium of 134. Her symptoms have since completely resolved.    3. Skin rash: for one year. Non pruritic, non painful. Gets plaques that she peels off and underlying skin is pale or slightly erythematous. Skin is dry. No bleeding.   4. Osteoporosis:has some back  pain with prolonged standing. No taking fosamax. Vit D or calcium.   Social History  Substance Use Topics  . Smoking status: Current Every Day Smoker    Packs/day: 0.50    Types: Cigarettes  . Smokeless tobacco: Never Used  . Alcohol use 0.0 oz/week     Comment: 2-3 times a week   Outpatient Medications Prior to Visit  Medication Sig Dispense Refill  . ACCU-CHEK SOFTCLIX LANCETS lancets 1 each by Other route 3 (three) times daily. E11.9 100 each 12  . Blood Glucose Monitoring Suppl (ACCU-CHEK AVIVA PLUS) W/DEVICE KIT 1 Device by Does not apply route 3 (three) times daily after meals. E11.9 1 kit 0  . glipiZIDE (GLUCOTROL) 10  MG tablet Take 1 tablet (10 mg total) by mouth 2 (two) times daily. 60 tablet 0  . glucose blood (ACCU-CHEK AVIVA PLUS) test strip 1 each by Other route 3 (three) times daily. E11.9 100 each 12  . Lancet Devices (ACCU-CHEK SOFTCLIX) lancets 1 each by Other route 3 (three) times daily. E11.9 1 each 0  . Misc. Devices (ADJUSTABLE ALUMINUM CANE 7/8") MISC 1 each by Does not apply route as needed. Diagnosis code M48.54XA 1 each 0  . alendronate (FOSAMAX) 70 MG tablet Take 1 tablet (70 mg total) by mouth once a week. Take with a full glass of water on an empty stomach. (Patient not taking: Reported on 04/23/2016) 12 tablet 3  . ammonium lactate (LAC-HYDRIN) 12 % cream Apply topically as needed for dry skin. (Patient not taking: Reported on 04/23/2016) 385 g 0  . aspirin-sod bicarb-citric acid (ALKA-SELTZER) 325 MG TBEF tablet Take 325 mg by mouth every 6 (six) hours as needed (indigestion).    Marland Kitchen atorvastatin (LIPITOR) 40 MG tablet Take 1 tablet (40 mg total) by mouth daily. (Patient not taking: Reported on 06/24/2016) 90 tablet 3  . feeding supplement, GLUCERNA SHAKE, (GLUCERNA SHAKE) LIQD Take 237 mLs by mouth 2 (two) times daily between meals. (Patient not taking: Reported on 04/23/2016) 12 Can 11  . ondansetron (ZOFRAN ODT) 8 MG disintegrating tablet Take 1 tablet (8 mg total)  by mouth every 8 (eight) hours as needed for nausea or vomiting. (Patient not taking: Reported on 06/24/2016) 10 tablet 0  . traMADol (ULTRAM) 50 MG tablet Take 1 tablet (50 mg total) by mouth every 8 (eight) hours as needed for moderate pain. (Patient not taking: Reported on 04/23/2016) 90 tablet 0  . zoster vaccine live, PF, (ZOSTAVAX) 38466 UNT/0.65ML injection Inject 19,400 Units into the skin once. (Patient not taking: Reported on 04/23/2016) 1 each 0   No facility-administered medications prior to visit.     ROS Review of Systems  Constitutional: Negative for chills and fever.  Eyes: Negative for visual disturbance.  Respiratory:  Negative for shortness of breath.   Cardiovascular: Negative for chest pain.  Gastrointestinal: Negative for abdominal pain and blood in stool.  Musculoskeletal: Positive for back pain. Negative for arthralgias.  Skin: Positive for rash.  Allergic/Immunologic: Negative for immunocompromised state.  Hematological: Negative for adenopathy. Does not bruise/bleed easily.  Psychiatric/Behavioral: Negative for dysphoric mood and suicidal ideas.    Objective:  BP 135/73 (BP Location: Left Arm, Patient Position: Sitting, Cuff Size: Small)   Pulse 87   Temp 97.6 F (36.4 C) (Oral)   Ht '5\' 9"'  (1.753 m)   Wt 106 lb 3.2 oz (48.2 kg)   SpO2 99%   BMI 15.68 kg/m   BP/Weight 06/24/2016 04/24/2016 59/12/3568  Systolic BP 177 939 030  Diastolic BP 73 63 66  Wt. (Lbs) 106.2 - 108  BMI 15.68 - 15.94   Physical Exam  Constitutional: She is oriented to person, place, and time. She appears well-developed. No distress.  Thin AA female   HENT:  Head: Normocephalic and atraumatic.  Cardiovascular: Normal rate, regular rhythm, normal heart sounds and intact distal pulses.   Pulmonary/Chest: Effort normal and breath sounds normal.  Neurological: She is alert and oriented to person, place, and time.  Skin: Skin is warm and dry. Rash noted.  Xerotic with hyperkerototic plaques on legs   Psychiatric: She has a normal mood and affect.   Lab Results  Component Value Date   HGBA1C 8.9 06/24/2016   CBG 248  Assessment & Plan:   Problem List Items Addressed This Visit      High   Type 2 diabetes mellitus ([place]) - Primary (Chronic)    Diabetes uncontrolled Low appetite Plan: Continue glipizide Add lantus 10 U daily Add aspirin 81 mg daily and Lipitor 40 mg daily  CBG monitoring       Relevant Medications   Lancet Devices (ACCU-CHEK SOFTCLIX) lancets   glucose blood (ACCU-CHEK AVIVA PLUS) test strip   ACCU-CHEK SOFTCLIX LANCETS lancets   atorvastatin (LIPITOR) 40 MG tablet   aspirin EC 81 MG  tablet   Insulin Glargine (LANTUS SOLOSTAR) 100 UNIT/ML Solostar Pen   Insulin Pen Needle (B-D ULTRAFINE III SHORT PEN) 31G X 8 MM MISC   Other Relevant Orders   POCT glucose (manual entry) (Completed)   POCT glycosylated hemoglobin (Hb A1C) (Completed)   Microalbumin/Creatinine Ratio, Urine   Ambulatory referral to Ophthalmology   Osteoporosis (Chronic)    With hx of pathological fracture Restart fosamax Vit D and calcium       Relevant Medications   alendronate (FOSAMAX) 70 MG tablet   Calcium Citrate 200 MG TABS   Cholecalciferol (VITAMIN D3) 2000 units TABS     Unprioritized   Skin plaque    Topical steroid for psoriatic plaques       Relevant Medications   triamcinolone cream (KENALOG) 0.1 %  ammonium lactate (LAC-HYDRIN) 12 % cream      No orders of the defined types were placed in this encounter.   Follow-up: Return in about 6 weeks (around 08/05/2016) for diabetes .   Boykin Nearing MD

## 2016-06-24 NOTE — Assessment & Plan Note (Signed)
With hx of pathological fracture Restart fosamax Vit D and calcium

## 2016-06-25 LAB — MICROALBUMIN / CREATININE URINE RATIO
CREATININE, URINE: 113 mg/dL (ref 20–320)
Microalb Creat Ratio: 16 mcg/mg creat (ref <30)
Microalb, Ur: 1.8 mg/dL

## 2016-06-26 ENCOUNTER — Telehealth: Payer: Self-pay

## 2016-06-26 NOTE — Telephone Encounter (Signed)
Pt was called and their is no VM set up to leave a message. °

## 2016-06-27 ENCOUNTER — Telehealth: Payer: Self-pay

## 2016-06-27 NOTE — Telephone Encounter (Signed)
Pt was called and informed of lab results. 

## 2016-07-01 ENCOUNTER — Other Ambulatory Visit: Payer: Self-pay | Admitting: Family Medicine

## 2016-07-01 ENCOUNTER — Other Ambulatory Visit: Payer: Self-pay

## 2016-07-01 DIAGNOSIS — E1165 Type 2 diabetes mellitus with hyperglycemia: Secondary | ICD-10-CM

## 2016-07-01 MED ORDER — ACCU-CHEK AVIVA PLUS W/DEVICE KIT: PACK | 0 refills | Status: DC

## 2016-08-05 ENCOUNTER — Ambulatory Visit: Payer: BC Managed Care – PPO | Attending: Family Medicine | Admitting: Family Medicine

## 2016-08-05 ENCOUNTER — Encounter: Payer: Self-pay | Admitting: Family Medicine

## 2016-08-05 VITALS — BP 133/73 | HR 69 | Temp 97.6°F | Ht 69.0 in | Wt 107.0 lb

## 2016-08-05 DIAGNOSIS — H532 Diplopia: Secondary | ICD-10-CM | POA: Diagnosis not present

## 2016-08-05 DIAGNOSIS — F17210 Nicotine dependence, cigarettes, uncomplicated: Secondary | ICD-10-CM | POA: Insufficient documentation

## 2016-08-05 DIAGNOSIS — Z7982 Long term (current) use of aspirin: Secondary | ICD-10-CM | POA: Insufficient documentation

## 2016-08-05 DIAGNOSIS — R21 Rash and other nonspecific skin eruption: Secondary | ICD-10-CM | POA: Diagnosis not present

## 2016-08-05 DIAGNOSIS — M810 Age-related osteoporosis without current pathological fracture: Secondary | ICD-10-CM | POA: Insufficient documentation

## 2016-08-05 DIAGNOSIS — E119 Type 2 diabetes mellitus without complications: Secondary | ICD-10-CM | POA: Insufficient documentation

## 2016-08-05 DIAGNOSIS — E1165 Type 2 diabetes mellitus with hyperglycemia: Secondary | ICD-10-CM

## 2016-08-05 DIAGNOSIS — Z79899 Other long term (current) drug therapy: Secondary | ICD-10-CM | POA: Insufficient documentation

## 2016-08-05 DIAGNOSIS — E1169 Type 2 diabetes mellitus with other specified complication: Secondary | ICD-10-CM | POA: Diagnosis not present

## 2016-08-05 DIAGNOSIS — Z7984 Long term (current) use of oral hypoglycemic drugs: Secondary | ICD-10-CM | POA: Insufficient documentation

## 2016-08-05 LAB — GLUCOSE, POCT (MANUAL RESULT ENTRY): POC Glucose: 247 mg/dl — AB (ref 70–99)

## 2016-08-05 MED ORDER — GLIPIZIDE 10 MG PO TABS: 10.0000 mg | ORAL_TABLET | Freq: Two times a day (BID) | ORAL | 5 refills | Status: DC

## 2016-08-05 MED ORDER — ACCU-CHEK AVIVA PLUS W/DEVICE KIT: PACK | 0 refills | Status: DC

## 2016-08-05 MED ORDER — ACCU-CHEK SOFTCLIX LANCET DEV MISC: 1.0000 | Freq: Three times a day (TID) | 0 refills | Status: DC

## 2016-08-05 MED ORDER — GLUCOSE BLOOD VI STRP
1.0000 | ORAL_STRIP | Freq: Every day | 11 refills | Status: DC
Start: 2016-08-05 — End: 2017-08-20

## 2016-08-05 MED ORDER — GLUCOSE BLOOD VI STRP: 1.0000 | ORAL_STRIP | Freq: Every day | 12 refills | Status: DC

## 2016-08-05 MED ORDER — ACCU-CHEK SOFTCLIX LANCETS MISC: 1.0000 | Freq: Three times a day (TID) | 11 refills | Status: DC

## 2016-08-05 MED FILL — ONE TOUCH ULTRA TEST STRIPS: 30 days supply | Qty: 100 | Fill #0

## 2016-08-05 MED FILL — ONE TOUCH ULTRAMINI METER: W/DEVICE | 1 days supply | Qty: 1 | Fill #0

## 2016-08-05 MED FILL — glipiZIDE 10 MG TABS: 10 | 30 days supply | Qty: 60 | Fill #0

## 2016-08-05 MED FILL — ONE TOUCH DELICA 33G LANCET: 30 days supply | Qty: 100 | Fill #0

## 2016-08-05 NOTE — Progress Notes (Signed)
Patient ID: Ana Hines, female   DOB: Aug 27, 1950, 66 y.o.   MRN: [id number]   Subjective:  Patient ID: Ana Hines, female    DOB: 1950-07-30  Age: 66 y.o. MRN: [id number]  CC: Diabetes   HPI JALASIA ESKRIDGE has diabetes, osteoporosis, underweight she presents  for  1. CHRONIC DIABETES  Disease Monitoring  Blood Sugar Ranges:   Polyuria: no   Visual problems: yes, blurry and double vision at times. Overdue for eye exam.   Medication Compliance: taking glipizide. No levemir due to not being able to check CBGs.  Medication Side Effects  Hypoglycemia: no    2. Skin rash: on legs R >L. Improved with kenalog cream and lac hydrin.   Social History  Substance Use Topics  . Smoking status: Current Every Day Smoker    Packs/day: 0.50    Types: Cigarettes  . Smokeless tobacco: Never Used  . Alcohol use 0.0 oz/week     Comment: 2-3 times a week   Outpatient Medications Prior to Visit  Medication Sig Dispense Refill  . ACCU-CHEK SOFTCLIX LANCETS lancets 1 each by Other route daily. E11.9 100 each 3  . alendronate (FOSAMAX) 70 MG tablet Take 1 tablet (70 mg total) by mouth once a week. Take with a full glass of water on an empty stomach. 12 tablet 3  . ammonium lactate (LAC-HYDRIN) 12 % cream Apply topically as needed for dry skin. 385 g 11  . aspirin EC 81 MG tablet Take 1 tablet (81 mg total) by mouth daily. 30 tablet 11  . aspirin-sod bicarb-citric acid (ALKA-SELTZER) 325 MG TBEF tablet Take 325 mg by mouth every 6 (six) hours as needed (indigestion).    Marland Kitchen atorvastatin (LIPITOR) 40 MG tablet Take 1 tablet (40 mg total) by mouth daily. 90 tablet 3  . Blood Glucose Monitoring Suppl (ACCU-CHEK AVIVA PLUS) w/Device KIT USE AS DIRECTED BY PHYSICIAN 1 kit 0  . Calcium Citrate 200 MG TABS Take 2 tablets (400 mg total) by mouth daily. 60 tablet 11  . Cholecalciferol (VITAMIN D3) 2000 units TABS Take 2,000 Units by mouth daily. 30 tablet 11  . glipiZIDE (GLUCOTROL) 10 MG tablet Take 1  tablet (10 mg total) by mouth 2 (two) times daily. 60 tablet 0  . glucose blood (ACCU-CHEK AVIVA PLUS) test strip 1 each by Other route daily. E11.9 100 each 12  . Insulin Detemir (LEVEMIR) 100 UNIT/ML Pen Inject 10 Units into the skin daily at 10 pm. 15 mL 11  . Insulin Pen Needle (B-D ULTRAFINE III SHORT PEN) 31G X 8 MM MISC 1 application by Does not apply route daily. 90 each 3  . Lancet Devices (ACCU-CHEK SOFTCLIX) lancets 1 each by Other route 3 (three) times daily. E11.9 1 each 0  . Misc. Devices (ADJUSTABLE ALUMINUM CANE 7/8") MISC 1 each by Does not apply route as needed. Diagnosis code M48.54XA 1 each 0  . triamcinolone cream (KENALOG) 0.1 % Apply 1 application topically 2 (two) times daily. 30 g 0   No facility-administered medications prior to visit.     ROS Review of Systems  Constitutional: Negative for chills and fever.  Eyes: Negative for visual disturbance.  Respiratory: Negative for shortness of breath.   Cardiovascular: Negative for chest pain.  Gastrointestinal: Negative for abdominal pain and blood in stool.  Musculoskeletal: Positive for back pain. Negative for arthralgias.  Skin: Positive for rash.  Allergic/Immunologic: Negative for immunocompromised state.  Hematological: Negative for adenopathy. Does not bruise/bleed easily.  Psychiatric/Behavioral: Negative for dysphoric mood and suicidal ideas.    Objective:  BP 133/73   Pulse 69   Temp 97.6 F (36.4 C) (Oral)   Ht _0  (1.753 m)   Wt 107 lb (48.5 kg)   SpO2 100%   BMI 15.80 kg/m   BP/Weight 08/05/2016 0/10/3708 09/22/6946  Systolic BP 546 270 350  Diastolic BP 73 73 63  Wt. (Lbs) 107 106.2 -  BMI 15.8 15.68 -   Physical Exam  Constitutional: She is oriented to person, place, and time. She appears well-developed. No distress.  Thin AA female   HENT:  Head: Normocephalic and atraumatic.  Cardiovascular: Normal rate, regular rhythm, normal heart sounds and intact distal pulses.   Pulmonary/Chest:  Effort normal and breath sounds normal.  Neurological: She is alert and oriented to person, place, and time.  Skin: Skin is warm and dry. Rash noted.  Slightly erythematous plaques on legs   Psychiatric: She has a normal mood and affect.   Lab Results  Component Value Date   HGBA1C 8.9 06/24/2016   CBG 247 Assessment & Plan:   Problem List Items Addressed This Visit      High   Type 2 diabetes mellitus (HCC) - Primary (Chronic)   Relevant Medications   Lancet Devices (ACCU-CHEK SOFTCLIX) lancets   glipiZIDE (GLUCOTROL) 10 MG tablet   ACCU-CHEK SOFTCLIX LANCETS lancets   Blood Glucose Monitoring Suppl (ACCU-CHEK AVIVA PLUS) w/Device KIT   glucose blood (ACCU-CHEK AVIVA PLUS) test strip   Other Relevant Orders   Glucose (CBG) (Completed)      No orders of the defined types were placed in this encounter.   Follow-up: Return in about 4 weeks (around 09/02/2016) for diabetes .   Boykin Nearing MD

## 2016-08-05 NOTE — Assessment & Plan Note (Signed)
Remains uncontrolled with diabetes Patient has not started Levemir  Plan: Start Levemir CBG monitoring

## 2016-08-05 NOTE — Patient Instructions (Addendum)
Ana Hines was seen today for diabetes.  Diagnoses and all orders for this visit:  Type 2 diabetes mellitus with other specified complication, unspecified whether long term insulin use (HCC) -     Glucose (CBG) -     Discontinue: Blood Glucose Monitoring Suppl (ACCU-CHEK AVIVA PLUS) w/Device KIT; USE AS DIRECTED BY PHYSICIAN -     Discontinue: glucose blood (ACCU-CHEK AVIVA PLUS) test strip; 1 each by Other route daily. E11.9 -     Lancet Devices (ACCU-CHEK SOFTCLIX) lancets; 1 each by Other route 3 (three) times daily. E11.9 -     glipiZIDE (GLUCOTROL) 10 MG tablet; Take 1 tablet (10 mg total) by mouth 2 (two) times daily. -     Blood Glucose Monitoring Suppl (ACCU-CHEK AVIVA PLUS) w/Device KIT; USE AS DIRECTED BY PHYSICIAN -     glucose blood (ACCU-CHEK AVIVA PLUS) test strip; 1 each by Other route daily. E11.9  Type 2 diabetes mellitus with hyperglycemia, without long-term current use of insulin (HCC) -     ACCU-CHEK SOFTCLIX LANCETS lancets; 1 each by Other route 3 (three) times daily. E11.9   Diabetes blood sugar goals  Fasting (in AM before breakfast, 8 hrs of no eating or drinking (except water or unsweetened coffee or tea): 90-130 2 hrs after meals: < 160,   No low sugars: nothing < 70   f.u in 4 weeks for diabetes   Dr. Adrian Blackwater

## 2016-09-04 ENCOUNTER — Encounter: Payer: Self-pay | Admitting: Family Medicine

## 2016-09-12 ENCOUNTER — Telehealth: Payer: Self-pay | Admitting: Family Medicine

## 2016-09-12 NOTE — Telephone Encounter (Signed)
Pt. Called requesting to speak with her nurse regarding her Insulin Detemir (LEVEMIR) 100 UNIT/ML Pen  Pt. States that she is not used to taking the pen. Please f/u with pt.

## 2016-09-18 MED FILL — ALENDRONATE NA 70 MG TAB: 70 | 90 days supply | Qty: 12 | Fill #1

## 2016-09-23 MED FILL — glipiZIDE 10 MG TABS: 10 | 30 days supply | Qty: 60 | Fill #1

## 2016-10-10 ENCOUNTER — Other Ambulatory Visit: Payer: Self-pay

## 2016-10-10 MED ORDER — ATORVASTATIN CALCIUM 20 MG PO TABS: 40.0000 mg | ORAL_TABLET | Freq: Every day | ORAL | 3 refills | Status: DC

## 2016-10-10 MED FILL — ATORVASTATIN 20 MG TABLET: 20 | 30 days supply | Qty: 60 | Fill #0

## 2016-11-29 ENCOUNTER — Other Ambulatory Visit: Payer: Self-pay | Admitting: Pharmacist

## 2016-11-29 DIAGNOSIS — E1165 Type 2 diabetes mellitus with hyperglycemia: Secondary | ICD-10-CM

## 2016-11-29 MED ORDER — ATORVASTATIN CALCIUM 40 MG PO TABS: 40.0000 mg | ORAL_TABLET | Freq: Every day | ORAL | 3 refills | Status: DC

## 2016-11-29 NOTE — Telephone Encounter (Signed)
Patient prescribed atorvastatin 20 mg 2 tablets daily. Preferred by insurance is 40 mg 1 tablet daily. Will change to 40 mg.

## 2016-12-03 MED FILL — ONE TOUCH ULTRA TEST STRIPS: 30 days supply | Qty: 100 | Fill #1

## 2016-12-10 MED FILL — ALENDRONATE NA 70 MG TAB: 70 | 90 days supply | Qty: 12 | Fill #2

## 2016-12-10 MED FILL — glipiZIDE 10 MG TABS: 10 | 30 days supply | Qty: 60 | Fill #2

## 2017-01-09 MED FILL — glipiZIDE 10 MG TABS: 10 | 30 days supply | Qty: 60 | Fill #3

## 2017-01-09 MED FILL — ATORVASTATIN 40 MG TABLET: 40 | 30 days supply | Qty: 30 | Fill #0

## 2017-02-20 MED FILL — ATORVASTATIN 40 MG TABLET: 40 | 30 days supply | Qty: 30 | Fill #1

## 2017-03-05 MED FILL — ALENDRONATE NA 70 MG TAB: 70 | 90 days supply | Qty: 12 | Fill #3

## 2017-03-11 ENCOUNTER — Ambulatory Visit: Payer: BC Managed Care – PPO | Admitting: Nurse Practitioner

## 2017-04-09 MED FILL — ATORVASTATIN 40 MG TABLET: 40 | 30 days supply | Qty: 30 | Fill #2

## 2017-04-09 MED FILL — glipiZIDE 10 MG TABS: 10 | 30 days supply | Qty: 60 | Fill #4

## 2017-05-12 ENCOUNTER — Other Ambulatory Visit: Payer: Self-pay | Admitting: Pharmacist

## 2017-05-12 ENCOUNTER — Ambulatory Visit: Payer: Medicare Other | Attending: Nurse Practitioner | Admitting: Nurse Practitioner

## 2017-05-12 ENCOUNTER — Encounter: Payer: Self-pay | Admitting: Nurse Practitioner

## 2017-05-12 VITALS — BP 151/73 | HR 69 | Temp 97.2°F | Resp 12 | Ht 69.0 in | Wt 112.6 lb

## 2017-05-12 DIAGNOSIS — R030 Elevated blood-pressure reading, without diagnosis of hypertension: Secondary | ICD-10-CM | POA: Diagnosis not present

## 2017-05-12 DIAGNOSIS — E785 Hyperlipidemia, unspecified: Secondary | ICD-10-CM | POA: Diagnosis not present

## 2017-05-12 DIAGNOSIS — Z716 Tobacco abuse counseling: Secondary | ICD-10-CM | POA: Insufficient documentation

## 2017-05-12 DIAGNOSIS — Z882 Allergy status to sulfonamides status: Secondary | ICD-10-CM | POA: Diagnosis not present

## 2017-05-12 DIAGNOSIS — M810 Age-related osteoporosis without current pathological fracture: Secondary | ICD-10-CM | POA: Diagnosis not present

## 2017-05-12 DIAGNOSIS — R040 Epistaxis: Secondary | ICD-10-CM | POA: Insufficient documentation

## 2017-05-12 DIAGNOSIS — M4854XA Collapsed vertebra, not elsewhere classified, thoracic region, initial encounter for fracture: Secondary | ICD-10-CM | POA: Diagnosis not present

## 2017-05-12 DIAGNOSIS — L988 Other specified disorders of the skin and subcutaneous tissue: Secondary | ICD-10-CM

## 2017-05-12 DIAGNOSIS — M549 Dorsalgia, unspecified: Secondary | ICD-10-CM | POA: Diagnosis not present

## 2017-05-12 DIAGNOSIS — Z794 Long term (current) use of insulin: Secondary | ICD-10-CM | POA: Insufficient documentation

## 2017-05-12 DIAGNOSIS — Z1231 Encounter for screening mammogram for malignant neoplasm of breast: Secondary | ICD-10-CM

## 2017-05-12 DIAGNOSIS — Z79899 Other long term (current) drug therapy: Secondary | ICD-10-CM | POA: Diagnosis not present

## 2017-05-12 DIAGNOSIS — Z1211 Encounter for screening for malignant neoplasm of colon: Secondary | ICD-10-CM

## 2017-05-12 DIAGNOSIS — S22080A Wedge compression fracture of T11-T12 vertebra, initial encounter for closed fracture: Secondary | ICD-10-CM

## 2017-05-12 DIAGNOSIS — Z7982 Long term (current) use of aspirin: Secondary | ICD-10-CM | POA: Diagnosis not present

## 2017-05-12 DIAGNOSIS — E1169 Type 2 diabetes mellitus with other specified complication: Secondary | ICD-10-CM | POA: Insufficient documentation

## 2017-05-12 DIAGNOSIS — F17210 Nicotine dependence, cigarettes, uncomplicated: Secondary | ICD-10-CM | POA: Diagnosis not present

## 2017-05-12 DIAGNOSIS — F17200 Nicotine dependence, unspecified, uncomplicated: Secondary | ICD-10-CM | POA: Diagnosis not present

## 2017-05-12 DIAGNOSIS — E1165 Type 2 diabetes mellitus with hyperglycemia: Secondary | ICD-10-CM | POA: Insufficient documentation

## 2017-05-12 DIAGNOSIS — E559 Vitamin D deficiency, unspecified: Secondary | ICD-10-CM | POA: Diagnosis not present

## 2017-05-12 LAB — POCT GLYCOSYLATED HEMOGLOBIN (HGB A1C): HEMOGLOBIN A1C: 7.8

## 2017-05-12 LAB — GLUCOSE, POCT (MANUAL RESULT ENTRY): POC GLUCOSE: 291 mg/dL — AB (ref 70–99)

## 2017-05-12 MED ORDER — TRIAMCINOLONE ACETONIDE 0.1 % EX CREA: 1.0000 "application " | TOPICAL_CREAM | Freq: Two times a day (BID) | CUTANEOUS | 1 refills | Status: DC

## 2017-05-12 MED ORDER — GLIPIZIDE 10 MG PO TABS: 10.0000 mg | ORAL_TABLET | Freq: Two times a day (BID) | ORAL | 5 refills | Status: DC

## 2017-05-12 MED ORDER — AMMONIUM LACTATE 12 % EX CREA: TOPICAL_CREAM | CUTANEOUS | 11 refills | Status: DC | PRN

## 2017-05-12 MED ORDER — GLUCERNA CARBSTEADY PO LIQD: 237.0000 mL | Freq: Every day | ORAL | 12 refills | Status: DC | PRN

## 2017-05-12 MED ORDER — INSULIN DETEMIR 100 UNIT/ML FLEXPEN: 10.0000 [IU] | PEN_INJECTOR | Freq: Every day | SUBCUTANEOUS | 11 refills | Status: DC

## 2017-05-12 MED ORDER — PNEUMOCOCCAL 13-VAL CONJ VACC IM SUSP: INTRAMUSCULAR | 0 refills | Status: DC

## 2017-05-12 MED ORDER — GLUCERNA CARBSTEADY PO LIQD
237.0000 mL | Freq: Every day | ORAL | 12 refills | Status: DC | PRN
Start: 2017-05-12 — End: 2017-05-12

## 2017-05-12 MED FILL — PREVNAR 13 SYRINGE: 1 days supply | Qty: 1 | Fill #0

## 2017-05-12 NOTE — Patient Instructions (Addendum)
Coping With Diabetes Diabetes (type 1 diabetes mellitus or type 2 diabetes mellitus) is a condition in which the body does not have enough of a hormone called insulin, or the body does not respond properly to insulin. Normally, insulin allows sugars (glucose) to enter cells in the body. The cells use glucose for energy. With diabetes, extra glucose builds up in the blood instead of going into cells, which results in high blood glucose (hyperglycemia). How to manage lifestyle changes Managing diabetes includes medical treatments as well as lifestyle changes. If diabetes is not managed well, serious physical and emotional complications can occur. Taking good care of yourself means that you are responsible for:  Monitoring glucose regularly.  Eating a healthy diet.  Exercising regularly.  Meeting with health care providers.  Taking medicines as directed.  Some people may feel a lot of stress about managing their diabetes. This is known as emotional distress, and it is very common. Living with diabetes can place you at risk for emotional distress, depression, or anxiety. These disorders can be confusing and can make diabetes management more difficult. How to recognize stress Emotional distress Symptoms of emotional distress include:  Anger about having a diagnosis of diabetes.  Fear or frustration about your diagnosis and the changes you need to make to manage the condition.  Being overly worried about the care that you need or the cost of the care you need.  Feeling like you caused your condition by doing something wrong.  Fear of unpredictable situations, like low or high blood glucose.  Feeling judged by your health care providers.  Feeling very alone with the disease.  Getting too tired or "burned out" with the demands of daily care.  Depression Having diabetes means that you are at a higher risk for depression. Having depression also means that you are at a higher risk for  diabetes. Your health care provider may test (screen) you for symptoms of depression. It is important to recognize depression symptoms and to start treatment for it soon after it is diagnosed. The following are some symptoms of depression:  Loss of interest in things that you used to enjoy.  Trouble sleeping, or often waking up early and not being able to get back to sleep.  A change in appetite.  Feeling tired most of the day.  Feeling nervous and anxious.  Feeling guilty and worrying that you are a burden to others.  Feeling depressed more often than you do not feel that way.  Thoughts of hurting yourself or feeling that you want to die.  If you have any of these symptoms for 2 weeks or longer, reach out to a health care provider. Where to find support  Ask your health care provider to recommend a therapist who understands both depression and diabetes.  Search for information and support from the American Diabetes Association: www.diabetes.org  Find a certified diabetes educator and make an appointment through American Association of Diabetes Educators: www.diabeteseducator.org Follow these instructions at home: Managing emotional distress The following are some ways to manage emotional distress:  Talk with your health care provider or certified diabetes educator. Consider working with a counselor or therapist.  Learn as much as you can about diabetes and its treatment. Meet with a certified diabetes educator or take a class to learn how to manage your condition.  Keep a journal of your thoughts and concerns.  Accept that some things are out of your control.  Talk with other people who have diabetes. It   can help to talk with others about the emotional distress that you feel.  Find ways to manage stress that work for you. These may include art or music therapy, exercise, meditation, and hobbies.  Seek support from spiritual leaders, family, and friends.  General  instructions  Follow your diabetes management plan.  Keep all follow-up visits as told by your health care provider. This is important. Get help right away if:  You have thoughts about hurting yourself or others. If you ever feel like you may hurt yourself or others, or have thoughts about taking your own life, get help right away. You can go to your nearest emergency department or call:  Your local emergency services (911 in the [place]  A suicide crisis helpline, such as the National Suicide Prevention Lifeline at 1-800-273-8255. This is open 24 hours a day.  Summary  Diabetes (type 1 diabetes mellitus or type 2 diabetes mellitus) is a condition in which the body does not have enough of a hormone called insulin, or the body does not respond properly to insulin.  Living with diabetes puts you at risk for medical issues, and it also puts you at risk for emotional issues such as emotional distress, depression, and anxiety.  Recognizing the symptoms of emotional distress and depression may help you avoid problems with your diabetes control. It is important to start treatment for emotional distress and depression soon after they are diagnosed.  Having diabetes means that you are at a higher risk for depression. Ask your health care provider to recommend a therapist who understands both depression and diabetes.  If you experience symptoms of emotional distress or depression, it is important to discuss this with your health care provider, certified diabetes educator, or therapist. This information is not intended to replace advice given to you by your health care provider. Make sure you discuss any questions you have with your health care provider. Document Released: 08/22/2016 Document Revised: 08/22/2016 Document Reviewed: 08/22/2016 Elsevier Interactive Patient Education  2018 Elsevier Inc.  Diabetes Mellitus and Nutrition When you have diabetes (diabetes mellitus), it is very important  to have healthy eating habits because your blood sugar (glucose) levels are greatly affected by what you eat and drink. Eating healthy foods in the appropriate amounts, at about the same times every day, can help you:  Control your blood glucose.  Lower your risk of heart disease.  Improve your blood pressure.  Reach or maintain a healthy weight.  Every person with diabetes is different, and each person has different needs for a meal plan. Your health care provider may recommend that you work with a diet and nutrition specialist (dietitian) to make a meal plan that is best for you. Your meal plan may vary depending on factors such as:  The calories you need.  The medicines you take.  Your weight.  Your blood glucose, blood pressure, and cholesterol levels.  Your activity level.  Other health conditions you have, such as heart or kidney disease.  How do carbohydrates affect me? Carbohydrates affect your blood glucose level more than any other type of food. Eating carbohydrates naturally increases the amount of glucose in your blood. Carbohydrate counting is a method for keeping track of how many carbohydrates you eat. Counting carbohydrates is important to keep your blood glucose at a healthy level, especially if you use insulin or take certain oral diabetes medicines. It is important to know how many carbohydrates you can safely have in each meal. This is different for   every person. Your dietitian can help you calculate how many carbohydrates you should have at each meal and for snack. Foods that contain carbohydrates include:  Bread, cereal, rice, pasta, and crackers.  Potatoes and corn.  Peas, beans, and lentils.  Milk and yogurt.  Fruit and juice.  Desserts, such as cakes, cookies, ice cream, and candy.  How does alcohol affect me? Alcohol can cause a sudden decrease in blood glucose (hypoglycemia), especially if you use insulin or take certain oral diabetes medicines.  Hypoglycemia can be a life-threatening condition. Symptoms of hypoglycemia (sleepiness, dizziness, and confusion) are similar to symptoms of having too much alcohol. If your health care provider says that alcohol is safe for you, follow these guidelines:  Limit alcohol intake to no more than 1 drink per day for nonpregnant women and 2 drinks per day for men. One drink equals 12 oz of beer, 5 oz of wine, or 1 oz of hard liquor.  Do not drink on an empty stomach.  Keep yourself hydrated with water, diet soda, or unsweetened iced tea.  Keep in mind that regular soda, juice, and other mixers may contain a lot of sugar and must be counted as carbohydrates.  What are tips for following this plan? Reading food labels  Start by checking the serving size on the label. The amount of calories, carbohydrates, fats, and other nutrients listed on the label are based on one serving of the food. Many foods contain more than one serving per package.  Check the total grams (g) of carbohydrates in one serving. You can calculate the number of servings of carbohydrates in one serving by dividing the total carbohydrates by 15. For example, if a food has 30 g of total carbohydrates, it would be equal to 2 servings of carbohydrates.  Check the number of grams (g) of saturated and trans fats in one serving. Choose foods that have low or no amount of these fats.  Check the number of milligrams (mg) of sodium in one serving. Most people should limit total sodium intake to less than 2,300 mg per day.  Always check the nutrition information of foods labeled as "low-fat" or "nonfat". These foods may be higher in added sugar or refined carbohydrates and should be avoided.  Talk to your dietitian to identify your daily goals for nutrients listed on the label. Shopping  Avoid buying canned, premade, or processed foods. These foods tend to be high in fat, sodium, and added sugar.  Shop around the outside edge of the  grocery store. This includes fresh fruits and vegetables, bulk grains, fresh meats, and fresh dairy. Cooking  Use low-heat cooking methods, such as baking, instead of high-heat cooking methods like deep frying.  Cook using healthy oils, such as olive, canola, or sunflower oil.  Avoid cooking with butter, cream, or high-fat meats. Meal planning  Eat meals and snacks regularly, preferably at the same times every day. Avoid going long periods of time without eating.  Eat foods high in fiber, such as fresh fruits, vegetables, beans, and whole grains. Talk to your dietitian about how many servings of carbohydrates you can eat at each meal.  Eat 4-6 ounces of lean protein each day, such as lean meat, chicken, fish, eggs, or tofu. 1 ounce is equal to 1 ounce of meat, chicken, or fish, 1 egg, or 1/4 cup of tofu.  Eat some foods each day that contain healthy fats, such as avocado, nuts, seeds, and fish. Lifestyle   Check your blood   glucose regularly.  Exercise at least 30 minutes 5 or more days each week, or as told by your health care provider.  Take medicines as told by your health care provider.  Do not use any products that contain nicotine or tobacco, such as cigarettes and e-cigarettes. If you need help quitting, ask your health care provider.  Work with a counselor or diabetes educator to identify strategies to manage stress and any emotional and social challenges. What are some questions to ask my health care provider?  Do I need to meet with a diabetes educator?  Do I need to meet with a dietitian?  What number can I call if I have questions?  When are the best times to check my blood glucose? Where to find more information:  American Diabetes Association: diabetes.org/food-and-fitness/food  Academy of Nutrition and Dietetics: www.eatright.org/resources/health/diseases-and-conditions/diabetes  National Institute of Diabetes and Digestive and Kidney Diseases (NIH):  www.niddk.nih.gov/health-information/diabetes/overview/diet-eating-physical-activity Summary  A healthy meal plan will help you control your blood glucose and maintain a healthy lifestyle.  Working with a diet and nutrition specialist (dietitian) can help you make a meal plan that is best for you.  Keep in mind that carbohydrates and alcohol have immediate effects on your blood glucose levels. It is important to count carbohydrates and to use alcohol carefully. This information is not intended to replace advice given to you by your health care provider. Make sure you discuss any questions you have with your health care provider. Document Released: 01/03/2005 Document Revised: 05/13/2016 Document Reviewed: 05/13/2016 Elsevier Interactive Patient Education  2018 Elsevier Inc.  

## 2017-05-12 NOTE — Progress Notes (Signed)
Assessment & Plan:  Ana Hines was seen today for establish care.  Diagnoses and all orders for this visit:  Type 2 diabetes mellitus with other specified complication, unspecified whether long term insulin use (HCC) -     Glucose (CBG) -     HgB A1c -     Ambulatory referral to Podiatry -     glipiZIDE (GLUCOTROL) 10 MG tablet; Take 1 tablet (10 mg total) by mouth 2 (two) times daily. -     Ambulatory referral to Ophthalmology -     Basic metabolic panel -     Microalbumin / creatinine urine ratio Continue blood sugar control as discussed in office today, low carbohydrate diet, and regular physical exercise as tolerated, 150 minutes per week (30 min each day, 5 days per week, or 50 min 3 days per week).   Age related osteoporosis, unspecified pathological fracture presence Continue Fosamax, calcium and Vitamin D as prescribed  Skin plaque -     ammonium lactate (LAC-HYDRIN) 12 % cream; Apply topically as needed for dry skin. -     triamcinolone cream (KENALOG) 0.1 %; Apply 1 application topically 2 (two) times daily.  Type 2 diabetes mellitus with hyperglycemia, without long-term current use of insulin (HCC) -     Lipid panel  Colon cancer screening -     Ambulatory referral to Gastroenterology  Breast cancer screening by mammogram -     MM DIGITAL SCREENING BILATERAL; Future  Vitamin D deficiency -     VITAMIN D 25 Hydroxy (Vit-D Deficiency, Fractures)  Tobacco dependence Ana Hines was counseled on the dangers of tobacco use, and was advised to quit. Reviewed strategies to maximize success, including removing cigarettes and smoking materials from environment, stress management and support of family/friends as well as pharmacological alternatives including: Wellbutrin, Chantix, Nicotine patch, Nicotine gum or lozenges. Smoking cessation support: smoking cessation hotline: 1-800-QUIT-NOW.  Smoking cessation classes are also available through [facility].  Call [phone] or visit our website at [email or web address]   Spent 5 minutes counseling on smoking cessation and patient is not ready to quit.   Compression fracture of T12 vertebra ([place]) -     DG THORACOLUMABAR SPINE; Future May use XS tylenol for back pain Avoid excessive use of NSAIDs May apply heat and alternate with ice application as needed for back pain  Other orders -     Nutritional Supplements (GLUCERNA CARBSTEADY) LIQD; Take 237 mLs by mouth daily as needed. For meal replacement. Please provide as covered by insurance.    Patient has been counseled on age-appropriate routine health concerns for screening and prevention. These are reviewed and up-to-date. Referrals have been placed accordingly. Immunizations are up-to-date or declined.    Subjective:   Chief Complaint  Patient presents with  . Establish Care    Patient is here to establish care. Patient stated she's been having nose bleed for 2 weeks. Patient stated it happens every other day when she wakes up or random.    HPI Ana Hines 67 y.o. female presents to office today to establish care.    Epistaxis Onset about 2 weeks ago. She does take 17m ASA daily. Also takes aleve for back pain and recently was also taking alka seltzer cold relief.  The first onset of nosebleed was over a week ago when she was taking alka seltzer cold. She states that she blew her nose really hard and had a small clot of blood in her tissue. The next  day later she blew her nose and there was increased blood in the tissue. Last week she experienced an actual 5 minute episode of epistaxis. The bleeding stopped on its own and she hasn't experienced any additional occurrences. She has also stopped taking the alka seltzer cold as well. She denies any headaches.    Elevated Blood Pressure She does not have a history of hypertension. She smoked 2 cigarettes this morning prior to her office visit. She endorses increased stress at home. Her  alcoholic son and his girlfriend are living with her in her home. She denies chest pain, shortness of breath, palpitations, lightheadedness, dizziness, headaches or BLE edema.  Will continue to monitor BP for trending.  BP Readings from Last 3 Encounters:  05/12/17 (!) 151/73  08/05/16 133/73  06/24/16 135/73    Tobacco Dependence Started smoking at the age of 62. Smokes 1/2 ppd of cigarettes. She is not ready to quit. She has quit before during all of her past pregnancies.   Osteoporosis She is taking Fosamax as prescribed. Diagnosed with Osteoporosis over 15 years ago. She also has a history of T12 compression fracture (2016; fell at home). Has taken oxycodone and was switched to tylenol 3 and tramadol in the past for pain. Currently only takes aleve which provides minimal relief of pain. She has also worked with PT in the past to help relieve back pain.  Currently requires the use of no assistive devices although she has used a cane and walker in the past for mobility.   Diabetes Mellitus She reports the Levemir pen she can not use because "I can see the pen needle going into my body". SHE HAS NOT TAKEN LEVEMIR IN 10 MONTHS. We will try a 3 month trial off levemir and see how she does on glipizide. She is aware that she must continue with a healthy diet and medication compliance.  She takes the glipizide usually as prescribed however when she sees begins to run low on her medication she will take it once a day instead of twice a day to save on costs. She checks her blood sugar only on Wednesdays. Highs 180s lows 130s.  Reportedly lost 70lbs on metformin so was taken off. She denies any symptoms of polyneuropathy, hypo or hyperglycemia. We may try her on Januvia if her A1c trends up next 3 months.  Lab Results  Component Value Date   HGBA1C 7.8 DOWN FROM 8. 9 on 06-24-2016 05/12/2017   Review of Systems  Constitutional: Negative for fever, malaise/fatigue and weight loss.       Decreased  appetite  HENT: Positive for nosebleeds (resolved).   Eyes: Negative.  Negative for blurred vision, double vision and photophobia.  Respiratory: Positive for cough and sputum production. Negative for hemoptysis, shortness of breath and wheezing.   Cardiovascular: Negative.  Negative for chest pain, palpitations and leg swelling.  Gastrointestinal: Negative.  Negative for abdominal pain, constipation, diarrhea, heartburn, nausea and vomiting.  Musculoskeletal: Positive for back pain. Negative for myalgias.  Skin: Positive for rash.  Neurological: Negative.  Negative for dizziness, focal weakness, seizures and headaches.  Endo/Heme/Allergies: Negative for environmental allergies.  Psychiatric/Behavioral: Negative for suicidal ideas. The patient is not nervous/anxious.     Past Medical History:  Diagnosis Date  . Diabetes mellitus without complication (HCC) 20 years ago  . Hyperlipidemia   . Osteoporosis     Past Surgical History:  Procedure Laterality Date  . TUBAL LIGATION      Family History  Problem  Relation Age of Onset  . Breast cancer Sister   . Ovarian cancer Cousin     Social History Reviewed with no changes to be made today.   Outpatient Medications Prior to Visit  Medication Sig Dispense Refill  . ACCU-CHEK SOFTCLIX LANCETS lancets 1 each by Other route 3 (three) times daily. E11.9 100 each 11  . alendronate (FOSAMAX) 70 MG tablet Take 1 tablet (70 mg total) by mouth once a week. Take with a full glass of water on an empty stomach. 12 tablet 3  . aspirin EC 81 MG tablet Take 1 tablet (81 mg total) by mouth daily. 30 tablet 11  . atorvastatin (LIPITOR) 40 MG tablet Take 1 tablet (40 mg total) by mouth daily. 90 tablet 3  . Blood Glucose Monitoring Suppl (ACCU-CHEK AVIVA PLUS) w/Device KIT USE AS DIRECTED BY PHYSICIAN 1 kit 0  . Cholecalciferol (VITAMIN D3) 2000 units TABS Take 2,000 Units by mouth daily. 30 tablet 11  . glucose blood (ACCU-CHEK AVIVA PLUS) test strip 1  each by Other route daily. E11.9 100 each 11  . Lancet Devices (ACCU-CHEK SOFTCLIX) lancets 1 each by Other route 3 (three) times daily. E11.9 1 each 0  . glipiZIDE (GLUCOTROL) 10 MG tablet Take 1 tablet (10 mg total) by mouth 2 (two) times daily. 60 tablet 5  . aspirin-sod bicarb-citric acid (ALKA-SELTZER) 325 MG TBEF tablet Take 325 mg by mouth every 6 (six) hours as needed (indigestion).    . Calcium Citrate 200 MG TABS Take 2 tablets (400 mg total) by mouth daily. (Patient not taking: Reported on 08/05/2016) 60 tablet 11  . Insulin Pen Needle (B-D ULTRAFINE III SHORT PEN) 31G X 8 MM MISC 1 application by Does not apply route daily. (Patient not taking: Reported on 08/05/2016) 90 each 3  . Misc. Devices (ADJUSTABLE ALUMINUM CANE 7/8") MISC 1 each by Does not apply route as needed. Diagnosis code M48.54XA (Patient not taking: Reported on 08/05/2016) 1 each 0  . ammonium lactate (LAC-HYDRIN) 12 % cream Apply topically as needed for dry skin. (Patient not taking: Reported on 05/12/2017) 385 g 11  . Insulin Detemir (LEVEMIR) 100 UNIT/ML Pen Inject 10 Units into the skin daily at 10 pm. (Patient not taking: Reported on 08/05/2016) 15 mL 11  . triamcinolone cream (KENALOG) 0.1 % Apply 1 application topically 2 (two) times daily. (Patient not taking: Reported on 05/12/2017) 30 g 0   No facility-administered medications prior to visit.     Allergies  Allergen Reactions  . Sulfonamide Derivatives        Objective:    BP (!) 151/73 (BP Location: Left Arm, Patient Position: Sitting, Cuff Size: Normal)   Pulse 69   Temp (!) 97.2 F (36.2 C) (Oral)   Resp 12   Ht _0  (1.753 m)   Wt 112 lb 9.6 oz (51.1 kg)   SpO2 100%   BMI 16.63 kg/m  Wt Readings from Last 3 Encounters:  05/12/17 112 lb 9.6 oz (51.1 kg)  08/05/16 107 lb (48.5 kg)  06/24/16 106 lb 3.2 oz (48.2 kg)    Physical Exam  Constitutional: She is oriented to person, place, and time. She appears well-developed and well-nourished. She  is cooperative.  HENT:  Head: Normocephalic and atraumatic.  Eyes: EOM are normal.  Neck: Normal range of motion.  Cardiovascular: Normal rate, regular rhythm, normal heart sounds and intact distal pulses. Exam reveals no gallop and no friction rub.  No murmur heard. Pulmonary/Chest: Effort normal. No  tachypnea. No respiratory distress. She has no decreased breath sounds. She has no wheezes. She has rhonchi in the right middle field, the right lower field, the left middle field and the left lower field. She has no rales. She exhibits no tenderness.  Abdominal: Soft. Bowel sounds are normal.  Musculoskeletal: Normal range of motion. She exhibits no edema.  Neurological: She is alert and oriented to person, place, and time. Coordination normal.  Skin: Skin is warm and dry. Lesion and rash noted. Rash is macular.  Scaly macular lesions on left leg.   Psychiatric: She has a normal mood and affect. Her behavior is normal. Judgment and thought content normal.  Nursing note and vitals reviewed.     Patient has been counseled extensively about nutrition and exercise as well as the importance of adherence with medications and regular follow-up. The patient was given clear instructions to go to ER or return to medical center if symptoms don't improve, worsen or new problems develop. The patient verbalized understanding.   Follow-up: Return in about 3 months (around 08/10/2017) for DM needs 30 minutes.   [name], FNP-BC [facility], [place]   05/12/2017, 1:38 PM

## 2017-05-12 NOTE — Progress Notes (Signed)
poct

## 2017-05-13 LAB — MICROALBUMIN / CREATININE URINE RATIO
CREATININE, UR: 59.5 mg/dL
MICROALB/CREAT RATIO: 13.6 mg/g{creat} (ref 0.0–30.0)
MICROALBUM., U, RANDOM: 8.1 ug/mL

## 2017-05-13 LAB — BASIC METABOLIC PANEL
BUN/Creatinine Ratio: 16 (ref 12–28)
BUN: 10 mg/dL (ref 8–27)
CALCIUM: 9.6 mg/dL (ref 8.7–10.3)
CO2: 23 mmol/L (ref 20–29)
Chloride: 99 mmol/L (ref 96–106)
Creatinine, Ser: 0.64 mg/dL (ref 0.57–1.00)
GFR, EST AFRICAN AMERICAN: 108 mL/min/{1.73_m2} (ref >59)
GFR, EST NON AFRICAN AMERICAN: 94 mL/min/{1.73_m2} (ref >59)
Glucose: 283 mg/dL — ABNORMAL HIGH (ref 65–99)
POTASSIUM: 4.5 mmol/L (ref 3.5–5.2)
Sodium: 138 mmol/L (ref 134–144)

## 2017-05-13 LAB — LIPID PANEL
Chol/HDL Ratio: 3.1 ratio (ref 0.0–4.4)
Cholesterol, Total: 154 mg/dL (ref 100–199)
HDL: 49 mg/dL (ref >39)
LDL CALC: 85 mg/dL (ref 0–99)
TRIGLYCERIDES: 101 mg/dL (ref 0–149)
VLDL CHOLESTEROL CAL: 20 mg/dL (ref 5–40)

## 2017-05-13 LAB — VITAMIN D 25 HYDROXY (VIT D DEFICIENCY, FRACTURES): Vit D, 25-Hydroxy: 33.3 ng/mL (ref 30.0–100.0)

## 2017-05-14 ENCOUNTER — Telehealth: Payer: Self-pay

## 2017-05-14 NOTE — Telephone Encounter (Signed)
CMA called patient to inform on lab result.  Patient understood.  

## 2017-05-14 NOTE — Telephone Encounter (Signed)
-----   Message from Claiborne RiggZelda W Fleming, NP sent at 05/13/2017 11:50 PM EST ----- Vitamin D is normal. Continue OTC vitamin D. Lipid panel is normal. Electrolytes are normal aside from glucose which was discussed during your office visit.

## 2017-05-16 ENCOUNTER — Ambulatory Visit (HOSPITAL_COMMUNITY)
Admission: RE | Admit: 2017-05-16 | Discharge: 2017-05-16 | Disposition: A | Discharge status: Home / Self Care | Payer: Medicare Other | Source: Ambulatory Visit | Attending: Nurse Practitioner | Admitting: Nurse Practitioner

## 2017-05-16 DIAGNOSIS — I700 Atherosclerosis of aorta: Secondary | ICD-10-CM | POA: Insufficient documentation

## 2017-05-16 DIAGNOSIS — S22080A Wedge compression fracture of T11-T12 vertebra, initial encounter for closed fracture: Secondary | ICD-10-CM

## 2017-05-16 DIAGNOSIS — M4854XA Collapsed vertebra, not elsewhere classified, thoracic region, initial encounter for fracture: Secondary | ICD-10-CM | POA: Diagnosis present

## 2017-05-19 ENCOUNTER — Other Ambulatory Visit: Payer: Self-pay | Admitting: Nurse Practitioner

## 2017-05-19 DIAGNOSIS — M4850XA Collapsed vertebra, not elsewhere classified, site unspecified, initial encounter for fracture: Secondary | ICD-10-CM

## 2017-05-20 ENCOUNTER — Telehealth: Payer: Self-pay

## 2017-05-20 NOTE — Telephone Encounter (Signed)
-----   Message from Claiborne RiggZelda W Fleming, NP sent at 05/19/2017 12:59 AM EST ----- Your compression fracture has worsened. Will place referral for spine specialists.

## 2017-05-20 NOTE — Telephone Encounter (Signed)
CMA called patient regarding results.  Patient understood.  Patient would like to know if she can have Tramadol for her pain.

## 2017-05-21 NOTE — Telephone Encounter (Signed)
Patient scheduled a lab appointment for tomorrow.  Would you like her to get the lab work done and fill out the pain contract agreement?

## 2017-05-21 NOTE — Telephone Encounter (Signed)
CMA called patient inform on PCP advising and patient understand to make a lap appointment for fill a controlled substance agreement for Tramadol.

## 2017-05-21 NOTE — Telephone Encounter (Signed)
-----   Message from Paschal DoppVanessa J White sent at 05/21/2017  1:43 PM EST ----- I see that this patient is needing to come in for labs according to notes in her chart. I do not see any orders in for lab work or UDS. If you don't mind putting her lab orders in so that when she comes in tomorrow everything will be set for her. Thanks.

## 2017-05-21 NOTE — Telephone Encounter (Signed)
Yes she needs to make a lab appointment prior to me prescribing tramadol and come in to fill out controlled substance agreement. She can fill out the agreement when she comes for labs.  Thank you

## 2017-05-22 ENCOUNTER — Ambulatory Visit: Payer: Medicare Other | Attending: Nurse Practitioner

## 2017-05-22 ENCOUNTER — Other Ambulatory Visit: Payer: Self-pay | Admitting: Nurse Practitioner

## 2017-05-22 DIAGNOSIS — Z0283 Encounter for blood-alcohol and blood-drug test: Secondary | ICD-10-CM

## 2017-05-22 NOTE — Telephone Encounter (Signed)
Noted  

## 2017-05-22 NOTE — Progress Notes (Signed)
Patient here for lab visit only 

## 2017-05-22 NOTE — Telephone Encounter (Signed)
Patient came to get her lab done and filled out her pain contact agreement.

## 2017-05-22 NOTE — Telephone Encounter (Signed)
Yes - thank you

## 2017-05-23 ENCOUNTER — Other Ambulatory Visit: Payer: Self-pay | Admitting: Pharmacist

## 2017-05-29 ENCOUNTER — Telehealth: Payer: Self-pay | Admitting: Nurse Practitioner

## 2017-05-29 ENCOUNTER — Ambulatory Visit
Admission: RE | Admit: 2017-05-29 | Discharge: 2017-05-29 | Disposition: A | Discharge status: Home / Self Care | Payer: Medicare Other | Source: Ambulatory Visit | Attending: Nurse Practitioner | Admitting: Nurse Practitioner

## 2017-05-29 DIAGNOSIS — Z1231 Encounter for screening mammogram for malignant neoplasm of breast: Secondary | ICD-10-CM

## 2017-05-29 LAB — TOXASSURE SELECT 13 (MW), URINE

## 2017-05-29 NOTE — Telephone Encounter (Signed)
Patient called and asked about the Tramadol, the patient Stated you and her talked about on the last visit per her back pain. Please fu with pt. CB# [phone]

## 2017-06-02 NOTE — Telephone Encounter (Signed)
Please let patient know the lab test was positive for marijuana and I will not be able to prescribe tramadol. Although marijuana is legal in many states as it is illegal still in [place] we are not able to prescribe controlled substances with positive toxicology screens.

## 2017-06-02 NOTE — Telephone Encounter (Signed)
CMA called patient regarding mammogram results and patient understood.  Cma informed patient on PCP reason why Tramadol cannot be given.   Patient understood.

## 2017-06-02 NOTE — Telephone Encounter (Signed)
-----   Message from Claiborne RiggZelda W Fleming, NP sent at 06/02/2017 12:44 AM EST ----- Mammogram was normal. Follow up next year

## 2017-06-09 MED FILL — ATORVASTATIN 40 MG TABLET: 40 | 30 days supply | Qty: 30 | Fill #3

## 2017-06-09 MED FILL — glipiZIDE 10 MG TABS: 10 | 30 days supply | Qty: 60 | Fill #0

## 2017-06-24 ENCOUNTER — Ambulatory Visit: Payer: Medicare Other | Admitting: Podiatry

## 2017-06-24 ENCOUNTER — Encounter: Payer: Self-pay | Admitting: Podiatry

## 2017-06-24 DIAGNOSIS — E119 Type 2 diabetes mellitus without complications: Secondary | ICD-10-CM | POA: Diagnosis not present

## 2017-06-24 DIAGNOSIS — M79674 Pain in right toe(s): Secondary | ICD-10-CM

## 2017-06-24 DIAGNOSIS — M79675 Pain in left toe(s): Secondary | ICD-10-CM | POA: Diagnosis not present

## 2017-06-24 DIAGNOSIS — B351 Tinea unguium: Secondary | ICD-10-CM

## 2017-06-24 NOTE — Progress Notes (Signed)
   Subjective:    Patient ID: Ana GuyMildred R Hines, female    DOB: 06/29/1950, 67 y.o.   MRN: [id number]  HPI this patient presents the office with severe pain noted in the big toenail, right foot.  She says this area is painful walking and wearing her shoes.  She has not had her nails treated for months.  This patient is a type II diabetic taking medication and insulin.  She presents the office today for preventative foot care services.    Review of Systems  All other systems reviewed and are negative.      Objective:   Physical Exam General Appearance  Alert, conversant and in no acute stress.  Vascular  Dorsalis pedis and posterior tibial  pulses are palpable  bilaterally.  Capillary return is within normal limits  bilaterally. Temperature is within normal limits  bilaterally.  Neurologic  Senn-Weinstein monofilament wire test within normal limits  bilaterally. Muscle power within normal limits bilaterally.  Nails Thick disfigured discolored nails with subungual debris  from hallux to fifth toes bilaterally. No evidence of bacterial infection or drainage bilaterally.  Orthopedic  No limitations of motion of motion feet .  No crepitus or effusions noted.  HAV  B/L.  Skin  normotropic skin with no porokeratosis noted bilaterally.  No signs of infections or ulcers noted.          Assessment & Plan:  Onychomycosis  B/L  Diabetes with no foot complications.  IE  Debridement of nails  X 10.  Diabetic foot exam was found revealing her vascular, neurologic and orthopedic findings to be within normal limits except for HAV first MPJ bilaterally.  RTC 3 months   Helane GuntherGregory Benno Brensinger DPM

## 2017-07-08 MED FILL — ATORVASTATIN 40 MG TABLET: 40 | 30 days supply | Qty: 30 | Fill #4

## 2017-07-08 MED FILL — glipiZIDE 10 MG TABS: 10 | 30 days supply | Qty: 60 | Fill #1

## 2017-07-09 ENCOUNTER — Ambulatory Visit: Payer: BC Managed Care – PPO | Admitting: Podiatry

## 2017-07-17 ENCOUNTER — Ambulatory Visit: Payer: Medicare Other | Attending: Nurse Practitioner | Admitting: Physician Assistant

## 2017-07-17 VITALS — BP 113/68 | HR 72 | Temp 98.6°F | Resp 18 | Ht 69.0 in | Wt 109.0 lb

## 2017-07-17 DIAGNOSIS — E119 Type 2 diabetes mellitus without complications: Secondary | ICD-10-CM | POA: Insufficient documentation

## 2017-07-17 DIAGNOSIS — M67432 Ganglion, left wrist: Secondary | ICD-10-CM | POA: Insufficient documentation

## 2017-07-17 DIAGNOSIS — Z7982 Long term (current) use of aspirin: Secondary | ICD-10-CM | POA: Diagnosis not present

## 2017-07-17 DIAGNOSIS — Z882 Allergy status to sulfonamides status: Secondary | ICD-10-CM | POA: Diagnosis not present

## 2017-07-17 DIAGNOSIS — E785 Hyperlipidemia, unspecified: Secondary | ICD-10-CM | POA: Diagnosis not present

## 2017-07-17 DIAGNOSIS — Z79899 Other long term (current) drug therapy: Secondary | ICD-10-CM | POA: Insufficient documentation

## 2017-07-17 DIAGNOSIS — E1169 Type 2 diabetes mellitus with other specified complication: Secondary | ICD-10-CM

## 2017-07-17 DIAGNOSIS — Z794 Long term (current) use of insulin: Secondary | ICD-10-CM | POA: Insufficient documentation

## 2017-07-17 DIAGNOSIS — M6740 Ganglion, unspecified site: Secondary | ICD-10-CM

## 2017-07-17 LAB — GLUCOSE, POCT (MANUAL RESULT ENTRY): POC Glucose: 203 mg/dl — AB (ref 70–99)

## 2017-07-17 MED ORDER — MELOXICAM 7.5 MG PO TABS: 7.5000 mg | ORAL_TABLET | Freq: Every day | ORAL | 0 refills | Status: DC

## 2017-07-17 MED FILL — MELOXICAM 7.5 MG TABLET: 7.5 | 30 days supply | Qty: 30 | Fill #0

## 2017-07-17 NOTE — Progress Notes (Signed)
Patient ID: KEVONNA NOLTE, female   DOB: 01-26-1951, 67 y.o.   MRN: [id number]   Gelsey Amyx, is a 67 y.o. female  IRW:[id number]  PYP:[id number]  DOB - 09-Nov-1950  Subjective:  Chief Complaint and HPI: Amonie Wisser is a 67 y.o. female here today for a lump in her L wrist.  She noticed it about 10 days ago when she bent her wrist to support herslef getting out of a chair.   +mild pain.    ROS:   Constitutional:  No f/c, No night sweats, No unexplained weight loss. EENT:  No vision changes, No blurry vision, No hearing changes. No mouth, throat, or ear problems.  Respiratory: No cough, No SOB Cardiac: No CP, no palpitations GI:  No abd pain, No N/V/D. GU: No Urinary s/sx Musculoskeletal:  Mild wrist pain Neuro: No headache, no dizziness, no motor weakness.  Skin: No rash Endocrine:  No polydipsia. No polyuria.  Psych: Denies SI/HI  No problems updated.  ALLERGIES: Allergies  Allergen Reactions  . Sulfa Antibiotics Other (See Comments)    hives  . Sulfonamide Derivatives     PAST MEDICAL HISTORY: Past Medical History:  Diagnosis Date  . Diabetes mellitus without complication (HCC) 20 years ago  . Hyperlipidemia   . Osteoporosis     MEDICATIONS AT HOME: Prior to Admission medications   Medication Sig Start Date End Date Taking? Authorizing Provider  ACCU-CHEK SOFTCLIX LANCETS lancets 1 each by Other route 3 (three) times daily. E11.9 08/05/16   Funches, Adriana Mccallum, MD  alendronate (FOSAMAX) 70 MG tablet Take 1 tablet (70 mg total) by mouth once a week. Take with a full glass of water on an empty stomach. 06/24/16   Funches, Adriana Mccallum, MD  ammonium lactate (LAC-HYDRIN) 12 % cream Apply topically as needed for dry skin. 05/12/17   [name], NP  aspirin EC 81 MG tablet Take 1 tablet (81 mg total) by mouth daily. 06/24/16   Funches, Adriana Mccallum, MD  aspirin-sod bicarb-citric acid (ALKA-SELTZER) 325 MG TBEF tablet Take 325 mg by mouth every 6 (six) hours as needed (indigestion).     [provider]  atorvastatin (LIPITOR) 40 MG tablet Take 1 tablet (40 mg total) by mouth daily. 11/29/16   Funches, Adriana Mccallum, MD  Blood Glucose Monitoring Suppl (ACCU-CHEK AVIVA PLUS) w/Device KIT USE AS DIRECTED BY PHYSICIAN 08/05/16   Funches, Adriana Mccallum, MD  Calcium Citrate 200 MG TABS Take 2 tablets (400 mg total) by mouth daily. Patient not taking: Reported on 08/05/2016 06/24/16   Boykin Nearing, MD  Cholecalciferol (VITAMIN D3) 2000 units TABS Take 2,000 Units by mouth daily. 06/24/16   Funches, Adriana Mccallum, MD  glipiZIDE (GLUCOTROL) 10 MG tablet Take 1 tablet (10 mg total) by mouth 2 (two) times daily. 05/12/17   [name], NP  glucose blood (ACCU-CHEK AVIVA PLUS) test strip 1 each by Other route daily. E11.9 08/05/16   Funches, Adriana Mccallum, MD  Insulin Pen Needle (B-D ULTRAFINE III SHORT PEN) 31G X 8 MM MISC 1 application by Does not apply route daily. 06/24/16   Boykin Nearing, MD  Lancet Devices [facility]) lancets 1 each by Other route 3 (three) times daily. E11.9 08/05/16   Funches, Adriana Mccallum, MD  meloxicam (MOBIC) 7.5 MG tablet Take 1 tablet (7.5 mg total) by mouth daily. 07/17/17   Argentina Donovan, PA-C  Misc. Devices (ADJUSTABLE ALUMINUM CANE 7/8") MISC 1 each by Does not apply route as needed. Diagnosis code M48.54XA 05/27/14   Boykin Nearing, MD  Naproxen  Sodium (ALEVE PO) Take by mouth.    [provider]  Nutritional Supplements (GLUCERNA CARBSTEADY) LIQD Take 237 mLs by mouth daily as needed. For meal replacement. Please provide as covered by insurance. 05/12/17   [name], NP  pneumococcal 13-valent conjugate vaccine (PREVNAR 13) SUSP injection To be administered by the Pharmacist 05/12/17   Tresa Garter, MD  triamcinolone cream (KENALOG) 0.1 % Apply 1 application topically 2 (two) times daily. 05/12/17   [name], NP     Objective:  EXAM:   Vitals:   07/17/17 0845  BP: 113/68  Pulse: 72  Resp: 18  Temp: 98.6 F (37 C)  TempSrc:  Oral  SpO2: 100%  Weight: 109 lb (49.4 kg)  Height: '5\' 9"'  (1.753 m)    General appearance : A&OX3. NAD. Non-toxic-appearing HEENT: Atraumatic and Normocephalic.  PERRLA. EOM intact.   Neck: supple, no JVD. No cervical lymphadenopathy. No thyromegaly Chest/Lungs:  Breathing-non-labored, Good air entry bilaterally, breath sounds normal without rales, rhonchi, or wheezing  CVS: S1 S2 regular, no murmurs, gallops, rubs  Extremities: Bilateral Lower Ext shows no edema, both legs are warm to touch with = pulse throughout Ventral aspect of L wrist-1cm ganglion cyst.  Full S&ROM of wrist and hand.  N-V intact.  No swelling of surrounding tissue.  Minimal TTP Neurology:  CN II-XII grossly intact, Non focal.   Psych:  TP linear. J/I WNL. Normal speech. Appropriate eye contact and affect.  Skin:  No Rash  Data Review Lab Results  Component Value Date   HGBA1C 7.8 05/12/2017   HGBA1C 8.9 06/24/2016   HGBA1C 7.50 01/23/2015     Assessment & Plan   1. Ganglion cyst ACE wrap - meloxicam (MOBIC) 7.5 MG tablet; Take 1 tablet (7.5 mg total) by mouth daily.  Dispense: 30 tablet; Refill: 0 - Ambulatory referral to Hand Surgery  2. Type 2 diabetes mellitus with other specified complication, unspecified whether long term insulin use ([place]) Uncontrolled-continue current regimen-make better choices with diet(she admits to bad food choices) - Glucose (CBG)   Patient have been counseled extensively about nutrition and exercise  Return for keep 08/20/2017 appt with Geryl Rankins.  The patient was given clear instructions to go to ER or return to medical center if symptoms don't improve, worsen or new problems develop. The patient verbalized understanding. The patient was told to call to get lab results if they haven't heard anything in the next week.     Freeman Caldron, PA-C [facility] [place], [place]   07/17/2017, 8:52 AM

## 2017-07-17 NOTE — Patient Instructions (Signed)
Ganglion Cyst A ganglion cyst is a noncancerous, fluid-filled lump that occurs near joints or tendons. The ganglion cyst grows out of a joint or the lining of a tendon. It most often develops in the hand or wrist, but it can also develop in the shoulder, elbow, hip, knee, ankle, or foot. The round or oval ganglion cyst can be the size of a pea or larger than a grape. Increased activity may enlarge the size of the cyst because more fluid starts to build up. What are the causes? It is not known what causes a ganglion cyst to grow. However, it may be related to:  Inflammation or irritation around the joint.  An injury.  Repetitive movements or overuse.  Arthritis.  What increases the risk? Risk factors include:  Being a woman.  Being age 20-50.  What are the signs or symptoms? Symptoms may include:  A lump. This most often appears on the hand or wrist, but it can occur in other areas of the body.  Tingling.  Pain.  Numbness.  Muscle weakness.  Weak grip.  Less movement in a joint.  How is this diagnosed? Ganglion cysts are most often diagnosed based on a physical exam. Your health care provider will feel the lump and may shine a light alongside it. If it is a ganglion cyst, a light often shines through it. Your health care provider may order an X-ray, ultrasound, or MRI to rule out other conditions. How is this treated? Ganglion cysts usually go away on their own without treatment. If pain or other symptoms are involved, treatment may be needed. Treatment is also needed if the ganglion cyst limits your movement or if it gets infected. Treatment may include:  Wearing a brace or splint on your wrist or finger.  Taking anti-inflammatory medicine.  Draining fluid from the lump with a needle (aspiration).  Injecting a steroid into the joint.  Surgery to remove the ganglion cyst.  Follow these instructions at home:  Do not press on the ganglion cyst, poke it with a  needle, or hit it.  Take medicines only as directed by your health care provider.  Wear your brace or splint as directed by your health care provider.  Watch your ganglion cyst for any changes.  Keep all follow-up visits as directed by your health care provider. This is important. Contact a health care provider if:  Your ganglion cyst becomes larger or more painful.  You have increased redness, red streaks, or swelling.  You have pus coming from the lump.  You have weakness or numbness in the affected area.  You have a fever or chills. This information is not intended to replace advice given to you by your health care provider. Make sure you discuss any questions you have with your health care provider. Document Released: 04/05/2000 Document Revised: 09/14/2015 Document Reviewed: 09/21/2013 Elsevier Interactive Patient Education  2018 Elsevier Inc.  

## 2017-07-28 ENCOUNTER — Encounter (INDEPENDENT_AMBULATORY_CARE_PROVIDER_SITE_OTHER): Payer: Self-pay | Admitting: Orthopaedic Surgery

## 2017-07-28 ENCOUNTER — Ambulatory Visit (INDEPENDENT_AMBULATORY_CARE_PROVIDER_SITE_OTHER): Payer: Medicare Other | Admitting: Orthopaedic Surgery

## 2017-07-28 DIAGNOSIS — M25532 Pain in left wrist: Secondary | ICD-10-CM | POA: Diagnosis not present

## 2017-07-28 NOTE — Progress Notes (Signed)
pol   Office Visit Note   Patient: Ana Hines           Date of Birth: 09/25/1950           MRN: [id number] Visit Date: 07/28/2017              Requested by: Anders SimmondsMcClung, Angela M, PA-C [address] [place], [place] [id number] PCP: Claiborne RiggFleming, Zelda W, NP   Assessment & Plan: Visit Diagnoses:  1. Left wrist pain     Plan: I gave her reassurance that this is not a cyst.  I do feel that time and anti-inflammatories are going to continue to help her as well as the wrist splint as needed.  All questions concerns were answered and addressed.  She will follow-up as needed.  I did give her samples of a topical anti-inflammatory to try as well.  Follow-Up Instructions: Return if symptoms worsen or fail to improve.   Orders:  No orders of the defined types were placed in this encounter.  No orders of the defined types were placed in this encounter.     Procedures: No procedures performed   Clinical Data: No additional findings.   Subjective: Chief Complaint  Patient presents with  . Left Hand - Injury  Patient is a very pleasant 67 year old right-hand-dominant female who feels like she may have a cyst in her left wrist and she points to the volar aspect of her wrist.  It became painful about 3 weeks ago when she aggravated pushing herself up and her primary care physician reportedly on meloxicam and that is helped.  She also has a wrist splint that she wears and that is helped.  She denies any numbness and tingling today.  She said the sharp pain did go up above the elbow and into the arm when it did occur.  She was concerned that she has done some the wrong and she wanted this to get checked out before she may need surgery or something "burst".  HPI  Review of Systems She currently denies any headache, chest pain, shortness of breath, fever, chills, nausea, vomiting.  Objective: Vital Signs: There were no vitals taken for this visit.  Physical Exam She is alert and oriented  x3 and in no acute distress Ortho Exam Examination of the left wrist shows full range of motion.  I cannot palpate or visualize the cyst however she does have swelling along the palmaris longus and flexor carpi radialis tendons there is only mild.  There is no redness.  She has negative Phalen's and Tinel sign there.  Range of motion is full.  She has normal pinch and grip strength.  Her hand is well-perfused Specialty Comments:  No specialty comments available.  Imaging: No results found.   PMFS History: Patient Active Problem List   Diagnosis Date Noted  . Type 2 diabetes mellitus with other specified complication (HCC) 05/12/2017  . Skin plaque 01/23/2015  . Vitamin D deficiency 06/27/2014  . Underweight 06/24/2014  . Onychomycosis of toenail 05/27/2014  . Lung nodule 05/18/2014  . Compression fracture of T12 vertebra (HCC) 05/17/2014  . Osteoporosis 05/25/2010   Past Medical History:  Diagnosis Date  . Diabetes mellitus without complication (HCC) 20 years ago  . Hyperlipidemia   . Osteoporosis     Family History  Problem Relation Age of Onset  . Breast cancer Sister   . Ovarian cancer Cousin     Past Surgical History:  Procedure Laterality Date  . TUBAL  LIGATION     Social History   Occupational History  . Not on file  Tobacco Use  . Smoking status: Current Every Day Smoker    Packs/day: 0.50    Types: Cigarettes  . Smokeless tobacco: Never Used  Substance and Sexual Activity  . Alcohol use: Yes    Alcohol/week: 0.0 oz    Comment: 2-3 times a week  . Drug use: Yes    Types: Marijuana    Comment: Occasionally  . Sexual activity: Not on file

## 2017-07-31 ENCOUNTER — Ambulatory Visit: Payer: Medicare Other | Admitting: Nurse Practitioner

## 2017-08-01 ENCOUNTER — Ambulatory Visit: Payer: Medicare Other | Admitting: Nurse Practitioner

## 2017-08-06 ENCOUNTER — Ambulatory Visit: Payer: Medicare Other | Admitting: Nurse Practitioner

## 2017-08-18 MED FILL — glipiZIDE 10 MG TABS: 10 | 30 days supply | Qty: 60 | Fill #2

## 2017-08-18 MED FILL — ATORVASTATIN 40 MG TABLET: 40 | 30 days supply | Qty: 30 | Fill #5

## 2017-08-20 ENCOUNTER — Encounter: Payer: Self-pay | Admitting: Nurse Practitioner

## 2017-08-20 ENCOUNTER — Ambulatory Visit: Payer: Medicare Other | Attending: Nurse Practitioner | Admitting: Nurse Practitioner

## 2017-08-20 VITALS — BP 123/74 | HR 73 | Temp 98.0°F | Ht 69.0 in | Wt 107.4 lb

## 2017-08-20 DIAGNOSIS — E785 Hyperlipidemia, unspecified: Secondary | ICD-10-CM | POA: Insufficient documentation

## 2017-08-20 DIAGNOSIS — Z7982 Long term (current) use of aspirin: Secondary | ICD-10-CM | POA: Diagnosis not present

## 2017-08-20 DIAGNOSIS — E1169 Type 2 diabetes mellitus with other specified complication: Secondary | ICD-10-CM | POA: Diagnosis not present

## 2017-08-20 DIAGNOSIS — F3289 Other specified depressive episodes: Secondary | ICD-10-CM | POA: Diagnosis not present

## 2017-08-20 DIAGNOSIS — F17200 Nicotine dependence, unspecified, uncomplicated: Secondary | ICD-10-CM | POA: Diagnosis not present

## 2017-08-20 DIAGNOSIS — Z882 Allergy status to sulfonamides status: Secondary | ICD-10-CM | POA: Insufficient documentation

## 2017-08-20 DIAGNOSIS — F17210 Nicotine dependence, cigarettes, uncomplicated: Secondary | ICD-10-CM | POA: Insufficient documentation

## 2017-08-20 DIAGNOSIS — Z9851 Tubal ligation status: Secondary | ICD-10-CM | POA: Diagnosis not present

## 2017-08-20 DIAGNOSIS — Z760 Encounter for issue of repeat prescription: Secondary | ICD-10-CM | POA: Insufficient documentation

## 2017-08-20 DIAGNOSIS — Z79899 Other long term (current) drug therapy: Secondary | ICD-10-CM | POA: Diagnosis not present

## 2017-08-20 DIAGNOSIS — R64 Cachexia: Secondary | ICD-10-CM | POA: Diagnosis not present

## 2017-08-20 DIAGNOSIS — Z794 Long term (current) use of insulin: Secondary | ICD-10-CM | POA: Diagnosis not present

## 2017-08-20 DIAGNOSIS — F329 Major depressive disorder, single episode, unspecified: Secondary | ICD-10-CM | POA: Diagnosis not present

## 2017-08-20 LAB — POCT GLYCOSYLATED HEMOGLOBIN (HGB A1C): HEMOGLOBIN A1C: 9.6

## 2017-08-20 LAB — GLUCOSE, POCT (MANUAL RESULT ENTRY): POC Glucose: 177 mg/dl — AB (ref 70–99)

## 2017-08-20 MED ORDER — MIRTAZAPINE 15 MG PO TABS: 15.0000 mg | ORAL_TABLET | Freq: Every day | ORAL | 1 refills | Status: DC

## 2017-08-20 MED ORDER — GLUCOSE BLOOD VI STRP: 1.0000 | ORAL_STRIP | Freq: Every day | 11 refills | Status: DC

## 2017-08-20 MED FILL — ACCU-CHEK AVIVA PLUS TEST S: 30 days supply | Qty: 100 | Fill #0

## 2017-08-20 MED FILL — MIRTAZAPINE 15 MG TABS: 15 | 30 days supply | Qty: 30 | Fill #0

## 2017-08-20 NOTE — Patient Instructions (Signed)
Health Risks of Smoking  Smoking cigarettes is very bad for your health. Tobacco smoke has over 200 known poisons in it. It contains the poisonous gases nitrogen oxide and carbon monoxide. There are over 60 chemicals in tobacco smoke that cause cancer.  Smoking is difficult to quit because a chemical in tobacco, called nicotine, causes addiction or dependence. When you smoke and inhale, nicotine is absorbed rapidly into the bloodstream through your lungs. Both inhaled and non-inhaled nicotine may be addictive.  What are the risks of cigarette smoke?  Cigarette smokers have an increased risk of many serious medical problems, including:  · Lung cancer.  · Lung disease, such as pneumonia, bronchitis, and emphysema.  · Chest pain (angina) and heart attack because the heart is not getting enough oxygen.  · Heart disease and peripheral blood vessel disease.  · High blood pressure (hypertension).  · Stroke.  · Oral cancer, including cancer of the lip, mouth, or voice box.  · Bladder cancer.  · Pancreatic cancer.  · Cervical cancer.  · Pregnancy complications, including premature birth.  · Stillbirths and smaller newborn babies, birth defects, and genetic damage to sperm.  · Early menopause.  · Lower estrogen level for women.  · Infertility.  · Facial wrinkles.  · Blindness.  · Increased risk of broken bones (fractures).  · Senile dementia.  · Stomach ulcers and internal bleeding.  · Delayed wound healing and increased risk of complications during surgery.  · Even smoking lightly shortens your life expectancy by several years.    Because of secondhand smoke exposure, children of smokers have an increased risk of the following:  · Sudden infant death syndrome (SIDS).  · Respiratory infections.  · Lung cancer.  · Heart disease.  · Ear infections.    What are the benefits of quitting?  There are many health benefits of quitting smoking. Here are some of them:   · Within days of quitting smoking, your risk of having a heart attack decreases, your blood flow improves, and your lung capacity improves. Blood pressure, pulse rate, and breathing patterns start returning to normal soon after quitting.  · Within months, your lungs may clear up completely.  · Quitting for 10 years reduces your risk of developing lung cancer and heart disease to almost that of a nonsmoker.  · People who quit may see an improvement in their overall quality of life.    How do I quit smoking?  Smoking is an addiction with both physical and psychological effects, and longtime habits can be hard to change. Your health care provider can recommend:  · Programs and community resources, which may include group support, education, or talk therapy.  · Prescription medicines to help reduce cravings.  · Nicotine replacement products, such as patches, gum, and nasal sprays. Use these products only as directed. Do not replace cigarette smoking with electronic cigarettes, which are commonly called e-cigarettes. The safety of e-cigarettes is not known, and some may contain harmful chemicals.  · A combination of two or more of these methods.    Where to find more information:  · American Lung Association: www.lung.org  · American Cancer Society: www.cancer.org  Summary  · Smoking cigarettes is very bad for your health. Cigarette smokers have an increased risk of many serious medical problems, including several cancers, heart disease, and stroke.  · Smoking is an addiction with both physical and psychological effects, and longtime habits can be hard to change.  · By stopping right away, you   can greatly reduce the risk of medical problems for you and your family.  · To help you quit smoking, your health care provider can recommend programs, community resources, prescription medicines, and nicotine replacement products such as patches, gum, and nasal sprays.   This information is not intended to replace advice given to you by your health care provider. Make sure you discuss any questions you have with your health care provider.  Document Released: 05/16/2004 Document Revised: 04/12/2016 Document Reviewed: 04/12/2016  Elsevier Interactive Patient Education © 2017 Elsevier Inc.

## 2017-08-20 NOTE — Progress Notes (Signed)
Assessment & Plan:  Ana Hines was seen today for follow-up and medication refill.  Diagnoses and all orders for this visit:  Cachectic ([place]) -     mirtazapine (REMERON) 15 MG tablet; Take 1 tablet (15 mg total) by mouth at bedtime.  Type 2 diabetes mellitus with other specified complication, unspecified whether long term insulin use (HCC) -     Glucose (CBG) -     HgB A1c -     glucose blood (ACCU-CHEK AVIVA PLUS) test strip; 1 each by Other route daily. E11.9 Continue blood sugar control as discussed in office today, low carbohydrate diet, and regular physical exercise as tolerated, 150 minutes per week (30 min each day, 5 days per week, or 50 min 3 days per week). Keep blood sugar logs with fasting goal of 80-130 mg/dl, post prandial less than 180.  For Hypoglycemia: BS <60 and Hyperglycemia BS >400; contact the clinic ASAP. Annual eye exams and foot exams are recommended.   Other depression -     mirtazapine (REMERON) 15 MG tablet; Take 1 tablet (15 mg total) by mouth at bedtime.   Tobacco Dependence Ana Hines was counseled on the dangers of tobacco use, and was advised to quit. Reviewed strategies to maximize success, including removing cigarettes and smoking materials from environment, stress management and support of family/friends as well as pharmacological alternatives including: Wellbutrin, Chantix, Nicotine patch, Nicotine gum or lozenges. Smoking cessation support: smoking cessation hotline: 1-800-QUIT-NOW.  Smoking cessation classes are also available through [facility]. Call [phone] or visit our website at [email or web address]   A total of 3 minutes was spent on counseling for smoking cessation and Ana Hines is not ready to quit.  Patient has been counseled on age-appropriate routine health concerns for screening and prevention. These are reviewed and up-to-date. Referrals have been placed accordingly. Immunizations are up-to-date or declined.      Subjective:   Chief Complaint  Patient presents with  . Follow-up    Patient is here for a follow-up for diabetes.   . Medication Refill   HPI Ana Hines 67 y.o. female presents to office today for follow up to DM.   Diabetes Mellitus Type 2 Chronic.  Not well controlled. Her A1c has increased from 7.8 to 9.6 currently.  She notes increased stressors regarding her son who is currently living with her and is an alcoholic and has health issues.  No history of skin physical health as well as mental health today.  Her PHQ 9 score is elevated however she declines referral to long psych social worker today, resources for mental health services, or initiation of SSRI.  She denies any suicidal or homicidal ideation at this time.  She has not been checking her blood sugars at home and reports that she ran out of glucose strips.  Will refill strips today.  She denies any hypo-or hyperglycemic symptoms. Lab Results  Component Value Date   HGBA1C 9.6 08/20/2017   Depression screen PHQ 2/9 08/20/2017  Decreased Interest 3  Down, Depressed, Hopeless 3  PHQ - 2 Score 6  Altered sleeping 3  Tired, decreased energy 3  Change in appetite 3  Feeling bad or failure about yourself  3  Trouble concentrating 3  Moving slowly or fidgety/restless 3  Suicidal thoughts 3  PHQ-9 Score 27   Cachexia Endorses decreased appetite.  Weight is below guidelines for her height.  Due to increased pHQ 9 score and cachexic appearing state, will order Remeron to help  increase appetite and also provide for mood stability.   Review of Systems  Constitutional: Positive for malaise/fatigue and weight loss. Negative for fever.  HENT: Positive for tinnitus. Negative for nosebleeds.   Eyes: Negative.  Negative for blurred vision, double vision and photophobia.  Respiratory: Negative.  Negative for cough and shortness of breath.   Cardiovascular: Negative.  Negative for chest pain, palpitations and leg swelling.   Gastrointestinal: Negative.  Negative for heartburn, nausea and vomiting.  Musculoskeletal: Negative.  Negative for myalgias.  Neurological: Negative.  Negative for dizziness, focal weakness, seizures and headaches.  Psychiatric/Behavioral: Positive for depression. Negative for hallucinations, memory loss, substance abuse and suicidal ideas. The patient has insomnia. The patient is not nervous/anxious.     Past Medical History:  Diagnosis Date  . Diabetes mellitus without complication (HCC) 20 years ago  . Hyperlipidemia   . Osteoporosis     Past Surgical History:  Procedure Laterality Date  . TUBAL LIGATION      Family History  Problem Relation Age of Onset  . Breast cancer Sister   . Ovarian cancer Cousin     Social History Reviewed with no changes to be made today.   Outpatient Medications Prior to Visit  Medication Sig Dispense Refill  . ACCU-CHEK SOFTCLIX LANCETS lancets 1 each by Other route 3 (three) times daily. E11.9 100 each 11  . aspirin EC 81 MG tablet Take 1 tablet (81 mg total) by mouth daily. 30 tablet 11  . atorvastatin (LIPITOR) 40 MG tablet Take 1 tablet (40 mg total) by mouth daily. 90 tablet 3  . Blood Glucose Monitoring Suppl (ACCU-CHEK AVIVA PLUS) w/Device KIT USE AS DIRECTED BY PHYSICIAN 1 kit 0  . glipiZIDE (GLUCOTROL) 10 MG tablet Take 1 tablet (10 mg total) by mouth 2 (two) times daily. 60 tablet 5  . Lancet Devices (ACCU-CHEK SOFTCLIX) lancets 1 each by Other route 3 (three) times daily. E11.9 1 each 0  . meloxicam (MOBIC) 7.5 MG tablet Take 1 tablet (7.5 mg total) by mouth daily. 30 tablet 0  . glucose blood (ACCU-CHEK AVIVA PLUS) test strip 1 each by Other route daily. E11.9 100 each 11  . alendronate (FOSAMAX) 70 MG tablet Take 1 tablet (70 mg total) by mouth once a week. Take with a full glass of water on an empty stomach. (Patient not taking: Reported on 08/20/2017) 12 tablet 3  . ammonium lactate (LAC-HYDRIN) 12 % cream Apply topically as needed  for dry skin. (Patient not taking: Reported on 08/20/2017) 385 g 11  . aspirin-sod bicarb-citric acid (ALKA-SELTZER) 325 MG TBEF tablet Take 325 mg by mouth every 6 (six) hours as needed (indigestion).    . Calcium Citrate 200 MG TABS Take 2 tablets (400 mg total) by mouth daily. (Patient not taking: Reported on 08/05/2016) 60 tablet 11  . Cholecalciferol (VITAMIN D3) 2000 units TABS Take 2,000 Units by mouth daily. (Patient not taking: Reported on 08/20/2017) 30 tablet 11  . Insulin Pen Needle (B-D ULTRAFINE III SHORT PEN) 31G X 8 MM MISC 1 application by Does not apply route daily. (Patient not taking: Reported on 08/20/2017) 90 each 3  . Misc. Devices (ADJUSTABLE ALUMINUM CANE 7/8") MISC 1 each by Does not apply route as needed. Diagnosis code M48.54XA (Patient not taking: Reported on 08/20/2017) 1 each 0  . Naproxen Sodium (ALEVE PO) Take by mouth.    . pneumococcal 13-valent conjugate vaccine (PREVNAR 13) SUSP injection To be administered by the Pharmacist (Patient not taking: Reported on  08/20/2017) 0.5 mL 0  . triamcinolone cream (KENALOG) 0.1 % Apply 1 application topically 2 (two) times daily. (Patient not taking: Reported on 08/20/2017) 30 g 1  . Nutritional Supplements (GLUCERNA CARBSTEADY) LIQD Take 237 mLs by mouth daily as needed. For meal replacement. Please provide as covered by insurance. (Patient not taking: Reported on 08/20/2017) 237 mL 12   No facility-administered medications prior to visit.     Allergies  Allergen Reactions  . Sulfa Antibiotics Other (See Comments)    hives  . Sulfonamide Derivatives        Objective:    BP 123/74 (BP Location: Left Arm, Patient Position: Sitting, Cuff Size: Normal)   Pulse 73   Temp 98 F (36.7 C) (Oral)   Ht 5' 9" (1.753 m)   Wt 107 lb 6.4 oz (48.7 kg)   SpO2 97%   BMI 15.86 kg/m  Wt Readings from Last 3 Encounters:  08/20/17 107 lb 6.4 oz (48.7 kg)  07/17/17 109 lb (49.4 kg)  05/12/17 112 lb 9.6 oz (51.1 kg)    Physical Exam    Constitutional: She is oriented to person, place, and time. She appears cachectic. She is cooperative.  Non-toxic appearance. She does not have a sickly appearance. She does not appear ill. No distress.  HENT:  Head: Normocephalic and atraumatic.  Right Ear: Tympanic membrane is scarred. A middle ear effusion is present.  Left Ear: Tympanic membrane is scarred.  Eyes: EOM are normal.  Neck: Normal range of motion.  Cardiovascular: Normal rate, regular rhythm and normal heart sounds. Exam reveals no gallop and no friction rub.  No murmur heard. Pulmonary/Chest: Effort normal and breath sounds normal. No tachypnea. No respiratory distress. She has no decreased breath sounds. She has no wheezes. She has no rhonchi. She has no rales. She exhibits no tenderness.  Abdominal: Soft. Bowel sounds are normal.  Musculoskeletal: Normal range of motion. She exhibits no edema.  Neurological: She is alert and oriented to person, place, and time. Coordination normal.  Skin: Skin is warm and dry. She is not diaphoretic.  Psychiatric: She has a normal mood and affect. Her speech is normal and behavior is normal. Judgment and thought content normal. Cognition and memory are normal.  Nursing note and vitals reviewed.        Patient has been counseled extensively about nutrition and exercise as well as the importance of adherence with medications and regular follow-up. The patient was given clear instructions to go to ER or return to medical center if symptoms don't improve, worsen or new problems develop. The patient verbalized understanding.   Follow-up: Return in about 1 month (around 09/17/2017) for appetite/depression.   [name], FNP-BC [facility] and [place], [place]   08/20/2017, 6:03 PM

## 2017-08-22 ENCOUNTER — Telehealth: Payer: Self-pay

## 2017-08-22 NOTE — Telephone Encounter (Signed)
error 

## 2017-09-17 ENCOUNTER — Encounter: Payer: Self-pay | Admitting: Podiatry

## 2017-09-17 ENCOUNTER — Ambulatory Visit (INDEPENDENT_AMBULATORY_CARE_PROVIDER_SITE_OTHER): Payer: Medicare Other | Admitting: Podiatry

## 2017-09-17 DIAGNOSIS — M79674 Pain in right toe(s): Secondary | ICD-10-CM

## 2017-09-17 DIAGNOSIS — M79675 Pain in left toe(s): Secondary | ICD-10-CM | POA: Diagnosis not present

## 2017-09-17 DIAGNOSIS — E119 Type 2 diabetes mellitus without complications: Secondary | ICD-10-CM

## 2017-09-17 DIAGNOSIS — B351 Tinea unguium: Secondary | ICD-10-CM

## 2017-09-17 NOTE — Progress Notes (Signed)
Complaint:  Visit Type: Patient returns to my office for continued preventative foot care services. Complaint: Patient states" my nails have grown long and thick and become painful to walk and wear shoes" Patient has been diagnosed with DM with no foot complications. The patient presents for preventative foot care services. No changes to ROS  Podiatric Exam: Vascular: dorsalis pedis and posterior tibial pulses are palpable bilateral. Capillary return is immediate. Temperature gradient is WNL. Skin turgor WNL  Sensorium: Normal Semmes Weinstein monofilament test. Normal tactile sensation bilaterally. Nail Exam: Pt has thick disfigured discolored nails with subungual debris noted bilateral entire nail hallux through fifth toenails Ulcer Exam: There is no evidence of ulcer or pre-ulcerative changes or infection. Orthopedic Exam: Muscle tone and strength are WNL. No limitations in general ROM. No crepitus or effusions noted. HAV  B/L Skin: No Porokeratosis. No infection or ulcers  Diagnosis:  Onychomycosis, , Pain in right toe, pain in left toes  Treatment & Plan Procedures and Treatment: Consent by patient was obtained for treatment procedures.   Debridement of mycotic and hypertrophic toenails, 1 through 5 bilateral and clearing of subungual debris. No ulceration, no infection noted.  Return Visit-Office Procedure: Patient instructed to return to the office for a follow up visit 3 months for continued evaluation and treatment.    Helane Gunther DPM

## 2017-10-01 ENCOUNTER — Ambulatory Visit: Payer: Medicare Other | Attending: Nurse Practitioner | Admitting: Nurse Practitioner

## 2017-10-01 ENCOUNTER — Encounter: Payer: Self-pay | Admitting: Nurse Practitioner

## 2017-10-01 VITALS — BP 138/79 | HR 82 | Temp 98.0°F | Ht 69.0 in | Wt 106.4 lb

## 2017-10-01 DIAGNOSIS — M810 Age-related osteoporosis without current pathological fracture: Secondary | ICD-10-CM

## 2017-10-01 DIAGNOSIS — Z803 Family history of malignant neoplasm of breast: Secondary | ICD-10-CM | POA: Insufficient documentation

## 2017-10-01 DIAGNOSIS — E785 Hyperlipidemia, unspecified: Secondary | ICD-10-CM | POA: Diagnosis not present

## 2017-10-01 DIAGNOSIS — Z794 Long term (current) use of insulin: Secondary | ICD-10-CM | POA: Diagnosis not present

## 2017-10-01 DIAGNOSIS — Z791 Long term (current) use of non-steroidal anti-inflammatories (NSAID): Secondary | ICD-10-CM | POA: Diagnosis not present

## 2017-10-01 DIAGNOSIS — Z09 Encounter for follow-up examination after completed treatment for conditions other than malignant neoplasm: Secondary | ICD-10-CM | POA: Diagnosis not present

## 2017-10-01 DIAGNOSIS — E1169 Type 2 diabetes mellitus with other specified complication: Secondary | ICD-10-CM

## 2017-10-01 DIAGNOSIS — Z8041 Family history of malignant neoplasm of ovary: Secondary | ICD-10-CM | POA: Insufficient documentation

## 2017-10-01 DIAGNOSIS — R64 Cachexia: Secondary | ICD-10-CM | POA: Insufficient documentation

## 2017-10-01 DIAGNOSIS — R040 Epistaxis: Secondary | ICD-10-CM | POA: Diagnosis not present

## 2017-10-01 DIAGNOSIS — Z9889 Other specified postprocedural states: Secondary | ICD-10-CM | POA: Insufficient documentation

## 2017-10-01 DIAGNOSIS — Z882 Allergy status to sulfonamides status: Secondary | ICD-10-CM | POA: Insufficient documentation

## 2017-10-01 DIAGNOSIS — E119 Type 2 diabetes mellitus without complications: Secondary | ICD-10-CM | POA: Insufficient documentation

## 2017-10-01 DIAGNOSIS — Z7982 Long term (current) use of aspirin: Secondary | ICD-10-CM | POA: Insufficient documentation

## 2017-10-01 DIAGNOSIS — Z79899 Other long term (current) drug therapy: Secondary | ICD-10-CM | POA: Diagnosis not present

## 2017-10-01 LAB — GLUCOSE, POCT (MANUAL RESULT ENTRY): POC Glucose: 237 mg/dl — AB (ref 70–99)

## 2017-10-01 MED ORDER — ALENDRONATE SODIUM 70 MG PO TABS
70.0000 mg | ORAL_TABLET | ORAL | 3 refills | Status: DC
Start: 2017-10-01 — End: 2018-06-05

## 2017-10-01 MED FILL — ALENDRONATE NA 70 MG TAB: 70 | 28 days supply | Qty: 4 | Fill #0

## 2017-10-01 NOTE — Progress Notes (Signed)
Assessment & Plan:  Ana Hines was seen today for follow-up.  Diagnoses and all orders for this visit:  Cachectic ([place]) -     TSH -     CBC  Type 2 diabetes mellitus with other specified complication, unspecified whether long term insulin use (HCC) -     Glucose (CBG) -     Ambulatory referral to Podiatry  Age-related osteoporosis without current pathological fracture -     alendronate (FOSAMAX) 70 MG tablet; Take 1 tablet (70 mg total) by mouth once a week. Take with a full glass of water on an empty stomach.    Patient has been counseled on age-appropriate routine health concerns for screening and prevention. These are reviewed and up-to-date. Referrals have been placed accordingly. Immunizations are up-to-date or declined.    Subjective:   Chief Complaint  Patient presents with  . Follow-up    Pt. is here follow-up. Pt. stated when she take Remeron, i helps her sleep.    HPI Ana Hines 67 y.o. female presents to office today for cachexia. Her weight has not improved despite placing her on remeron for appetite stimulation. Will order TSH.  She has an appointment for Colonoscopy 11-24-2017 with [facility].   Acute Epistaxis Patient presents with a nosebleed from bilateral nostrils this morning.  It is associated with no specific cause and has currently resolved. Will obtain CBC. She reports blowing her nose and seeing a large clot come out of her nose. She has not experienced any additional occurrences.    Review of Systems  Constitutional: Positive for weight loss. Negative for fever and malaise/fatigue.  HENT: Positive for nosebleeds.   Eyes: Negative.  Negative for blurred vision, double vision and photophobia.  Respiratory: Negative.  Negative for cough and shortness of breath.   Cardiovascular: Negative.  Negative for chest pain, palpitations and leg swelling.  Gastrointestinal: Negative.  Negative for heartburn, nausea and vomiting.  Musculoskeletal: Negative.   Negative for myalgias.  Neurological: Negative.  Negative for dizziness, focal weakness, seizures and headaches.  Psychiatric/Behavioral: Negative.  Negative for suicidal ideas.    Past Medical History:  Diagnosis Date  . Diabetes mellitus without complication (HCC) 20 years ago  . Hyperlipidemia   . Osteoporosis     Past Surgical History:  Procedure Laterality Date  . TUBAL LIGATION      Family History  Problem Relation Age of Onset  . Breast cancer Sister   . Ovarian cancer Cousin     Social History Reviewed with no changes to be made today.   Outpatient Medications Prior to Visit  Medication Sig Dispense Refill  . ACCU-CHEK SOFTCLIX LANCETS lancets 1 each by Other route 3 (three) times daily. E11.9 100 each 11  . aspirin EC 81 MG tablet Take 1 tablet (81 mg total) by mouth daily. 30 tablet 11  . atorvastatin (LIPITOR) 40 MG tablet Take 1 tablet (40 mg total) by mouth daily. 90 tablet 3  . Blood Glucose Monitoring Suppl (ACCU-CHEK AVIVA PLUS) w/Device KIT USE AS DIRECTED BY PHYSICIAN 1 kit 0  . glipiZIDE (GLUCOTROL) 10 MG tablet Take 1 tablet (10 mg total) by mouth 2 (two) times daily. 60 tablet 5  . glucose blood (ACCU-CHEK AVIVA PLUS) test strip 1 each by Other route daily. E11.9 100 each 11  . Lancet Devices (ACCU-CHEK SOFTCLIX) lancets 1 each by Other route 3 (three) times daily. E11.9 1 each 0  . Naproxen Sodium (ALEVE PO) Take by mouth.    Marland Kitchen  ammonium lactate (LAC-HYDRIN) 12 % cream Apply topically as needed for dry skin. (Patient not taking: Reported on 08/20/2017) 385 g 11  . aspirin-sod bicarb-citric acid (ALKA-SELTZER) 325 MG TBEF tablet Take 325 mg by mouth every 6 (six) hours as needed (indigestion).    . Calcium Citrate 200 MG TABS Take 2 tablets (400 mg total) by mouth daily. (Patient not taking: Reported on 08/05/2016) 60 tablet 11  . Cholecalciferol (VITAMIN D3) 2000 units TABS Take 2,000 Units by mouth daily. (Patient not taking: Reported on 08/20/2017) 30 tablet 11   . Insulin Pen Needle (B-D ULTRAFINE III SHORT PEN) 31G X 8 MM MISC 1 application by Does not apply route daily. (Patient not taking: Reported on 08/20/2017) 90 each 3  . meloxicam (MOBIC) 7.5 MG tablet Take 1 tablet (7.5 mg total) by mouth daily. (Patient not taking: Reported on 10/01/2017) 30 tablet 0  . mirtazapine (REMERON) 15 MG tablet Take 1 tablet (15 mg total) by mouth at bedtime. 30 tablet 1  . Misc. Devices (ADJUSTABLE ALUMINUM CANE 7/8") MISC 1 each by Does not apply route as needed. Diagnosis code M48.54XA (Patient not taking: Reported on 08/20/2017) 1 each 0  . pneumococcal 13-valent conjugate vaccine (PREVNAR 13) SUSP injection To be administered by the Pharmacist (Patient not taking: Reported on 08/20/2017) 0.5 mL 0  . alendronate (FOSAMAX) 70 MG tablet Take 1 tablet (70 mg total) by mouth once a week. Take with a full glass of water on an empty stomach. (Patient not taking: Reported on 08/20/2017) 12 tablet 3  . triamcinolone cream (KENALOG) 0.1 % Apply 1 application topically 2 (two) times daily. (Patient not taking: Reported on 08/20/2017) 30 g 1   No facility-administered medications prior to visit.     Allergies  Allergen Reactions  . Sulfa Antibiotics Other (See Comments)    hives  . Sulfonamide Derivatives        Objective:    BP 138/79 (BP Location: Left Arm, Patient Position: Sitting, Cuff Size: Normal)   Pulse 82   Temp 98 F (36.7 C) (Oral)   Ht '5\' 9"'  (1.753 m)   Wt 106 lb 6.4 oz (48.3 kg)   SpO2 100%   BMI 15.71 kg/m  Wt Readings from Last 3 Encounters:  10/01/17 106 lb 6.4 oz (48.3 kg)  08/20/17 107 lb 6.4 oz (48.7 kg)  07/17/17 109 lb (49.4 kg)    Physical Exam  Constitutional: She is oriented to person, place, and time. She appears well-developed and well-nourished. She is cooperative.  HENT:  Head: Normocephalic and atraumatic.  Eyes: EOM are normal.  Neck: Normal range of motion.  Cardiovascular: Normal rate, regular rhythm and normal heart sounds. Exam  reveals no gallop and no friction rub.  No murmur heard. Pulmonary/Chest: Effort normal and breath sounds normal. No tachypnea. No respiratory distress. She has no decreased breath sounds. She has no wheezes. She has no rhonchi. She has no rales. She exhibits no tenderness.  Abdominal: Soft. Bowel sounds are normal.  Musculoskeletal: Normal range of motion. She exhibits no edema.  Neurological: She is alert and oriented to person, place, and time. Coordination normal.  Skin: Skin is warm and dry.  Psychiatric: She has a normal mood and affect. Her behavior is normal. Judgment and thought content normal.  Nursing note and vitals reviewed.     Patient has been counseled extensively about nutrition and exercise as well as the importance of adherence with medications and regular follow-up. The patient was given clear instructions  to go to ER or return to medical center if symptoms don't improve, worsen or new problems develop. The patient verbalized understanding.   Follow-up: Return in about 2 months (around 12/01/2017) for dm.   [name], FNP-BC [facility] and [facility], [place]   10/01/2017, 12:27 PM

## 2017-10-01 NOTE — Patient Instructions (Signed)
Osteoporosis Osteoporosis is the thinning and loss of density in the bones. Osteoporosis makes the bones more brittle, fragile, and likely to break (fracture). Over time, osteoporosis can cause the bones to become so weak that they fracture after a simple fall. The bones most likely to fracture are the bones in the hip, wrist, and spine. What are the causes? The exact cause is not known. What increases the risk? Anyone can develop osteoporosis. You may be at greater risk if you have a family history of the condition or have poor nutrition. You may also have a higher risk if you are:  Female.  50 years old or older.  A smoker.  Not physically active.  White or Asian.  Slender.  What are the signs or symptoms? A fracture might be the first sign of the disease, especially if it results from a fall or injury that would not usually cause a bone to break. Other signs and symptoms include:  Low back and neck pain.  Stooped posture.  Height loss.  How is this diagnosed? To make a diagnosis, your health care provider may:  Take a medical history.  Perform a physical exam.  Order tests, such as: ? A bone mineral density test. ? A dual-energy X-ray absorptiometry test.  How is this treated? The goal of osteoporosis treatment is to strengthen your bones to reduce your risk of a fracture. Treatment may involve:  Making lifestyle changes, such as: ? Eating a diet rich in calcium. ? Doing weight-bearing and muscle-strengthening exercises. ? Stopping tobacco use. ? Limiting alcohol intake.  Taking medicine to slow the process of bone loss or to increase bone density.  Monitoring your levels of calcium and vitamin D.  Follow these instructions at home:  Include calcium and vitamin D in your diet. Calcium is important for bone health, and vitamin D helps the body absorb calcium.  Perform weight-bearing and muscle-strengthening exercises as directed by your health care  provider.  Do not use any tobacco products, including cigarettes, chewing tobacco, and electronic cigarettes. If you need help quitting, ask your health care provider.  Limit your alcohol intake.  Take medicines only as directed by your health care provider.  Keep all follow-up visits as directed by your health care provider. This is important.  Take precautions at home to lower your risk of falling, such as: ? Keeping rooms well lit and clutter free. ? Installing safety rails on stairs. ? Using rubber mats in the bathroom and other areas that are often wet or slippery. Get help right away if: You fall or injure yourself. This information is not intended to replace advice given to you by your health care provider. Make sure you discuss any questions you have with your health care provider. Document Released: 01/16/2005 Document Revised: 09/11/2015 Document Reviewed: 09/16/2013 Elsevier Interactive Patient Education  2018 [employer]  

## 2017-10-02 LAB — CBC
Hematocrit: 36.3 % (ref 34.0–46.6)
Hemoglobin: 11.9 g/dL (ref 11.1–15.9)
MCH: 32.1 pg (ref 26.6–33.0)
MCHC: 32.8 g/dL (ref 31.5–35.7)
MCV: 98 fL — AB (ref 79–97)
Platelets: 350 10*3/uL (ref 150–450)
RBC: 3.71 x10E6/uL — ABNORMAL LOW (ref 3.77–5.28)
RDW: 13.2 % (ref 12.3–15.4)
WBC: 13.6 10*3/uL — AB (ref 3.4–10.8)

## 2017-10-02 LAB — TSH: TSH: 0.358 u[IU]/mL — ABNORMAL LOW (ref 0.450–4.500)

## 2017-10-04 ENCOUNTER — Emergency Department (HOSPITAL_COMMUNITY): Payer: Medicare Other

## 2017-10-04 ENCOUNTER — Emergency Department (HOSPITAL_COMMUNITY)
Admission: EM | Admit: 2017-10-04 | Discharge: 2017-10-04 | Disposition: A | Discharge status: Home / Self Care | Payer: Medicare Other | Attending: Emergency Medicine | Admitting: Emergency Medicine

## 2017-10-04 ENCOUNTER — Other Ambulatory Visit: Payer: Self-pay

## 2017-10-04 DIAGNOSIS — Z7982 Long term (current) use of aspirin: Secondary | ICD-10-CM | POA: Diagnosis not present

## 2017-10-04 DIAGNOSIS — M25552 Pain in left hip: Secondary | ICD-10-CM | POA: Insufficient documentation

## 2017-10-04 DIAGNOSIS — W01190A Fall on same level from slipping, tripping and stumbling with subsequent striking against furniture, initial encounter: Secondary | ICD-10-CM | POA: Diagnosis not present

## 2017-10-04 DIAGNOSIS — Z79899 Other long term (current) drug therapy: Secondary | ICD-10-CM | POA: Diagnosis not present

## 2017-10-04 DIAGNOSIS — Y92018 Other place in single-family (private) house as the place of occurrence of the external cause: Secondary | ICD-10-CM | POA: Diagnosis not present

## 2017-10-04 DIAGNOSIS — Y9301 Activity, walking, marching and hiking: Secondary | ICD-10-CM | POA: Diagnosis not present

## 2017-10-04 DIAGNOSIS — Z7984 Long term (current) use of oral hypoglycemic drugs: Secondary | ICD-10-CM | POA: Insufficient documentation

## 2017-10-04 DIAGNOSIS — M545 Low back pain: Secondary | ICD-10-CM | POA: Diagnosis not present

## 2017-10-04 DIAGNOSIS — Y999 Unspecified external cause status: Secondary | ICD-10-CM | POA: Insufficient documentation

## 2017-10-04 DIAGNOSIS — F17210 Nicotine dependence, cigarettes, uncomplicated: Secondary | ICD-10-CM | POA: Insufficient documentation

## 2017-10-04 DIAGNOSIS — W19XXXA Unspecified fall, initial encounter: Secondary | ICD-10-CM

## 2017-10-04 DIAGNOSIS — E119 Type 2 diabetes mellitus without complications: Secondary | ICD-10-CM | POA: Diagnosis not present

## 2017-10-04 LAB — CBG MONITORING, ED: Glucose-Capillary: 313 mg/dL — ABNORMAL HIGH (ref 65–99)

## 2017-10-04 MED ORDER — ACETAMINOPHEN 325 MG PO TABS
650.0000 mg | ORAL_TABLET | Freq: Once | ORAL | Status: AC
  Administered 2017-10-04: 650 mg via ORAL
  Filled 2017-10-04: qty 2

## 2017-10-04 MED ORDER — TRAMADOL HCL 50 MG PO TABS: 50.0000 mg | ORAL_TABLET | Freq: Four times a day (QID) | ORAL | 0 refills | Status: DC | PRN

## 2017-10-04 MED ORDER — ONDANSETRON HCL 4 MG/2ML IJ SOLN
4.0000 mg | Freq: Once | INTRAMUSCULAR | Status: DC
  Filled 2017-10-04: qty 2

## 2017-10-04 MED ORDER — TRAMADOL HCL 50 MG PO TABS
50.0000 mg | ORAL_TABLET | Freq: Once | ORAL | Status: AC
  Administered 2017-10-04: 50 mg via ORAL
  Filled 2017-10-04: qty 1

## 2017-10-04 MED ORDER — ONDANSETRON HCL 4 MG PO TABS
4.0000 mg | ORAL_TABLET | Freq: Once | ORAL | Status: AC
  Administered 2017-10-04: 4 mg via ORAL
  Filled 2017-10-04: qty 1

## 2017-10-04 MED ORDER — ACETAMINOPHEN 325 MG PO TABS
325.0000 mg | ORAL_TABLET | Freq: Once | ORAL | Status: AC
  Administered 2017-10-04: 325 mg via ORAL
  Filled 2017-10-04: qty 1

## 2017-10-04 MED ORDER — IBUPROFEN 200 MG PO TABS
600.0000 mg | ORAL_TABLET | Freq: Once | ORAL | Status: AC
  Administered 2017-10-04: 600 mg via ORAL
  Filled 2017-10-04: qty 3

## 2017-10-04 NOTE — ED Notes (Signed)
Patient able to ambulate on her room with assistance.

## 2017-10-04 NOTE — ED Notes (Signed)
Patient refused to ambulate at this time. Patientt pain 8/10 on her left hip.

## 2017-10-04 NOTE — ED Notes (Signed)
Patient transported to X-ray 

## 2017-10-04 NOTE — Discharge Instructions (Signed)
Please practice hip range of motion exercises to prevent worsening stiffness and pain.

## 2017-10-04 NOTE — ED Triage Notes (Signed)
Pt from home via EMS c/o fall w/ L hip pain. Pt reports that she fell onto a corner of a table hitting her L hip. Pt denies LOC. Pt is A&O and in NAD

## 2017-10-04 NOTE — ED Notes (Signed)
Pt told of order to ambulate, pt reports being in too much pain to walk.

## 2017-10-04 NOTE — ED Provider Notes (Signed)
[facility] DEPT Provider Note   CSN: [id number] Arrival date & time: 10/04/17  1507     History   Chief Complaint Chief Complaint  Patient presents with  . Fall  . Hip Pain    HPI Ana Hines is a 67 y.o. female with a past medical history of diabetes, hyperlipidemia, osteoporosis, who presents to ED for evaluation of left hip pain and left-sided back pain after fall that occurred prior to arrival.  She states that she lost her footing while walking in her living room when she fell, hit the left side of her body on the edge of the loveseat and fell to the floor.  She denies any loss of consciousness.  She was able to stand up, walk to the chair and sit down.  She tried laying down and taking Aleve but her pain worsened.  She had a history of "broken back" approximately 2 years ago for which she needed a brace and physical therapy for during another accident.  She denies any vomiting, nausea, headache, vision changes, numbness in legs, loss of bowel or bladder function, prior back surgeries, syncope.   HPI  Past Medical History:  Diagnosis Date  . Diabetes mellitus without complication (HCC) 20 years ago  . Hyperlipidemia   . Osteoporosis     Patient Active Problem List   Diagnosis Date Noted  . Type 2 diabetes mellitus with other specified complication ([place]) [id number]/[id number]  . Skin plaque 01/23/2015  . Vitamin D deficiency 06/27/2014  . Underweight 06/24/2014  . Onychomycosis of toenail 05/27/2014  . Lung nodule 05/18/2014  . Compression fracture of T12 vertebra ([place]) 05/17/2014  . Osteoporosis 05/25/2010    Past Surgical History:  Procedure Laterality Date  . TUBAL LIGATION       OB History   None      Home Medications    Prior to Admission medications   Medication Sig Start Date End Date Taking? Authorizing Provider  ACCU-CHEK SOFTCLIX LANCETS lancets 1 each by Other route 3 (three) times daily. E11.9 08/05/16   Funches,  Adriana Mccallum, MD  alendronate (FOSAMAX) 70 MG tablet Take 1 tablet (70 mg total) by mouth once a week. Take with a full glass of water on an empty stomach. 10/01/17   [name], NP  ammonium lactate (LAC-HYDRIN) 12 % cream Apply topically as needed for dry skin. Patient not taking: Reported on 08/20/2017 05/12/17   [name], NP  aspirin EC 81 MG tablet Take 1 tablet (81 mg total) by mouth daily. 06/24/16   Funches, Adriana Mccallum, MD  aspirin-sod bicarb-citric acid (ALKA-SELTZER) 325 MG TBEF tablet Take 325 mg by mouth every 6 (six) hours as needed (indigestion).    [provider]  atorvastatin (LIPITOR) 40 MG tablet Take 1 tablet (40 mg total) by mouth daily. 11/29/16   Funches, Adriana Mccallum, MD  Blood Glucose Monitoring Suppl (ACCU-CHEK AVIVA PLUS) w/Device KIT USE AS DIRECTED BY PHYSICIAN 08/05/16   Funches, Adriana Mccallum, MD  Calcium Citrate 200 MG TABS Take 2 tablets (400 mg total) by mouth daily. Patient not taking: Reported on 08/05/2016 06/24/16   Boykin Nearing, MD  Cholecalciferol (VITAMIN D3) 2000 units TABS Take 2,000 Units by mouth daily. Patient not taking: Reported on 08/20/2017 06/24/16   Boykin Nearing, MD  glipiZIDE (GLUCOTROL) 10 MG tablet Take 1 tablet (10 mg total) by mouth 2 (two) times daily. 05/12/17   [name], NP  glucose blood (ACCU-CHEK AVIVA PLUS) test strip 1 each by  Other route daily. E11.9 08/20/17   [name], NP  Insulin Pen Needle (B-D ULTRAFINE III SHORT PEN) 31G X 8 MM MISC 1 application by Does not apply route daily. Patient not taking: Reported on 08/20/2017 06/24/16   Boykin Nearing, MD  Lancet Devices [facility]) lancets 1 each by Other route 3 (three) times daily. E11.9 08/05/16   Funches, Adriana Mccallum, MD  meloxicam (MOBIC) 7.5 MG tablet Take 1 tablet (7.5 mg total) by mouth daily. Patient not taking: Reported on 10/01/2017 07/17/17   Argentina Donovan, PA-C  mirtazapine (REMERON) 15 MG tablet Take 1 tablet (15 mg total) by mouth at bedtime. 08/20/17  09/19/17  [name], NP  Misc. Devices (ADJUSTABLE ALUMINUM CANE 7/8") MISC 1 each by Does not apply route as needed. Diagnosis code M48.54XA Patient not taking: Reported on 08/20/2017 05/27/14   Boykin Nearing, MD  Naproxen Sodium (ALEVE PO) Take by mouth.    [provider]  pneumococcal 13-valent conjugate vaccine (PREVNAR 13) SUSP injection To be administered by the Pharmacist Patient not taking: Reported on 08/20/2017 05/12/17   Tresa Garter, MD  traMADol (ULTRAM) 50 MG tablet Take 1 tablet (50 mg total) by mouth every 6 (six) hours as needed. 10/04/17   Delia Heady, PA-C    Family History Family History  Problem Relation Age of Onset  . Breast cancer Sister   . Ovarian cancer Cousin     Social History Social History   Tobacco Use  . Smoking status: Current Every Day Smoker    Packs/day: 0.50    Types: Cigarettes  . Smokeless tobacco: Never Used  Substance Use Topics  . Alcohol use: Yes    Alcohol/week: 0.0 oz    Comment: 2-3 times a week  . Drug use: Yes    Types: Marijuana    Comment: Occasionally     Allergies   Sulfa antibiotics and Sulfonamide derivatives   Review of Systems Review of Systems  Constitutional: Negative for appetite change, chills and fever.  HENT: Negative for ear pain, rhinorrhea, sneezing and sore throat.   Eyes: Negative for photophobia and visual disturbance.  Respiratory: Negative for cough, chest tightness, shortness of breath and wheezing.   Cardiovascular: Negative for chest pain and palpitations.  Gastrointestinal: Negative for abdominal pain, blood in stool, constipation, diarrhea, nausea and vomiting.  Genitourinary: Negative for dysuria, hematuria and urgency.  Musculoskeletal: Positive for arthralgias, back pain and myalgias. Negative for neck pain and neck stiffness.  Skin: Negative for rash.  Neurological: Negative for dizziness, weakness and light-headedness.     Physical Exam Updated Vital Signs BP  135/83   Pulse 81   Temp 98.5 F (36.9 C) (Oral)   Resp 16   Ht '5\' 9"'  (1.753 m)   Wt 48.1 kg (106 lb)   SpO2 99%   BMI 15.65 kg/m   Physical Exam  Constitutional: She is oriented to person, place, and time. She appears well-developed and well-nourished. No distress.  HENT:  Head: Normocephalic and atraumatic.  Nose: Nose normal.  Eyes: Pupils are equal, round, and reactive to light. Conjunctivae and EOM are normal. Right eye exhibits no discharge. Left eye exhibits no discharge. No scleral icterus.  Neck: Normal range of motion. Neck supple.  Cardiovascular: Normal rate, regular rhythm, normal heart sounds and intact distal pulses. Exam reveals no gallop and no friction rub.  No murmur heard. Pulmonary/Chest: Effort normal and breath sounds normal. No respiratory distress.  Abdominal: Soft. Bowel sounds are normal. She  exhibits no distension. There is no tenderness. There is no guarding.  Musculoskeletal: Normal range of motion. She exhibits tenderness. She exhibits no edema.       Back:  Tenderness to palpation of the left posterior hip.  Pain with range of motion.  No deformity or rotation noted. No midline spinal tenderness present in lumbar, thoracic or cervical spine. No step-off palpated. No visible bruising, edema or temperature change noted. No objective signs of numbness present. No saddle anesthesia. 2+ DP pulses bilaterally. Sensation intact to light touch. Strength 5/5 in bilateral lower extremities.   Neurological: She is alert and oriented to person, place, and time. No cranial nerve deficit or sensory deficit. She exhibits normal muscle tone. Coordination normal.  Pupils reactive. No facial asymmetry noted. Cranial nerves appear grossly intact. Sensation intact to light touch on face.   Skin: Skin is warm and dry. No rash noted.  Psychiatric: She has a normal mood and affect.  Nursing note and vitals reviewed.    ED Treatments / Results  Labs (all labs ordered are  listed, but only abnormal results are displayed) Labs Reviewed  CBG MONITORING, ED - Abnormal; Notable for the following components:      Result Value   Glucose-Capillary 313 (*)    All other components within normal limits    EKG None  Radiology Dg Lumbar Spine Complete  Result Date: 10/04/2017 CLINICAL DATA:  LEFT hip pain.  Fall EXAM: LUMBAR SPINE - COMPLETE 4+ VIEW COMPARISON:  And the FINDINGS: Anterior wedge compression fracture at T12 is similar to fracture on CT 05/17/2014. No acute loss vertebral height or disc height. No subluxation. No pars fracture. Atherosclerotic calcification of the aorta. IMPRESSION: 1. Remote compression fracture at T12. 2. No evidence of acute lumbar spine injury. Electronically Signed   By: Suzy Bouchard M.D.   On: 10/04/2017 16:47   Ct Hip Left Wo Contrast  Result Date: 10/04/2017 CLINICAL DATA:  Left hip pain after fall. Patient fell onto a corner of a table hitting the left hip. EXAM: CT OF THE LEFT HIP WITHOUT CONTRAST TECHNIQUE: Multidetector CT imaging of the left hip was performed according to the standard protocol. Multiplanar CT image reconstructions were also generated. COMPARISON:  Same day pelvis and left hip radiographs. FINDINGS: Bones/Joint/Cartilage No acute fracture or malalignment of the left hip. No suspicious osseous lesions. There is an 8 mm subchondral cyst of the left posterior acetabular wall that likely reflects stigmata of a full-thickness acetabular cartilage defect or fissure, series 2/27. No hip joint effusion. The adjacent included left SI joint is intact without acute abnormality. The visualized included sacrum is intact. Ligaments Suboptimally assessed by CT. Muscles and Tendons Negative for intramuscular hemorrhage, hematoma or edema. Soft tissues No focal fluid collection or hematoma. Fibroid uterus with calcified intramural fibroid noted anteriorly at the fundus. IMPRESSION: 1. No acute fracture of the left hip or  malalignment identified. 2. Small subchondral cyst of the left posterior wall of the acetabulum likely reflecting a full-thickness cartilage defect or fissure. 3. Fibroid uterus. Electronically Signed   By: Ashley Royalty M.D.   On: 10/04/2017 19:30   Dg Hip Unilat W Or Wo Pelvis 2-3 Views Left  Result Date: 10/04/2017 CLINICAL DATA:  LEFT hip pain. EXAM: DG HIP (WITH OR WITHOUT PELVIS) 2-3V LEFT COMPARISON:  None. FINDINGS: Hips are located. No evidence of pelvic fracture or sacral fracture. Dedicated view of the LEFT hip demonstrates no femoral neck fracture. IMPRESSION: No pelvic fracture.  No  LEFT hip fracture Electronically Signed   By: Suzy Bouchard M.D.   On: 10/04/2017 16:44    Procedures Procedures (including critical care time)  Medications Ordered in ED Medications  acetaminophen (TYLENOL) tablet 650 mg (650 mg Oral Given 10/04/17 1719)  ondansetron (ZOFRAN) tablet 4 mg (4 mg Oral Given 10/04/17 1746)  acetaminophen (TYLENOL) tablet 325 mg (325 mg Oral Given 10/04/17 1837)  ibuprofen (ADVIL,MOTRIN) tablet 600 mg (600 mg Oral Given 10/04/17 1837)  traMADol (ULTRAM) tablet 50 mg (50 mg Oral Given 10/04/17 1946)     Initial Impression / Assessment and Plan / ED Course  I have reviewed the triage vital signs and the nursing notes.  Pertinent labs & imaging results that were available during my care of the patient were reviewed by me and considered in my medical decision making (see chart for details).     67 year old female with a past medical history of diabetes, osteoporosis presents to ED for evaluation of left hip pain and left-sided back pain after mechanical fall that occurred prior to arrival at her house.  She was able to stand up, walk to a chair and sit down after she lost her footing while walking in her living room and hit the left side of her body on a sofa.  Here there is no visible deformity, changes to range of motion noted.  No rotation noted.  Area is neurovascularly  intact.  No signs of urinary incontinence or saddle anesthesia.  X-ray of the hip and lumbar spine returned as negative for acute abnormality.  Patient states that she was in pain and was unable to even attempt to walk.  We tried controlling her pain but she still refused to ambulate.  CT of the hip was obtained which showed no acute bony abnormality.  She continues to be neurovascularly intact.  She eventually attempted to walk with success.  Will discharge home a short course of tramadol and advised to follow-up with PCP for further evaluation.  Advised to return to ED for any severe worsening symptoms. Patient discussed with and seen by my attending, Dr. Rex Kras.  Portions of this note were generated with [employer]. Dictation errors may occur despite best attempts at proofreading.   Final Clinical Impressions(s) / ED Diagnoses   Final diagnoses:  Left hip pain  Fall, initial encounter    ED Discharge Orders        Ordered    traMADol (ULTRAM) 50 MG tablet  Every 6 hours PRN     10/04/17 2220       Delia Heady, PA-C 10/04/17 2227    Little, Wenda Overland, MD 10/05/17 [phone]

## 2017-10-06 MED FILL — traMADol HCL 50 MG TABS: 50 | 2 days supply | Qty: 8 | Fill #0

## 2017-10-07 ENCOUNTER — Telehealth: Payer: Self-pay | Admitting: *Deleted

## 2017-10-07 ENCOUNTER — Other Ambulatory Visit: Payer: Self-pay | Admitting: Nurse Practitioner

## 2017-10-07 DIAGNOSIS — R110 Nausea: Secondary | ICD-10-CM

## 2017-10-07 DIAGNOSIS — R05 Cough: Secondary | ICD-10-CM

## 2017-10-07 DIAGNOSIS — R059 Cough, unspecified: Secondary | ICD-10-CM

## 2017-10-07 MED ORDER — ONDANSETRON 4 MG PO TBDP: 4.0000 mg | ORAL_TABLET | Freq: Three times a day (TID) | ORAL | 1 refills | Status: DC | PRN

## 2017-10-07 NOTE — Telephone Encounter (Signed)
I have also ordered a cxray as her wbc was elevated and she had a cough. She can have that done as soon as possible. Nausea medication has been sent

## 2017-10-07 NOTE — Telephone Encounter (Signed)
Patient verified DOB Patient was seen 10/04/17 for left hip pain after a fall. Patient complains of only keeping liquids down and is requesting nausea medication.

## 2017-10-08 ENCOUNTER — Ambulatory Visit (HOSPITAL_COMMUNITY)
Admission: RE | Admit: 2017-10-08 | Discharge: 2017-10-08 | Disposition: A | Discharge status: Home / Self Care | Payer: Medicare Other | Source: Ambulatory Visit | Attending: Nurse Practitioner | Admitting: Nurse Practitioner

## 2017-10-08 DIAGNOSIS — R05 Cough: Secondary | ICD-10-CM | POA: Diagnosis present

## 2017-10-08 DIAGNOSIS — R059 Cough, unspecified: Secondary | ICD-10-CM

## 2017-10-08 DIAGNOSIS — J449 Chronic obstructive pulmonary disease, unspecified: Secondary | ICD-10-CM | POA: Insufficient documentation

## 2017-10-08 MED FILL — ONDANSETRON ODT 4 MG TABLET: 4 | 10 days supply | Qty: 30 | Fill #0

## 2017-10-08 NOTE — Telephone Encounter (Signed)
Patient verified DOB Patient is aware of [facility].  Patient is also aware of chest xray being ordered with no appointment needed due to WBC being elevated with cough. No further questions at this time.

## 2017-10-09 ENCOUNTER — Telehealth: Payer: Self-pay

## 2017-10-09 NOTE — Telephone Encounter (Signed)
-----   Message from Claiborne RiggZelda W Fleming, NP sent at 10/07/2017 11:24 PM EDT ----- TSH is low which could mean hyperthyroidism of overactive thyroid. Would like for you to make a lab appointment in 4 weeks to retest.

## 2017-10-09 NOTE — Telephone Encounter (Signed)
CMA spoke to patient to inform on X-Ray results and lab results.  Pt. Understood.   Pt. Made an upcoming appt for lab work on 11/16/2017 for thyroid.

## 2017-10-09 NOTE — Telephone Encounter (Signed)
-----   Message from Claiborne RiggZelda W Fleming, NP sent at 10/08/2017  5:10 PM EDT ----- Herby AbrahamXray is normal.

## 2017-11-06 ENCOUNTER — Other Ambulatory Visit: Payer: Medicare Other

## 2017-11-18 MED FILL — ATORVASTATIN CALCIUM 40 MG: 40 | 30 days supply | Qty: 30 | Fill #6

## 2017-11-18 MED FILL — ALENDRONATE NA 70 MG TAB: 70 | 84 days supply | Qty: 12 | Fill #1

## 2017-11-19 MED FILL — TRILYTE WITH FLAVOR PACKETS: 420 | 1 days supply | Qty: 4000 | Fill #0

## 2017-11-20 MED FILL — MIRTAZAPINE 15 MG TABS: 15 | 30 days supply | Qty: 30 | Fill #1

## 2017-12-02 ENCOUNTER — Ambulatory Visit: Payer: Medicare Other | Attending: Nurse Practitioner | Admitting: Nurse Practitioner

## 2017-12-02 ENCOUNTER — Encounter: Payer: Self-pay | Admitting: Nurse Practitioner

## 2017-12-02 VITALS — BP 135/70 | HR 78 | Temp 98.1°F | Ht 69.0 in | Wt 108.6 lb

## 2017-12-02 DIAGNOSIS — R7989 Other specified abnormal findings of blood chemistry: Secondary | ICD-10-CM | POA: Diagnosis not present

## 2017-12-02 DIAGNOSIS — Z882 Allergy status to sulfonamides status: Secondary | ICD-10-CM | POA: Diagnosis not present

## 2017-12-02 DIAGNOSIS — M810 Age-related osteoporosis without current pathological fracture: Secondary | ICD-10-CM | POA: Diagnosis not present

## 2017-12-02 DIAGNOSIS — Z7984 Long term (current) use of oral hypoglycemic drugs: Secondary | ICD-10-CM | POA: Diagnosis not present

## 2017-12-02 DIAGNOSIS — E785 Hyperlipidemia, unspecified: Secondary | ICD-10-CM | POA: Diagnosis not present

## 2017-12-02 DIAGNOSIS — Z803 Family history of malignant neoplasm of breast: Secondary | ICD-10-CM | POA: Diagnosis not present

## 2017-12-02 DIAGNOSIS — Z7982 Long term (current) use of aspirin: Secondary | ICD-10-CM | POA: Insufficient documentation

## 2017-12-02 DIAGNOSIS — Z79899 Other long term (current) drug therapy: Secondary | ICD-10-CM | POA: Insufficient documentation

## 2017-12-02 DIAGNOSIS — E1169 Type 2 diabetes mellitus with other specified complication: Secondary | ICD-10-CM | POA: Diagnosis present

## 2017-12-02 DIAGNOSIS — Z7983 Long term (current) use of bisphosphonates: Secondary | ICD-10-CM | POA: Insufficient documentation

## 2017-12-02 LAB — POCT GLYCOSYLATED HEMOGLOBIN (HGB A1C): Hemoglobin A1C: 8.7 % — AB (ref 4.0–5.6)

## 2017-12-02 LAB — GLUCOSE, POCT (MANUAL RESULT ENTRY): POC GLUCOSE: 209 mg/dL — AB (ref 70–99)

## 2017-12-02 NOTE — Progress Notes (Signed)
 Assessment & Plan:  Ana Hines was seen today for follow-up.  Diagnoses and all orders for this visit:  Type 2 diabetes mellitus with other specified complication, unspecified whether long term insulin use (HCC) -     Glucose (CBG) -     HgB A1c Continue blood sugar control as discussed in office today, low carbohydrate diet, and regular physical exercise as tolerated, 150 minutes per week (30 min each day, 5 days per week, or 50 min 3 days per week). Keep blood sugar logs with fasting goal of 90-130 mg/dl, post prandial (after you eat) less than 180.  For Hypoglycemia: BS <60 and Hyperglycemia BS >400; contact the clinic ASAP. Annual eye exams and foot exams are recommended.  Abnormal TSH -     TSH -     T4, free -     T3 Difficulty gaining weight.  Will need additional work up. May need endocrinology referral.  Lab Results  Component Value Date   TSH 0.318 (L) 12/02/2017      Patient has been counseled on age-appropriate routine health concerns for screening and prevention. These are reviewed and up-to-date. Referrals have been placed accordingly. Immunizations are up-to-date or declined.    Subjective:   Chief Complaint  Patient presents with  . Follow-up    Pt. is here for diabetes follow-up.    HPI Ana Hines 67 y.o. female presents to office today for follow up to DM.  Type 2 Diabetes Mellitus Disease course has been improving. There are no hypoglycemic symptoms. There are no hypoglycemic complications. Symptoms are stable. There are no diabetic complications. Risk factors for coronary artery disease include family history, dyslipidemia, diabetes mellitus, obesity, hypertension, sedentary lifestyle and stress. Current diabetic treatment includes glipizide 10mg BID. She was previously on Levemir however had stopped taking it due to difficulties with giving herself insulin. She was then switched to glipizide.  Patient is compliant with treatment all of the time however  she does not monitor her blood glucoses  at home routinely.  Weight is  stable. Patient follows a generally healthy diet. Meal planning includes avoidance of concentrated sweets. Patient has not seen a dietician. Patient is not compliant with exercise.   An ACE inhibitor/angiotensin II receptor blocker is not being taken. Patient does not see a podiatrist. Eye exam is not current.  Lab Results  Component Value Date   HGBA1C 8.7 (A) 12/02/2017    Lab Results  Component Value Date   HGBA1C 9.6 (A) 08/20/2017    Review of Systems  Constitutional: Positive for weight loss. Negative for fever and malaise/fatigue.  HENT: Negative.  Negative for nosebleeds.   Eyes: Negative.  Negative for blurred vision, double vision and photophobia.  Respiratory: Negative.  Negative for cough and shortness of breath.   Cardiovascular: Negative.  Negative for chest pain, palpitations and leg swelling.  Gastrointestinal: Negative.  Negative for heartburn, nausea and vomiting.  Musculoskeletal: Positive for back pain. Negative for myalgias.  Neurological: Negative.  Negative for dizziness, focal weakness, seizures and headaches.  Psychiatric/Behavioral: Negative.  Negative for suicidal ideas.    Past Medical History:  Diagnosis Date  . Diabetes mellitus without complication (HCC) 20 years ago  . Hyperlipidemia   . Osteoporosis     Past Surgical History:  Procedure Laterality Date  . TUBAL LIGATION      Family History  Problem Relation Age of Onset  . Breast cancer Sister   . Ovarian cancer Cousin     Social History   Reviewed with no changes to be made today.   Outpatient Medications Prior to Visit  Medication Sig Dispense Refill  . ACCU-CHEK SOFTCLIX LANCETS lancets 1 each by Other route 3 (three) times daily. E11.9 100 each 11  . alendronate (FOSAMAX) 70 MG tablet Take 1 tablet (70 mg total) by mouth once a week. Take with a full glass of water on an empty stomach. 12 tablet 3  . aspirin EC 81  MG tablet Take 1 tablet (81 mg total) by mouth daily. 30 tablet 11  . atorvastatin (LIPITOR) 40 MG tablet Take 1 tablet (40 mg total) by mouth daily. 90 tablet 3  . Blood Glucose Monitoring Suppl (ACCU-CHEK AVIVA PLUS) w/Device KIT USE AS DIRECTED BY PHYSICIAN 1 kit 0  . glipiZIDE (GLUCOTROL) 10 MG tablet Take 1 tablet (10 mg total) by mouth 2 (two) times daily. 60 tablet 5  . glucose blood (ACCU-CHEK AVIVA PLUS) test strip 1 each by Other route daily. E11.9 100 each 11  . Lancet Devices (ACCU-CHEK SOFTCLIX) lancets 1 each by Other route 3 (three) times daily. E11.9 1 each 0  . Naproxen Sodium (ALEVE PO) Take by mouth.    . ondansetron (ZOFRAN ODT) 4 MG disintegrating tablet Take 1 tablet (4 mg total) by mouth every 8 (eight) hours as needed for nausea or vomiting. 30 tablet 1  . ammonium lactate (LAC-HYDRIN) 12 % cream Apply topically as needed for dry skin. (Patient not taking: Reported on 08/20/2017) 385 g 11  . aspirin-sod bicarb-citric acid (ALKA-SELTZER) 325 MG TBEF tablet Take 325 mg by mouth every 6 (six) hours as needed (indigestion).    . Calcium Citrate 200 MG TABS Take 2 tablets (400 mg total) by mouth daily. (Patient not taking: Reported on 08/05/2016) 60 tablet 11  . Cholecalciferol (VITAMIN D3) 2000 units TABS Take 2,000 Units by mouth daily. (Patient not taking: Reported on 08/20/2017) 30 tablet 11  . Insulin Pen Needle (B-D ULTRAFINE III SHORT PEN) 31G X 8 MM MISC 1 application by Does not apply route daily. (Patient not taking: Reported on 08/20/2017) 90 each 3  . meloxicam (MOBIC) 7.5 MG tablet Take 1 tablet (7.5 mg total) by mouth daily. (Patient not taking: Reported on 10/01/2017) 30 tablet 0  . mirtazapine (REMERON) 15 MG tablet Take 1 tablet (15 mg total) by mouth at bedtime. 30 tablet 1  . Misc. Devices (ADJUSTABLE ALUMINUM CANE 7/8") MISC 1 each by Does not apply route as needed. Diagnosis code M48.54XA (Patient not taking: Reported on 12/02/2017) 1 each 0  . pneumococcal 13-valent  conjugate vaccine (PREVNAR 13) SUSP injection To be administered by the Pharmacist (Patient not taking: Reported on 08/20/2017) 0.5 mL 0  . traMADol (ULTRAM) 50 MG tablet Take 1 tablet (50 mg total) by mouth every 6 (six) hours as needed. (Patient not taking: Reported on 12/02/2017) 8 tablet 0   No facility-administered medications prior to visit.     Allergies  Allergen Reactions  . Sulfa Antibiotics Hives    Has patient had a PCN reaction causing immediate rash, facial/tongue/throat swelling, SOB or lightheadedness with hypotension: Unknown Has patient had a PCN reaction causing severe rash involving mucus membranes or skin necrosis: Unknown Has patient had a PCN reaction that required hospitalization: Unknown Has patient had a PCN reaction occurring within the last 10 years: Unknown If all of the above answers are "NO", then may proceed with Cephalosporin use.   . Sulfonamide Derivatives        Objective:      BP 135/70 (BP Location: Left Arm, Patient Position: Sitting, Cuff Size: Normal)   Pulse 78   Temp 98.1 F (36.7 C) (Oral)   Ht 5' 9" (1.753 m)   Wt 108 lb 9.6 oz (49.3 kg)   SpO2 100%   BMI 16.04 kg/m  Wt Readings from Last 3 Encounters:  12/02/17 108 lb 9.6 oz (49.3 kg)  10/04/17 106 lb (48.1 kg)  10/01/17 106 lb 6.4 oz (48.3 kg)    Physical Exam  Constitutional: She is oriented to person, place, and time. She appears well-developed and well-nourished. She is cooperative.  HENT:  Head: Normocephalic and atraumatic.  Eyes: EOM are normal.  Neck: Normal range of motion.  Cardiovascular: Normal rate, regular rhythm and normal heart sounds. Exam reveals no gallop and no friction rub.  No murmur heard. Pulmonary/Chest: Effort normal and breath sounds normal. No tachypnea. No respiratory distress. She has no decreased breath sounds. She has no wheezes. She has no rhonchi. She has no rales. She exhibits no tenderness.  Abdominal: Bowel sounds are normal.  Musculoskeletal:  Normal range of motion. She exhibits no edema.  Neurological: She is alert and oriented to person, place, and time. Coordination normal.  Skin: Skin is warm and dry.  Psychiatric: She has a normal mood and affect. Her behavior is normal. Judgment and thought content normal.  Nursing note and vitals reviewed.        Patient has been counseled extensively about nutrition and exercise as well as the importance of adherence with medications and regular follow-up. The patient was given clear instructions to go to ER or return to medical center if symptoms don't improve, worsen or new problems develop. The patient verbalized understanding.   Follow-up: No follow-ups on file.   [name], FNP-BC [facility], [place]   12/04/2017, 8:23 PM

## 2017-12-02 NOTE — Patient Instructions (Signed)
Her new appointment is  02-09-18 @ 3:15pm with Dr Don[facility]    Diabetes blood sugar goals  Fasting in AM before breakfast which means at least 8 hrs of no eating or drinking) except water or unsweetened coffee or tea): 90-130 2 hrs after meals: < 180,   Hypoglycemia or low blood sugar: < 70 (You should not have hypoglycemia.)  Aim for 30 minutes of exercise most days. Rethink what you drink. Water is great! Aim for 2-3 Carb Choices per meal (30-45 grams) +/- 1 either way  Aim for 0-15 Carbs per snack if hungry  Include protein in moderation with your meals and snacks  Consider reading food labels for Total Carbohydrate and Fat Grams of foods  Consider checking BG at alternate times per day  Continue taking medication as directed Be mindful about how much sugar you are adding to beverages and other foods. Fruit Punch - find one with no sugar  Measure and decrease portions of carbohydrate foods  Make your plate and don't go back for seconds

## 2017-12-03 LAB — TSH: TSH: 0.318 u[IU]/mL — AB (ref 0.450–4.500)

## 2017-12-03 LAB — T4, FREE: Free T4: 1.09 ng/dL (ref 0.82–1.77)

## 2017-12-03 LAB — T3: T3, Total: 119 ng/dL (ref 71–180)

## 2017-12-04 ENCOUNTER — Encounter: Payer: Self-pay | Admitting: Nurse Practitioner

## 2017-12-08 ENCOUNTER — Telehealth: Payer: Self-pay

## 2017-12-08 NOTE — Telephone Encounter (Signed)
CMA spoke to patient to inform on lab results.  Patient verified DOB. Patient understood.  

## 2017-12-08 NOTE — Telephone Encounter (Signed)
-----   Message from Claiborne RiggZelda W Fleming, NP sent at 12/04/2017  8:54 PM EDT ----- Thyroid levels continue to remains low. Will refer you to endocrinology for further evaluation.

## 2017-12-15 ENCOUNTER — Telehealth: Payer: Self-pay | Admitting: *Deleted

## 2017-12-15 NOTE — Telephone Encounter (Signed)
RETURNED PATIENT'S PHONE CALL, VM NOT SET UP UNABLE TO LEAVE MESSAGE

## 2017-12-17 ENCOUNTER — Ambulatory Visit: Payer: Medicare Other | Admitting: Podiatry

## 2018-01-01 ENCOUNTER — Other Ambulatory Visit: Payer: Self-pay

## 2018-01-01 DIAGNOSIS — E1165 Type 2 diabetes mellitus with hyperglycemia: Secondary | ICD-10-CM

## 2018-01-01 MED ORDER — ATORVASTATIN CALCIUM 40 MG PO TABS: 40.0000 mg | ORAL_TABLET | Freq: Every day | ORAL | 2 refills | Status: DC

## 2018-01-01 MED FILL — ATORVASTATIN CALCIUM 40 MG: 40 | 30 days supply | Qty: 30 | Fill #0

## 2018-01-26 MED FILL — glipiZIDE 10 MG TABS: 10 | 30 days supply | Qty: 60 | Fill #3

## 2018-01-28 ENCOUNTER — Encounter: Payer: Self-pay | Admitting: Endocrinology

## 2018-02-02 MED FILL — ATORVASTATIN CALCIUM 40 MG: 40 | 30 days supply | Qty: 30 | Fill #1

## 2018-02-09 ENCOUNTER — Other Ambulatory Visit: Payer: Self-pay

## 2018-02-09 ENCOUNTER — Ambulatory Visit (INDEPENDENT_AMBULATORY_CARE_PROVIDER_SITE_OTHER): Payer: Medicare Other | Admitting: Podiatry

## 2018-02-09 ENCOUNTER — Encounter: Payer: Self-pay | Admitting: Podiatry

## 2018-02-09 DIAGNOSIS — M79674 Pain in right toe(s): Secondary | ICD-10-CM

## 2018-02-09 DIAGNOSIS — B351 Tinea unguium: Secondary | ICD-10-CM

## 2018-02-09 DIAGNOSIS — M79675 Pain in left toe(s): Secondary | ICD-10-CM

## 2018-02-16 MED FILL — ALENDRONATE NA 70 MG TAB: 70 | 84 days supply | Qty: 12 | Fill #2

## 2018-02-19 NOTE — Progress Notes (Signed)
Subjective:  "My toenails hurt in my shoes and it feels the nail on my right 2nd toe is coming off and is turning sideways.  Ana Hines presents for follow up of painful, elongated mycotic toenails. Pain is aggravated when wearing enclosed shoe gear. Pain is relieved with periodic professional debridement.  Objective: Vascular Examination: Capillary refill time immediate x 10 digits Dorsalis pedis and posterior tibial pulses present b/l No digital hair x 10 digits Skin temperature warm to warm b/l  Dermatological Examination: Skin thin and atrophic b/l Toenails 1-5 b/l discolored, thick, dystrophic with subungual debris and pain with palpation to nailbeds due to thickness of nails. Right 2nd digit is thick, mycotic and growing in a lateral direction with no break in skin, no edema, no erythema.  Musculoskeletal: Muscle strength 5/5 to all LE muscle groups  Neurological: Sensation intact with 10 gram monofilament. Vibratory sensation intact.  Assessment: Painful onychomycosis toenails 1-5 b/l   Plan: 1. Toenails 1-5 b/l were debrided in length and girth without iatrogenic bleeding. Right 2nd digit toenail debrided to level of adherence. 2. Patient to continue soft, supportive shoe gear 3. Patient to report any pedal injuries to medical professional immediately. 4. Follow up 3 months. Patient/POA to call should there be a concern in the interim.

## 2018-03-04 ENCOUNTER — Ambulatory Visit: Payer: Medicare Other | Attending: Nurse Practitioner | Admitting: Nurse Practitioner

## 2018-03-04 ENCOUNTER — Encounter: Payer: Self-pay | Admitting: Nurse Practitioner

## 2018-03-04 VITALS — BP 123/69 | HR 100 | Temp 98.2°F | Ht 69.0 in | Wt 109.2 lb

## 2018-03-04 DIAGNOSIS — E785 Hyperlipidemia, unspecified: Secondary | ICD-10-CM | POA: Insufficient documentation

## 2018-03-04 DIAGNOSIS — M810 Age-related osteoporosis without current pathological fracture: Secondary | ICD-10-CM | POA: Diagnosis not present

## 2018-03-04 DIAGNOSIS — Z794 Long term (current) use of insulin: Secondary | ICD-10-CM | POA: Insufficient documentation

## 2018-03-04 DIAGNOSIS — R946 Abnormal results of thyroid function studies: Secondary | ICD-10-CM | POA: Insufficient documentation

## 2018-03-04 DIAGNOSIS — E1169 Type 2 diabetes mellitus with other specified complication: Secondary | ICD-10-CM

## 2018-03-04 DIAGNOSIS — Z882 Allergy status to sulfonamides status: Secondary | ICD-10-CM | POA: Insufficient documentation

## 2018-03-04 DIAGNOSIS — Z79899 Other long term (current) drug therapy: Secondary | ICD-10-CM | POA: Insufficient documentation

## 2018-03-04 DIAGNOSIS — E1165 Type 2 diabetes mellitus with hyperglycemia: Secondary | ICD-10-CM | POA: Insufficient documentation

## 2018-03-04 DIAGNOSIS — R7989 Other specified abnormal findings of blood chemistry: Secondary | ICD-10-CM

## 2018-03-04 DIAGNOSIS — Z7982 Long term (current) use of aspirin: Secondary | ICD-10-CM | POA: Insufficient documentation

## 2018-03-04 DIAGNOSIS — Z791 Long term (current) use of non-steroidal anti-inflammatories (NSAID): Secondary | ICD-10-CM | POA: Insufficient documentation

## 2018-03-04 LAB — POCT GLYCOSYLATED HEMOGLOBIN (HGB A1C): Hemoglobin A1C: 8.4 % — AB (ref 4.0–5.6)

## 2018-03-04 LAB — GLUCOSE, POCT (MANUAL RESULT ENTRY): POC Glucose: 287 mg/dl — AB (ref 70–99)

## 2018-03-04 MED ORDER — DULAGLUTIDE 0.75 MG/0.5ML ~~LOC~~ SOAJ: 0.7500 mg | SUBCUTANEOUS | 3 refills | Status: AC

## 2018-03-04 MED ORDER — CALCIUM CITRATE 200 MG PO TABS: 400.0000 mg | ORAL_TABLET | Freq: Every day | ORAL | 11 refills | Status: AC

## 2018-03-04 MED ORDER — GLIPIZIDE 10 MG PO TABS: 10.0000 mg | ORAL_TABLET | Freq: Two times a day (BID) | ORAL | 5 refills | Status: DC

## 2018-03-04 MED FILL — glipiZIDE 10 MG TABS: 10 | 30 days supply | Qty: 60 | Fill #0

## 2018-03-04 MED FILL — TRULICITY 0.75 MG/0.5 ML PE: 0.75 | 27 days supply | Qty: 2 | Fill #0

## 2018-03-04 NOTE — Progress Notes (Signed)
Assessment & Plan:  Ana Hines was seen today for follow-up.  Diagnoses and all orders for this visit:  Type 2 diabetes mellitus with other specified complication, unspecified whether long term insulin use (HCC) -     Glucose (CBG) -     HgB A1c -     glipiZIDE (GLUCOTROL) 10 MG tablet; Take 1 tablet (10 mg total) by mouth 2 (two) times daily. -     Dulaglutide (TRULICITY) 5.69 VX/4.8AX SOPN; Inject 0.75 mg into the skin once a week. -     CMP14+EGFR Continue blood sugar control as discussed in office today, low carbohydrate diet, and regular physical exercise as tolerated, 150 minutes per week (30 min each day, 5 days per week, or 50 min 3 days per week). Keep blood sugar logs with fasting goal of 90-130 mg/dl, post prandial (after you eat) less than 180.  For Hypoglycemia: BS <60 and Hyperglycemia BS >400; contact the clinic ASAP. Annual eye exams and foot exams are recommended.   Age-related osteoporosis without current pathological fracture -     Calcium Citrate 200 MG TABS; Take 2 tablets (400 mg total) by mouth daily.  Abnormal TSH -     TSH    Patient has been counseled on age-appropriate routine health concerns for screening and prevention. These are reviewed and up-to-date. Referrals have been placed accordingly. Immunizations are up-to-date or declined.    Subjective:   Chief Complaint  Patient presents with  . Follow-up    Pt. is here to follow-up on diabetes.    HPI Ana Hines 67 y.o. female presents to office today for DM type 2.    DM TYPE 2 Chronic and not well controlled. A1c worsening.  We had a long discussion regarding tighter control of her diabetes with significant dietary modifications. She eats lots of carbs: rice with gravy, fried chicken, fried pork chops, corn, pot pies. She checks her blood glucose levels "only when I feel jittery". She stopped taking levemir because she couldn't use a pen where she can actually see the needle going into her skin.  We can try trulicity as it is self injecting with hidden needle. She endorses medication compliance taking glipizide 7m BID.  Lab Results  Component Value Date   HGBA1C 8.4 (A) 03/04/2018    Osteoporosis Bone density 06-17-2014 showing osteoporosis of lumbar spine. T score -2.6. She was restarted on Fosamax in June which she had not been taking as prescribed prior to the restart. I will verify with the pharmacy whether she has been compliant with taking fosamax since June.   Review of Systems  Constitutional: Negative for fever, malaise/fatigue and weight loss.  HENT: Negative.  Negative for nosebleeds.   Eyes: Negative.  Negative for blurred vision, double vision and photophobia.  Respiratory: Negative.  Negative for cough and shortness of breath.   Cardiovascular: Negative.  Negative for chest pain, palpitations and leg swelling.  Gastrointestinal: Negative.  Negative for heartburn, nausea and vomiting.  Musculoskeletal: Negative.  Negative for falls and myalgias.  Neurological: Negative.  Negative for dizziness, focal weakness, seizures and headaches.  Psychiatric/Behavioral: Negative.  Negative for suicidal ideas.    Past Medical History:  Diagnosis Date  . Diabetes mellitus without complication (HCC) 20 years ago  . Hyperlipidemia   . Osteoporosis     Past Surgical History:  Procedure Laterality Date  . TUBAL LIGATION      Family History  Problem Relation Age of Onset  . Breast cancer Sister   .  Ovarian cancer Cousin     Social History Reviewed with no changes to be made today.   Outpatient Medications Prior to Visit  Medication Sig Dispense Refill  . ACCU-CHEK SOFTCLIX LANCETS lancets 1 each by Other route 3 (three) times daily. E11.9 100 each 11  . alendronate (FOSAMAX) 70 MG tablet Take 1 tablet (70 mg total) by mouth once a week. Take with a full glass of water on an empty stomach. 12 tablet 3  . aspirin EC 81 MG tablet Take 1 tablet (81 mg total) by mouth  daily. 30 tablet 11  . aspirin-sod bicarb-citric acid (ALKA-SELTZER) 325 MG TBEF tablet Take 325 mg by mouth every 6 (six) hours as needed (indigestion).    Marland Kitchen atorvastatin (LIPITOR) 40 MG tablet Take 1 tablet (40 mg total) by mouth daily. 30 tablet 2  . Blood Glucose Monitoring Suppl (ACCU-CHEK AVIVA PLUS) w/Device KIT USE AS DIRECTED BY PHYSICIAN 1 kit 0  . glucose blood (ACCU-CHEK AVIVA PLUS) test strip 1 each by Other route daily. E11.9 100 each 11  . Lancet Devices (ACCU-CHEK SOFTCLIX) lancets 1 each by Other route 3 (three) times daily. E11.9 1 each 0  . Naproxen Sodium (ALEVE PO) Take by mouth.    . ondansetron (ZOFRAN ODT) 4 MG disintegrating tablet Take 1 tablet (4 mg total) by mouth every 8 (eight) hours as needed for nausea or vomiting. 30 tablet 1  . glipiZIDE (GLUCOTROL) 10 MG tablet Take 1 tablet (10 mg total) by mouth 2 (two) times daily. 60 tablet 5  . mirtazapine (REMERON) 15 MG tablet Take 15 mg by mouth at bedtime.  1  . ammonium lactate (LAC-HYDRIN) 12 % cream Apply topically as needed for dry skin. (Patient not taking: Reported on 08/20/2017) 385 g 11  . Cholecalciferol (VITAMIN D3) 2000 units TABS Take 2,000 Units by mouth daily. (Patient not taking: Reported on 08/20/2017) 30 tablet 11  . meloxicam (MOBIC) 7.5 MG tablet Take 1 tablet (7.5 mg total) by mouth daily. (Patient not taking: Reported on 10/01/2017) 30 tablet 0  . mirtazapine (REMERON) 15 MG tablet Take 1 tablet (15 mg total) by mouth at bedtime. 30 tablet 1  . traMADol (ULTRAM) 50 MG tablet Take 1 tablet (50 mg total) by mouth every 6 (six) hours as needed. (Patient not taking: Reported on 12/02/2017) 8 tablet 0  . Calcium Citrate 200 MG TABS Take 2 tablets (400 mg total) by mouth daily. (Patient not taking: Reported on 08/05/2016) 60 tablet 11  . Insulin Detemir (LEVEMIR FLEXTOUCH) 100 UNIT/ML Pen Inject into the skin.    . Insulin Pen Needle (B-D ULTRAFINE III SHORT PEN) 31G X 8 MM MISC 1 application by Does not apply  route daily. (Patient not taking: Reported on 08/20/2017) 90 each 3  . Misc. Devices (ADJUSTABLE ALUMINUM CANE 7/8") MISC 1 each by Does not apply route as needed. Diagnosis code M48.54XA (Patient not taking: Reported on 12/02/2017) 1 each 0  . pneumococcal 13-valent conjugate vaccine (PREVNAR 13) SUSP injection To be administered by the Pharmacist (Patient not taking: Reported on 08/20/2017) 0.5 mL 0   No facility-administered medications prior to visit.     Allergies  Allergen Reactions  . Sulfa Antibiotics Hives    Has patient had a PCN reaction causing immediate rash, facial/tongue/throat swelling, SOB or lightheadedness with hypotension: Unknown Has patient had a PCN reaction causing severe rash involving mucus membranes or skin necrosis: Unknown Has patient had a PCN reaction that required hospitalization: Unknown Has patient had  a PCN reaction occurring within the last 10 years: Unknown If all of the above answers are "NO", then may proceed with Cephalosporin use.   . Sulfonamide Derivatives        Objective:    BP 123/69 (BP Location: Left Arm, Patient Position: Sitting, Cuff Size: Normal)   Pulse 100   Temp 98.2 F (36.8 C) (Oral)   Ht _0  (1.753 m)   Wt 109 lb 3.2 oz (49.5 kg)   SpO2 97%   BMI 16.13 kg/m  Wt Readings from Last 3 Encounters:  03/04/18 109 lb 3.2 oz (49.5 kg)  12/02/17 108 lb 9.6 oz (49.3 kg)  10/04/17 106 lb (48.1 kg)    Physical Exam  Constitutional: She is oriented to person, place, and time. She appears well-developed and well-nourished. She is cooperative.  HENT:  Head: Normocephalic and atraumatic.  Eyes: EOM are normal.  Neck: Normal range of motion.  Cardiovascular: Regular rhythm, normal heart sounds and intact distal pulses. Tachycardia present. Exam reveals no gallop and no friction rub.  No murmur heard. Pulmonary/Chest: Effort normal and breath sounds normal. No tachypnea. No respiratory distress. She has no decreased breath sounds. She  has no wheezes. She has no rhonchi. She has no rales. She exhibits no tenderness.  Abdominal: Soft. Bowel sounds are normal.  Musculoskeletal: Normal range of motion. She exhibits no edema.  Neurological: She is alert and oriented to person, place, and time. Coordination normal.  Skin: Skin is warm and dry.  Psychiatric: She has a normal mood and affect. Her behavior is normal. Judgment and thought content normal.  Nursing note and vitals reviewed.      Patient has been counseled extensively about nutrition and exercise as well as the importance of adherence with medications and regular follow-up. The patient was given clear instructions to go to ER or return to medical center if symptoms don't improve, worsen or new problems develop. The patient verbalized understanding.   Follow-up: Return in about 4 weeks (around 04/01/2018) for f/u with pharmacist. then appt to see me in 3 months.   [name], FNP-BC [facility] [place], [place]   03/04/2018, 9:39 PM

## 2018-03-05 LAB — CMP14+EGFR
A/G RATIO: 1.6 (ref 1.2–2.2)
ALT: 23 IU/L (ref 0–32)
AST: 28 IU/L (ref 0–40)
Albumin: 4.3 g/dL (ref 3.6–4.8)
Alkaline Phosphatase: 77 IU/L (ref 39–117)
BILIRUBIN TOTAL: 0.2 mg/dL (ref 0.0–1.2)
BUN/Creatinine Ratio: 20 (ref 12–28)
BUN: 18 mg/dL (ref 8–27)
CALCIUM: 9.5 mg/dL (ref 8.7–10.3)
CO2: 21 mmol/L (ref 20–29)
Chloride: 101 mmol/L (ref 96–106)
Creatinine, Ser: 0.91 mg/dL (ref 0.57–1.00)
GFR calc Af Amer: 76 mL/min/{1.73_m2} (ref >59)
GFR, EST NON AFRICAN AMERICAN: 66 mL/min/{1.73_m2} (ref >59)
Globulin, Total: 2.7 g/dL (ref 1.5–4.5)
Glucose: 265 mg/dL — ABNORMAL HIGH (ref 65–99)
POTASSIUM: 4.7 mmol/L (ref 3.5–5.2)
SODIUM: 139 mmol/L (ref 134–144)
Total Protein: 7 g/dL (ref 6.0–8.5)

## 2018-03-05 LAB — TSH: TSH: 0.477 u[IU]/mL (ref 0.450–4.500)

## 2018-03-09 ENCOUNTER — Telehealth: Payer: Self-pay

## 2018-03-09 NOTE — Telephone Encounter (Signed)
CMA spoke to patient to inform on results.  Patient verified DOB. Pt. Understood.  

## 2018-03-09 NOTE — Telephone Encounter (Signed)
-----   Message from Claiborne RiggZelda W Fleming, NP sent at 03/05/2018  8:24 PM EST ----- Thyroid level is low normal. Will continue to monitor.

## 2018-03-10 MED FILL — ATORVASTATIN CALCIUM 40 MG: 40 | 30 days supply | Qty: 30 | Fill #2

## 2018-03-13 ENCOUNTER — Telehealth: Payer: Self-pay

## 2018-03-13 NOTE — Telephone Encounter (Signed)
Patient stated she is taking her Fosamax weekly and have not been taking her Calcium.  Pt. Was inform to not take her Calcium if she is doing Fosamax weekly.

## 2018-03-13 NOTE — Telephone Encounter (Signed)
-----   Message from Claiborne RiggZelda W Fleming, NP sent at 03/04/2018  9:40 PM EST ----- Alesia BandaBien please try to call Ms Annabell HowellsWrenn and ask her is she still taking the weekly fosamax. If she is then she does not have to take the calcium.

## 2018-03-16 ENCOUNTER — Ambulatory Visit: Payer: Medicare Other | Admitting: Family Medicine

## 2018-03-16 ENCOUNTER — Encounter: Payer: Self-pay | Admitting: Pharmacist

## 2018-03-17 ENCOUNTER — Ambulatory Visit: Payer: Medicare Other | Attending: Nurse Practitioner | Admitting: Pharmacist

## 2018-03-17 ENCOUNTER — Ambulatory Visit: Payer: Medicare Other | Admitting: Nurse Practitioner

## 2018-03-17 ENCOUNTER — Encounter: Payer: Self-pay | Admitting: Pharmacist

## 2018-03-17 DIAGNOSIS — Z7189 Other specified counseling: Secondary | ICD-10-CM | POA: Insufficient documentation

## 2018-03-17 DIAGNOSIS — Z7984 Long term (current) use of oral hypoglycemic drugs: Secondary | ICD-10-CM | POA: Diagnosis not present

## 2018-03-17 NOTE — Progress Notes (Signed)
Patient was educated on the use of Trulicity. Reviewed necessary supplies and operation of the pen. Also reviewed goal blood glucose levels. Patient was able to demonstrate use. We administered her first dose here and pt knows that her next dose is due next Tuesday (03/24/18). All questions and concerns were addressed.

## 2018-04-01 ENCOUNTER — Ambulatory Visit: Payer: Medicare Other | Admitting: Pharmacist

## 2018-04-08 ENCOUNTER — Ambulatory Visit: Payer: Medicare Other | Admitting: Nurse Practitioner

## 2018-04-08 ENCOUNTER — Ambulatory Visit: Payer: Medicare Other | Attending: Nurse Practitioner | Admitting: Pharmacist

## 2018-04-08 DIAGNOSIS — E1165 Type 2 diabetes mellitus with hyperglycemia: Secondary | ICD-10-CM | POA: Diagnosis not present

## 2018-04-08 DIAGNOSIS — F17210 Nicotine dependence, cigarettes, uncomplicated: Secondary | ICD-10-CM | POA: Insufficient documentation

## 2018-04-08 DIAGNOSIS — E1169 Type 2 diabetes mellitus with other specified complication: Secondary | ICD-10-CM

## 2018-04-08 LAB — GLUCOSE, POCT (MANUAL RESULT ENTRY): POC Glucose: 167 mg/dl — AB (ref 70–99)

## 2018-04-08 MED ORDER — GLUCOSE BLOOD VI STRP: ORAL_STRIP | 12 refills | Status: DC

## 2018-04-08 MED ORDER — DULAGLUTIDE 1.5 MG/0.5ML ~~LOC~~ SOAJ: 1.5000 mg | SUBCUTANEOUS | 2 refills | Status: DC

## 2018-04-08 MED FILL — TRULICITY 1.5 MG/0.5 ML PEN: 1.5 | 28 days supply | Qty: 2 | Fill #0

## 2018-04-08 MED FILL — ONE TOUCH ULTRA TEST STRIPS: 30 days supply | Qty: 100 | Fill #0

## 2018-04-08 NOTE — Progress Notes (Signed)
    S:    PCP: Zelda  No chief complaint on file.  Patient arrives in good spirits. Presents for diabetes management at the request of Zelda. Patient was referred on 03/04/18. I last saw her on 03/17/18 for Trulicity teaching.   Today, pt reports doing well with Trulicity. Denies NV, abdominal pain. Denies problems with injection. She is currently taking the 0.75 mg weekly dose.   She reports hypoglycemia. Endorses shakiness and anxiety when this happens. Denies symptoms of hyperglycemia. She is doing better with her diet. Reports incorporating more salads and limiting carbohydrates/starches. She reports eating chocolate chip cookies on Thursday as her "treat". She repots walking ~30 minutes daily.   Family/Social History: drinks 1-2 beers daily, current every day smoker (0.5 PPD)  Insurance coverage/medication affordability: [employer]  Patient reports adherence with medications.  Current diabetes medications include: glipizide 10 mg BID, Trulicity 0.75 mg weekly  O:  POCT glucose: 167 (fasting) Home fasting CBG: 60-120s Home random/post-prandial CBG: Higher 100s - 240.  Lab Results  Component Value Date   HGBA1C 8.4 (A) 03/04/2018   There were no vitals filed for this visit.  Lipid Panel     Component Value Date/Time   CHOL 154 05/12/2017 1110   TRIG 101 05/12/2017 1110   HDL 49 05/12/2017 1110   CHOLHDL 3.1 05/12/2017 1110   CHOLHDL 3.8 05/27/2014 1744   VLDL 24 05/27/2014 1744   LDLCALC 85 05/12/2017 1110   Clinical ASCVD: No  The 10-year ASCVD risk score Denman George(Goff DC Jr., et al., 2013) is: 26.8%   Values used to calculate the score:     Age: [age] years     Sex: Female     Is Non-Hispanic African American: Yes     Diabetic: Yes     Tobacco smoker: Yes     Systolic Blood Pressure: 123 mmHg     Is BP treated: No     HDL Cholesterol: 49 mg/dL     Total Cholesterol: 154 mg/dL   A/P: Diabetes longstanding currently uncontrolled based on last A1c. Home  fasting sugars improving with some hypoglycemia . Patient is able to verbalize appropriate hypoglycemia management plan. Patient is adherent with medication. Control is suboptimal due to some dietary indiscretion. She cannot tolerate metformin. Will increase Trulicity today and have her hold glipizide d/t hypoglycemia. She has been instructed to re-start if she notices fasting sugars increasing above 130.   -Discontinued glipizide   -Increased dose of Trulicity to 1.5 mg weekly.  -Extensively discussed pathophysiology of DM, recommended lifestyle interventions, dietary effects on glycemic control -Counseled on s/sx of and management of hypoglycemia -Next A1C anticipated 05/2018.   ASCVD risk - primary prevention in patient with DM. Last LDL 85. ASCVD risk score is >20%  - high intensity statin indicated.  -Continued atorvastatin 40 mg.  -LFTs WNL  Written patient instructions provided.  Total time in face to face counseling 15 minutes.   Follow up PCP Clinic Visit 06/05/2018.     Butch PennyLuke Van Ausdall, PharmD, CPP Clinical Pharmacist [facility] [phone]

## 2018-04-08 NOTE — Patient Instructions (Signed)
Thank you for coming to see me today. Please do the following:  1. Increase Trulicity to 1.5 mg weekly on Tuesdays. 2. Keep taking atorvastatin daily. 3. Stop taking glipizide.  4. Continue checking blood sugars at home. It's really important that you record these and bring these in to your next doctor's appointment. If you get in readings above 500 or lower than 70, call me or the clinic to let your doctor know. See below on how to treat low blood sugar.  5. Continue making the lifestyle changes we've discussed together during our visit. Diet and exercise play a significant role in improving your blood sugars.  6. Follow-up with Zelda on 06/05/2018.   Hypoglycemia or low blood sugar:   Low blood sugar can happen quickly and may become an emergency if not treated right away.   While this shouldn't happen often, it can be brought upon if you skip a meal or do not eat enough. Also, if your insulin or other diabetes medications are dosed too high, this can cause your blood sugar to go to low.   Warning signs of low blood sugar include: 1. Feeling shaky or dizzy 2. Feeling weak or tired  3. Excessive hunger 4. Feeling anxious or upset  5. Sweating even when you aren't exercising  What to do if I experience low blood sugar? 1. Check your blood sugar with your meter. If lower than 70, proceed to step 2.  2. Treat with 3-4 glucose tablets or 3 packets of regular sugar. If these aren't around, you can try hard candy. Yet another option would be to drink 4 ounces of fruit juice or 6 ounces of REGULAR soda.  3. Re-check your sugar in 15 minutes. If it is still below 70, do what you did in step 2 again. If has come back up, go ahead and eat a snack or small meal at this time.

## 2018-04-09 ENCOUNTER — Encounter: Payer: Self-pay | Admitting: Pharmacist

## 2018-04-29 ENCOUNTER — Other Ambulatory Visit: Payer: Self-pay | Admitting: Nurse Practitioner

## 2018-04-29 DIAGNOSIS — E1165 Type 2 diabetes mellitus with hyperglycemia: Secondary | ICD-10-CM

## 2018-04-30 MED FILL — ATORVASTATIN CALCIUM 40 MG: 40 | 30 days supply | Qty: 30 | Fill #0

## 2018-05-12 ENCOUNTER — Ambulatory Visit: Payer: Medicare Other | Admitting: Podiatry

## 2018-05-14 MED FILL — TRULICITY 1.5 MG/0.5 ML PEN: 1.5 | 28 days supply | Qty: 2 | Fill #1

## 2018-05-27 ENCOUNTER — Ambulatory Visit (INDEPENDENT_AMBULATORY_CARE_PROVIDER_SITE_OTHER): Payer: Medicare Other | Admitting: Internal Medicine

## 2018-05-27 ENCOUNTER — Encounter: Payer: Self-pay | Admitting: Internal Medicine

## 2018-05-27 VITALS — BP 118/72 | HR 97 | Ht 69.0 in | Wt 100.6 lb

## 2018-05-27 DIAGNOSIS — R634 Abnormal weight loss: Secondary | ICD-10-CM | POA: Diagnosis not present

## 2018-05-27 DIAGNOSIS — M810 Age-related osteoporosis without current pathological fracture: Secondary | ICD-10-CM | POA: Diagnosis not present

## 2018-05-27 DIAGNOSIS — E0590 Thyrotoxicosis, unspecified without thyrotoxic crisis or storm: Secondary | ICD-10-CM | POA: Insufficient documentation

## 2018-05-27 LAB — TSH: TSH: 0.49 u[IU]/mL (ref 0.35–4.50)

## 2018-05-27 LAB — T4, FREE: Free T4: 0.84 ng/dL (ref 0.60–1.60)

## 2018-05-27 NOTE — Patient Instructions (Signed)
-   We will repeat your thyroid function today just to make sure its is still working like its supposed to do.  - I am not sure why your thyroid function test were abnormal back in the summer, this could be an inflammation that caused this issue or an autoimmune condition that can come and go.  - Your weight loss is NOT related to your thyroid function (based on summer results)

## 2018-05-27 NOTE — Progress Notes (Signed)
Name: Ana Hines  MRN/ DOB: [id number], 1951/03/01    Age/ Sex: 68 y.o., female    PCP: [name], NP   Reason for Endocrinology Evaluation: Subclinical hyperthyroidism      Date of Initial Endocrinology Evaluation: 05/27/2018     HPI: Ana Hines is a 67 y.o. female with a past medical history of T2DM, osteoporosis  With hx of compression fracture. The patient presented for initial endocrinology clinic visit on 05/27/2018 for consultative assistance with her low TSH .   Pt was sent here for low TSH that was noted on her labs in June, 2019 due to underweight. Repeat testing in August confirmed abnormal results.   She does have unexplained weight loss. She denies anxiety or jittery sensation, diarrhea, or palpitations.   She denies heat intolerance,   She denies any local neck symptoms.   Of note the patient has had T2DM since 1990's. She has been on Metformin and Glipizide in the past, she took herself off of it due to weight loss with metformin. She has been on Trulicity for the past 6 weeks.   She also has a history of osteoporosis and a history of compression fractures.  Patient states she is not on calcium, but she is on vitamin D daily.  First cousin with thyroid disease.   HISTORY:  Past Medical History:  Past Medical History:  Diagnosis Date  . Diabetes mellitus without complication (HCC) 20 years ago  . Hyperlipidemia   . Osteoporosis     Past Surgical History:  Past Surgical History:  Procedure Laterality Date  . TUBAL LIGATION        Social History:  reports that she has been smoking cigarettes. She has been smoking about 0.50 packs per day. She has never used smokeless tobacco. She reports current alcohol use of about 2.0 - 3.0 standard drinks of alcohol per week. She reports current drug use. Drug: Marijuana.  Family History: family history includes Breast cancer in her sister; Ovarian cancer in her cousin.   HOME MEDICATIONS: Allergies  as of 05/27/2018      Reactions   Sulfa Antibiotics Hives   Has patient had a PCN reaction causing immediate rash, facial/tongue/throat swelling, SOB or lightheadedness with hypotension: Unknown Has patient had a PCN reaction causing severe rash involving mucus membranes or skin necrosis: Unknown Has patient had a PCN reaction that required hospitalization: Unknown Has patient had a PCN reaction occurring within the last 10 years: Unknown If all of the above answers are "NO", then may proceed with Cephalosporin use.   Sulfonamide Derivatives       Medication List       Accurate as of May 27, 2018 10:30 AM. Always use your most recent med list.        ACCU-CHEK AVIVA PLUS w/Device Kit USE AS DIRECTED BY PHYSICIAN   accu-chek softclix lancets 1 each by Other route 3 (three) times daily. E11.9   ACCU-CHEK SOFTCLIX LANCETS lancets 1 each by Other route 3 (three) times daily. E11.9   alendronate 70 MG tablet Commonly known as:  FOSAMAX Take 1 tablet (70 mg total) by mouth once a week. Take with a full glass of water on an empty stomach.   ALEVE PO Take by mouth.   ammonium lactate 12 % cream Commonly known as:  LAC-HYDRIN Apply topically as needed for dry skin.   aspirin EC 81 MG tablet Take 1 tablet (81 mg total) by mouth daily.  aspirin-sod bicarb-citric acid 325 MG Tbef tablet Commonly known as:  ALKA-SELTZER Take 325 mg by mouth every 6 (six) hours as needed (indigestion).   atorvastatin 40 MG tablet Commonly known as:  LIPITOR TAKE 1 TABLET BY MOUTH DAILY.   Calcium Citrate 200 MG Tabs Take 2 tablets (400 mg total) by mouth daily.   Dulaglutide 1.5 MG/0.5ML Sopn Commonly known as:  TRULICITY Inject 1.5 mg into the skin once a week.   glucose blood test strip Commonly known as:  ONE TOUCH ULTRA TEST Use as instructed   meloxicam 7.5 MG tablet Commonly known as:  MOBIC Take 1 tablet (7.5 mg total) by mouth daily.   mirtazapine 15 MG tablet Commonly  known as:  REMERON Take 1 tablet (15 mg total) by mouth at bedtime.   ondansetron 4 MG disintegrating tablet Commonly known as:  ZOFRAN ODT Take 1 tablet (4 mg total) by mouth every 8 (eight) hours as needed for nausea or vomiting.   traMADol 50 MG tablet Commonly known as:  ULTRAM Take 1 tablet (50 mg total) by mouth every 6 (six) hours as needed.   Vitamin D3 50 MCG (2000 UT) Tabs Take 2,000 Units by mouth daily.         REVIEW OF SYSTEMS: A comprehensive ROS was conducted with the patient and is negative except as per HPI and below:  Review of Systems  Constitutional: Positive for malaise/fatigue and weight loss.  HENT: Positive for congestion. Negative for sore throat.   Eyes: Negative for blurred vision and pain.  Respiratory: Negative for cough.   Cardiovascular: Negative for chest pain and palpitations.  Gastrointestinal: Negative for diarrhea and nausea.  Musculoskeletal: Positive for back pain. Negative for neck pain.  Neurological: Positive for tingling. Negative for tremors.  Psychiatric/Behavioral: Negative for depression. The patient is not nervous/anxious.        OBJECTIVE:  VS: BP 118/72 (BP Location: Left Arm, Patient Position: Sitting, Cuff Size: Normal)   Pulse 97   Ht '5\' 9"'  (1.753 m)   Wt 100 lb 9.6 oz (45.6 kg)   SpO2 98%   BMI 14.86 kg/m    Wt Readings from Last 3 Encounters:  05/27/18 100 lb 9.6 oz (45.6 kg)  03/04/18 109 lb 3.2 oz (49.5 kg)  12/02/17 108 lb 9.6 oz (49.3 kg)     EXAM: General: Pt appears well and is in NAD  Hydration: Well-hydrated with moist mucous membranes and good skin turgor  Eyes: External eye exam normal without stare, lid lag or exophthalmos.  EOM intact.  PERRL.  Ears, Nose, Throat: Hearing: Grossly intact bilaterally Dental: Good dentition  Throat: Clear without mass, erythema or exudate  Neck: General: Supple without adenopathy. Thyroid: Thyroid size normal.  No goiter or nodules appreciated. No thyroid bruit.   Lungs: Clear with good BS bilat with no rales, rhonchi, or wheezes  Heart: Auscultation: RRR.  Abdomen: Normoactive bowel sounds, soft, nontender, without masses or organomegaly palpable  Extremities:  BL LE: No pretibial edema normal ROM and strength.  Skin: Hair: Texture and amount normal with gender appropriate distribution Skin Inspection: No rashes. Skin Palpation: Skin temperature, texture, and thickness normal to palpation  Neuro: Cranial nerves: II - XII grossly intact  Motor: Normal strength throughout DTRs: 2+ and symmetric in UE without delay in relaxation phase  Mental Status: Judgment, insight: Intact Orientation: Oriented to time, place, and person Mood and affect: No depression, anxiety, or agitation     DATA REVIEWED:   Results for  MATIA, ZELADA (MRN [id number]) as of 05/27/2018 21:23  Ref. Range 03/04/2018 11:56 05/27/2018 11:04  TSH Latest Ref Range: 0.35 - 4.50 uIU/mL 0.477 0.49  T4,Free(Direct) Latest Ref Range: 0.60 - 1.60 ng/dL  0.84    ASSESSMENT/PLAN/RECOMMENDATIONS:   1. Hx of subclinical Hyperthyroidism:  - Patient is clinically euthyroid - Her weight loss is not attributed to her thyroid. - Repeat thyroid function test today are normal - It is unclear what caused her abnormal thyroid function tests in the past this could be related to a lab error versus thyroiditis versus subclinical Graves' disease. - No further intervention needed at this time  2.  Weight loss:  -Her weight loss is not related to her abnormal thyroid function test In the summer 2019.  -I suspect that Trulicity has contributed to weight loss, I would recommend stopping Trulicity due to concerns of weight loss, other options for diabetes management could include sulfonylurea, Prandin or insulin -If her weight loss continues despite adjusting her diabetes medications then I would recommend cancer screening based on her history.  3.  Osteoporosis:  - Continue vitamin D 1000 units  daily -I would recommend calcium carbonate or citrate at 1200 mg daily, patient is under the impression she was told to discontinue calcium by provider, I will leave that to the discretion of her PCP. -Continue Fosamax 70 mg weekly  Follow-up PRN   Signed electronically by: Mack Guise, MD  [facility]  [facility] [address] [place], [place] [address] Phone: [phone] FAX: [phone]   CC: [name], NP [place] [address] Phone: [phone] Fax: [phone]   Return to Endocrinology clinic as below: Future Appointments  Date Time Provider [place]  06/05/2018 11:10 AM [name], NP [facility] None

## 2018-05-28 ENCOUNTER — Encounter: Payer: Self-pay | Admitting: Internal Medicine

## 2018-05-28 LAB — T3: T3, Total: 103 ng/dL (ref 76–181)

## 2018-06-05 ENCOUNTER — Encounter: Payer: Self-pay | Admitting: Nurse Practitioner

## 2018-06-05 ENCOUNTER — Other Ambulatory Visit: Payer: Self-pay

## 2018-06-05 ENCOUNTER — Ambulatory Visit: Payer: Medicare Other | Attending: Nurse Practitioner | Admitting: Nurse Practitioner

## 2018-06-05 VITALS — BP 131/73 | HR 92 | Temp 98.4°F | Ht 69.0 in | Wt 99.2 lb

## 2018-06-05 DIAGNOSIS — Z791 Long term (current) use of non-steroidal anti-inflammatories (NSAID): Secondary | ICD-10-CM | POA: Diagnosis not present

## 2018-06-05 DIAGNOSIS — Z803 Family history of malignant neoplasm of breast: Secondary | ICD-10-CM | POA: Insufficient documentation

## 2018-06-05 DIAGNOSIS — Z8041 Family history of malignant neoplasm of ovary: Secondary | ICD-10-CM | POA: Insufficient documentation

## 2018-06-05 DIAGNOSIS — F419 Anxiety disorder, unspecified: Secondary | ICD-10-CM | POA: Insufficient documentation

## 2018-06-05 DIAGNOSIS — Z79899 Other long term (current) drug therapy: Secondary | ICD-10-CM | POA: Diagnosis not present

## 2018-06-05 DIAGNOSIS — Z882 Allergy status to sulfonamides status: Secondary | ICD-10-CM | POA: Diagnosis not present

## 2018-06-05 DIAGNOSIS — Z7982 Long term (current) use of aspirin: Secondary | ICD-10-CM | POA: Insufficient documentation

## 2018-06-05 DIAGNOSIS — E1169 Type 2 diabetes mellitus with other specified complication: Secondary | ICD-10-CM

## 2018-06-05 DIAGNOSIS — F329 Major depressive disorder, single episode, unspecified: Secondary | ICD-10-CM | POA: Diagnosis not present

## 2018-06-05 DIAGNOSIS — I517 Cardiomegaly: Secondary | ICD-10-CM | POA: Diagnosis not present

## 2018-06-05 DIAGNOSIS — E785 Hyperlipidemia, unspecified: Secondary | ICD-10-CM | POA: Diagnosis not present

## 2018-06-05 DIAGNOSIS — E1165 Type 2 diabetes mellitus with hyperglycemia: Secondary | ICD-10-CM | POA: Diagnosis present

## 2018-06-05 DIAGNOSIS — M810 Age-related osteoporosis without current pathological fracture: Secondary | ICD-10-CM

## 2018-06-05 DIAGNOSIS — R64 Cachexia: Secondary | ICD-10-CM | POA: Diagnosis not present

## 2018-06-05 LAB — GLUCOSE, POCT (MANUAL RESULT ENTRY): POC GLUCOSE: 191 mg/dL — AB (ref 70–99)

## 2018-06-05 LAB — POCT GLYCOSYLATED HEMOGLOBIN (HGB A1C): HEMOGLOBIN A1C: 7.8 % — AB (ref 4.0–5.6)

## 2018-06-05 MED ORDER — GLUCOSE BLOOD VI STRP: ORAL_STRIP | 12 refills | Status: DC

## 2018-06-05 MED ORDER — TRUEPLUS LANCETS 28G MISC: 3 refills | Status: DC

## 2018-06-05 MED ORDER — ONETOUCH DELICA LANCETS 33G MISC: 12 refills | Status: DC

## 2018-06-05 MED ORDER — ATORVASTATIN CALCIUM 40 MG PO TABS: 40.0000 mg | ORAL_TABLET | Freq: Every day | ORAL | 1 refills | Status: DC

## 2018-06-05 MED ORDER — ALENDRONATE SODIUM 70 MG PO TABS: 70.0000 mg | ORAL_TABLET | ORAL | 3 refills | Status: DC

## 2018-06-05 MED ORDER — DULAGLUTIDE 1.5 MG/0.5ML ~~LOC~~ SOAJ: 1.5000 mg | SUBCUTANEOUS | 12 refills | Status: DC

## 2018-06-05 MED ORDER — ONETOUCH VERIO W/DEVICE KIT: PACK | 0 refills | Status: DC

## 2018-06-05 MED ORDER — MIRTAZAPINE 30 MG PO TABS: 15.0000 mg | ORAL_TABLET | Freq: Every day | ORAL | 1 refills | Status: DC

## 2018-06-05 MED ORDER — MIRTAZAPINE 15 MG PO TABS: 15.0000 mg | ORAL_TABLET | Freq: Every day | ORAL | 1 refills | Status: DC

## 2018-06-05 MED FILL — TRULICITY 1.5 MG/0.5 ML PEN: 1.5 | 30 days supply | Qty: 2 | Fill #0

## 2018-06-05 MED FILL — ALENDRONATE NA 70 MG TAB: 70 | 28 days supply | Qty: 4 | Fill #0

## 2018-06-05 MED FILL — ONETOUCH VERIO METER SYSTEM: W/DEVICE | 30 days supply | Qty: 1 | Fill #0

## 2018-06-05 MED FILL — ATORVASTATIN CALCIUM 40 MG: 40 | 90 days supply | Qty: 90 | Fill #0

## 2018-06-05 MED FILL — MIRTAZAPINE 30 MG TABLET: 30 | 30 days supply | Qty: 15 | Fill #0

## 2018-06-05 MED FILL — ONE TOUCH VERIO TEST STRIP: 50 days supply | Qty: 100 | Fill #0

## 2018-06-05 NOTE — Progress Notes (Signed)
Assessment & Plan:  Korrin was seen today for follow-up.  Diagnoses and all orders for this visit:  Type 2 diabetes mellitus with hyperglycemia, without long-term current use of insulin (HCC) -     Glucose (CBG) -     HgB A1c -     atorvastatin (LIPITOR) 40 MG tablet; Take 1 tablet (40 mg total) by mouth daily. -     CBC -     Microalbumin / creatinine urine ratio -     Dulaglutide (TRULICITY) 1.5 IW/9.7LG SOPN; Inject 1.5 mg into the skin once a week. Continue blood sugar control as discussed in office today, low carbohydrate diet, and regular physical exercise as tolerated, 150 minutes per week (30 min each day, 5 days per week, or 50 min 3 days per week). Keep blood sugar logs with fasting goal of 90-130 mg/dl, post prandial (after you eat) less than 180.  For Hypoglycemia: BS <60 and Hyperglycemia BS >400; contact the clinic ASAP. Annual eye exams and foot exams are recommended.   Cachectic (HCC) -     Discontinue: mirtazapine (REMERON) 15 MG tablet; Take 1 tablet (15 mg total) by mouth at bedtime. -     mirtazapine (REMERON) 30 MG tablet; Take 0.5 tablets (15 mg total) by mouth at bedtime.  Anxiety and depression -     mirtazapine (REMERON) 30 MG tablet; Take 0.5 tablets (15 mg total) by mouth at bedtime.  Age-related osteoporosis without current pathological fracture -     alendronate (FOSAMAX) 70 MG tablet; Take 1 tablet (70 mg total) by mouth once a week. Take with a full glass of water on an empty stomach. Will need repeat BDT   Atrial enlargement, left -     ECHOCARDIOGRAM COMPLETE; Future Will refer to cardiology based on ECHO results  Patient has been counseled on age-appropriate routine health concerns for screening and prevention. These are reviewed and up-to-date. Referrals have been placed accordingly. Immunizations are up-to-date or declined.    Subjective:   Chief Complaint  Patient presents with  . Follow-up    Pt. is here for diabetes follow-up. Pt.  stated she been feeling anxious lately.  Pt. stated she can't seem to eat meal but able to snack easy.    HPI GLENYCE RANDLE 68 y.o. female presents to office today for follow up to DM. She also has complaints of increased agitation and anxiety. I had started her on remeron several months ago however she states she ran out and did not request a refill. Will restart today at 72m nightly.   Diabetes Mellitus Type 2 Improved. A1c down from 8.4 to 7.8 today. Checking glucose levels at home at least 3 times a day "when I'm in my right mind". Morning, Noon, Night. Fasting level this morning 191. She has been drinking ensure lately to help increase her weight. Appetite has been poor for over a year. She is aware she should be drinking glucerna but states she was given the ensure for free so she will finish it out. She denies any hypo or hyperglycemic symptoms. Current medications include Trulicity which may have to be discontinued if she continues to lose weight. She is still smoking. Declines PNA vaccine today.  Lab Results  Component Value Date   HGBA1C 7.8 (A) 06/05/2018   Depression Will increase remeron. May need adjunct. Will re evaulate in 3 weeks. She denies any thoughts of self harm.  GAD 7 : Generalized Anxiety Score 06/05/2018 03/04/2018 12/02/2017 10/01/2017  Nervous,  Anxious, on Edge '1 2 2 1  ' Control/stop worrying '1 3 2 3  ' Worry too much - different things '1 3 2 3  ' Trouble relaxing '3 3 2 1  ' Restless '3 2 2 1  ' Easily annoyed or irritable '3 2 3 1  ' Afraid - awful might happen '1 3 3 1  ' Total GAD 7 Score '13 18 16 11     ' Depression screen [facility] 2/9 06/05/2018 03/04/2018 12/02/2017 10/01/2017 08/20/2017  Decreased Interest '1 2 1 1 3  ' Down, Depressed, Hopeless '2 2 1 2 3  ' PHQ - 2 Score '3 4 2 3 6  ' Altered sleeping 2 2 0 3 3  Tired, decreased energy '1 2 1 3 3  ' Change in appetite '3 2 1 3 3  ' Feeling bad or failure about yourself  '1 2 3 2 3  ' Trouble concentrating '1 2 3 1 3  ' Moving slowly or  fidgety/restless 2 2 0 1 3  Suicidal thoughts '3 2 1 1 3  ' PHQ-9 Score '16 18 11 17 27  ' Some recent data might be hidden   Review of Systems  Constitutional: Positive for weight loss. Negative for fever and malaise/fatigue.  HENT: Negative.  Negative for nosebleeds.   Eyes: Negative.  Negative for blurred vision, double vision and photophobia.  Respiratory: Negative.  Negative for cough and shortness of breath.   Cardiovascular: Negative.  Negative for chest pain, palpitations and leg swelling.  Gastrointestinal: Negative.  Negative for heartburn, nausea and vomiting.  Musculoskeletal: Negative for myalgias.  Neurological: Negative.  Negative for dizziness, focal weakness, seizures and headaches.  Psychiatric/Behavioral: Positive for depression. Negative for suicidal ideas. The patient is nervous/anxious and has insomnia.     Past Medical History:  Diagnosis Date  . Diabetes mellitus without complication (HCC) 20 years ago  . Hyperlipidemia   . Osteoporosis     Past Surgical History:  Procedure Laterality Date  . TUBAL LIGATION      Family History  Problem Relation Age of Onset  . Breast cancer Sister   . Ovarian cancer Cousin     Social History Reviewed with no changes to be made today.   Outpatient Medications Prior to Visit  Medication Sig Dispense Refill  . aspirin EC 81 MG tablet Take 1 tablet (81 mg total) by mouth daily. 30 tablet 11  . ondansetron (ZOFRAN ODT) 4 MG disintegrating tablet Take 1 tablet (4 mg total) by mouth every 8 (eight) hours as needed for nausea or vomiting. 30 tablet 1  . ACCU-CHEK SOFTCLIX LANCETS lancets 1 each by Other route 3 (three) times daily. E11.9 100 each 11  . alendronate (FOSAMAX) 70 MG tablet Take 1 tablet (70 mg total) by mouth once a week. Take with a full glass of water on an empty stomach. 12 tablet 3  . atorvastatin (LIPITOR) 40 MG tablet TAKE 1 TABLET BY MOUTH DAILY. 30 tablet 2  . Blood Glucose Monitoring Suppl (ACCU-CHEK AVIVA  PLUS) w/Device KIT USE AS DIRECTED BY PHYSICIAN 1 kit 0  . Dulaglutide (TRULICITY) 1.5 WT/2.1CC SOPN Inject 1.5 mg into the skin once a week. 2 mL 2  . glucose blood (ONE TOUCH ULTRA TEST) test strip Use as instructed 100 each 12  . Lancet Devices (ACCU-CHEK SOFTCLIX) lancets 1 each by Other route 3 (three) times daily. E11.9 1 each 0  . Naproxen Sodium (ALEVE PO) Take by mouth.    . Calcium Citrate 200 MG TABS Take 2 tablets (400 mg total) by mouth daily. (  Patient not taking: Reported on 05/27/2018) 60 tablet 11  . Cholecalciferol (VITAMIN D3) 2000 units TABS Take 2,000 Units by mouth daily. (Patient not taking: Reported on 08/20/2017) 30 tablet 11  . meloxicam (MOBIC) 7.5 MG tablet Take 1 tablet (7.5 mg total) by mouth daily. (Patient not taking: Reported on 06/05/2018) 30 tablet 0  . aspirin-sod bicarb-citric acid (ALKA-SELTZER) 325 MG TBEF tablet Take 325 mg by mouth every 6 (six) hours as needed (indigestion).    . mirtazapine (REMERON) 15 MG tablet Take 1 tablet (15 mg total) by mouth at bedtime. 30 tablet 1  . traMADol (ULTRAM) 50 MG tablet Take 1 tablet (50 mg total) by mouth every 6 (six) hours as needed. (Patient not taking: Reported on 12/02/2017) 8 tablet 0   No facility-administered medications prior to visit.     Allergies  Allergen Reactions  . Sulfa Antibiotics Hives    Has patient had a PCN reaction causing immediate rash, facial/tongue/throat swelling, SOB or lightheadedness with hypotension: Unknown Has patient had a PCN reaction causing severe rash involving mucus membranes or skin necrosis: Unknown Has patient had a PCN reaction that required hospitalization: Unknown Has patient had a PCN reaction occurring within the last 10 years: Unknown If all of the above answers are "NO", then may proceed with Cephalosporin use.   . Sulfonamide Derivatives        Objective:    BP 131/73 (BP Location: Left Arm, Patient Position: Sitting, Cuff Size: Small)   Pulse 92   Temp 98.4 F  (36.9 C) (Oral)   Ht '5\' 9"'  (1.753 m)   Wt 99 lb 3.2 oz (45 kg)   SpO2 97%   BMI 14.65 kg/m  Wt Readings from Last 3 Encounters:  06/05/18 99 lb 3.2 oz (45 kg)  05/27/18 100 lb 9.6 oz (45.6 kg)  03/04/18 109 lb 3.2 oz (49.5 kg)    Physical Exam Vitals signs and nursing note reviewed.  Constitutional:      Appearance: She is cachectic.  HENT:     Head: Normocephalic and atraumatic.  Neck:     Musculoskeletal: Normal range of motion.  Cardiovascular:     Rate and Rhythm: Normal rate and regular rhythm.     Heart sounds: Normal heart sounds. No murmur. No friction rub. No gallop.   Pulmonary:     Effort: Pulmonary effort is normal. No tachypnea or respiratory distress.     Breath sounds: Normal breath sounds. No decreased breath sounds, wheezing, rhonchi or rales.  Chest:     Chest wall: No tenderness.  Abdominal:     General: Bowel sounds are normal.     Palpations: Abdomen is soft.  Musculoskeletal: Normal range of motion.  Skin:    General: Skin is warm and dry.  Neurological:     Mental Status: She is alert and oriented to person, place, and time.     Coordination: Coordination normal.  Psychiatric:        Behavior: Behavior normal. Behavior is cooperative.        Thought Content: Thought content normal.        Judgment: Judgment normal.          Patient has been counseled extensively about nutrition and exercise as well as the importance of adherence with medications and regular follow-up. The patient was given clear instructions to go to ER or return to medical center if symptoms don't improve, worsen or new problems develop. The patient verbalized understanding.   Follow-up: Return in  about 3 weeks (around 06/26/2018) for remeron .   [name], FNP-BC [facility] [place], [place]   06/05/2018, 2:15 PM

## 2018-06-06 LAB — CBC
Hematocrit: 35.3 % (ref 34.0–46.6)
Hemoglobin: 12 g/dL (ref 11.1–15.9)
MCH: 32.6 pg (ref 26.6–33.0)
MCHC: 34 g/dL (ref 31.5–35.7)
MCV: 96 fL (ref 79–97)
Platelets: 308 10*3/uL (ref 150–450)
RBC: 3.68 x10E6/uL — ABNORMAL LOW (ref 3.77–5.28)
RDW: 12.1 % (ref 11.7–15.4)
WBC: 10.8 10*3/uL (ref 3.4–10.8)

## 2018-06-06 LAB — MICROALBUMIN / CREATININE URINE RATIO
Creatinine, Urine: 121.3 mg/dL
Microalb/Creat Ratio: 8 mg/g creat (ref 0–29)
Microalbumin, Urine: 9.6 ug/mL

## 2018-06-09 ENCOUNTER — Ambulatory Visit: Payer: Medicare Other | Attending: Family Medicine | Admitting: Pharmacist

## 2018-06-09 ENCOUNTER — Encounter: Payer: Self-pay | Admitting: Pharmacist

## 2018-06-09 ENCOUNTER — Telehealth: Payer: Self-pay

## 2018-06-09 DIAGNOSIS — Z79899 Other long term (current) drug therapy: Secondary | ICD-10-CM

## 2018-06-09 NOTE — Progress Notes (Signed)
Patient was educated on the use of the OneTouch Verio blood glucose meter. Reviewed necessary supplies and operation of the meter. Also reviewed goal blood glucose levels. Patient was able to demonstrate use. All questions and concerns were addressed.  

## 2018-06-09 NOTE — Telephone Encounter (Signed)
-----   Message from Claiborne Rigg, NP sent at 06/07/2018 12:55 AM EST ----- Please inform patient that laboratory results are normal. Continue healthy eating habits and glucerna.

## 2018-06-09 NOTE — Telephone Encounter (Signed)
CMA spoke to patient to inform on results and PCP advising.  Pt. Understood. Pt. Verified DOB.  

## 2018-06-15 ENCOUNTER — Ambulatory Visit (HOSPITAL_COMMUNITY): Payer: Medicare Other

## 2018-06-18 ENCOUNTER — Other Ambulatory Visit (HOSPITAL_COMMUNITY): Payer: Medicare Other

## 2018-06-30 ENCOUNTER — Telehealth: Payer: Self-pay | Admitting: Nurse Practitioner

## 2018-06-30 NOTE — Telephone Encounter (Signed)
Pt came in asked if nurse could call back to follow up pt did not disclose reason  Please follow up

## 2018-06-30 NOTE — Telephone Encounter (Signed)
Patient wanted CMA to reschedule her Echocardiogram due to patient missed her previous appt.  CMA was able schedule her echocardiogram and also informed patient the date and time.

## 2018-07-02 ENCOUNTER — Other Ambulatory Visit: Payer: Self-pay

## 2018-07-02 ENCOUNTER — Ambulatory Visit (HOSPITAL_COMMUNITY)
Admission: RE | Admit: 2018-07-02 | Discharge: 2018-07-02 | Disposition: A | Discharge status: Home / Self Care | Payer: Medicare Other | Source: Ambulatory Visit | Attending: Nurse Practitioner | Admitting: Nurse Practitioner

## 2018-07-02 DIAGNOSIS — E785 Hyperlipidemia, unspecified: Secondary | ICD-10-CM | POA: Insufficient documentation

## 2018-07-02 DIAGNOSIS — I517 Cardiomegaly: Secondary | ICD-10-CM | POA: Insufficient documentation

## 2018-07-02 DIAGNOSIS — E119 Type 2 diabetes mellitus without complications: Secondary | ICD-10-CM | POA: Diagnosis not present

## 2018-07-02 NOTE — Progress Notes (Signed)
  Echocardiogram 2D Echocardiogram has been performed.  Ana Hines [name] 07/02/2018, 3:40 PM

## 2018-07-03 NOTE — Telephone Encounter (Signed)
-----   Message from Claiborne Rigg, NP sent at 07/02/2018 11:21 PM EDT ----- We will continue to monitor for any signs and symptoms of heart failure and refer to cardiology as needed

## 2018-07-03 NOTE — Telephone Encounter (Signed)
CMA spoke to patient to inform on echocardiogram results.  Pt. Verified DOB. Pt. Understood.

## 2018-07-08 ENCOUNTER — Ambulatory Visit: Payer: Medicare Other | Attending: Nurse Practitioner | Admitting: Nurse Practitioner

## 2018-07-08 ENCOUNTER — Other Ambulatory Visit: Payer: Self-pay

## 2018-07-08 ENCOUNTER — Encounter: Payer: Self-pay | Admitting: Nurse Practitioner

## 2018-07-08 VITALS — BP 144/89 | HR 81 | Temp 98.2°F | Ht 69.0 in | Wt 100.6 lb

## 2018-07-08 DIAGNOSIS — F17210 Nicotine dependence, cigarettes, uncomplicated: Secondary | ICD-10-CM | POA: Diagnosis not present

## 2018-07-08 DIAGNOSIS — F419 Anxiety disorder, unspecified: Secondary | ICD-10-CM | POA: Diagnosis not present

## 2018-07-08 DIAGNOSIS — M810 Age-related osteoporosis without current pathological fracture: Secondary | ICD-10-CM

## 2018-07-08 DIAGNOSIS — E1165 Type 2 diabetes mellitus with hyperglycemia: Secondary | ICD-10-CM | POA: Diagnosis not present

## 2018-07-08 DIAGNOSIS — F329 Major depressive disorder, single episode, unspecified: Secondary | ICD-10-CM

## 2018-07-08 DIAGNOSIS — F17200 Nicotine dependence, unspecified, uncomplicated: Secondary | ICD-10-CM

## 2018-07-08 DIAGNOSIS — Z23 Encounter for immunization: Secondary | ICD-10-CM | POA: Diagnosis not present

## 2018-07-08 LAB — GLUCOSE, POCT (MANUAL RESULT ENTRY): POC Glucose: 270 mg/dl — AB (ref 70–99)

## 2018-07-08 MED ORDER — GLUCOSE BLOOD VI STRP: ORAL_STRIP | 12 refills | Status: DC

## 2018-07-08 MED ORDER — DULAGLUTIDE 1.5 MG/0.5ML ~~LOC~~ SOAJ: 1.5000 mg | SUBCUTANEOUS | 12 refills | Status: AC

## 2018-07-08 MED ORDER — ALENDRONATE SODIUM 70 MG PO TABS: 70.0000 mg | ORAL_TABLET | ORAL | 3 refills | Status: DC

## 2018-07-08 MED FILL — ALENDRONATE NA 70 MG TAB: 70 | 28 days supply | Qty: 4 | Fill #0

## 2018-07-08 MED FILL — ONE TOUCH VERIO TEST STRIP: 50 days supply | Qty: 100 | Fill #0

## 2018-07-08 NOTE — Progress Notes (Signed)
Assessment & Plan:  Ana Hines was seen today for follow-up.  Diagnoses and all orders for this visit:  Anxiety and depression Continue remeron as prescribed.   Age-related osteoporosis without current pathological fracture -     alendronate (FOSAMAX) 70 MG tablet; Take 1 tablet (70 mg total) by mouth once a week. Take with a full glass of water on an empty stomach.  Tobacco dependence Ana Hines was counseled on the dangers of tobacco use, and was advised to quit. Reviewed strategies to maximize success, including removing cigarettes and smoking materials from environment, stress management and support of family/friends as well as pharmacological alternatives including: Wellbutrin, Chantix, Nicotine patch, Nicotine gum or lozenges. Smoking cessation support: smoking cessation hotline: 1-800-QUIT-NOW.  Smoking cessation classes are also available through [facility]. Call [phone] or visit our website at [email or web address]   A total of 4 minutes was spent on counseling for smoking cessation and Ana Hines is ready to quit and states she has started cutting back  Type 2 diabetes mellitus with hyperglycemia, without long-term current use of insulin (HCC) -     Glucose (CBG) -     glucose blood (ONETOUCH VERIO) test strip; Use as directed at least twice per day -     Dulaglutide (TRULICITY) 1.5 EH/6.3JS SOPN; Inject 1.5 mg into the skin once a week. Continue medications as prescribed.  Continue blood sugar control as discussed in office today, low carbohydrate diet, and regular physical exercise as tolerated, 150 minutes per week (30 min each day, 5 days per week, or 50 min 3 days per week). Keep blood sugar logs with fasting goal of 90-130 mg/dl, post prandial (after you eat) less than 180.  For Hypoglycemia: BS <60 and Hyperglycemia BS >400; contact the clinic ASAP. Annual eye exams and foot exams are recommended. Lab Results  Component Value Date   HGBA1C 7.8 (A)  06/05/2018     Immunization due -     Pneumococcal polysaccharide vaccine 23-valent greater than or equal to 67yo subcutaneous/IM    Patient has been counseled on age-appropriate routine health concerns for screening and prevention. These are reviewed and up-to-date. Referrals have been placed accordingly. Immunizations are up-to-date or declined.    Subjective:   Chief Complaint  Patient presents with  . Follow-up    Pt. is here to F/U on Remeron. Pt. stated the medication helps her sleep well at night.    HPI  Ana Hines 68 y.o. female presents to office today for follow up to insomnia, anxiety, depression and cachexia. She was started on Remeron last month. Today she states she is sleeping better and has even reduced the amount of cigarettes she smokes. PHQ9 and GAD have also improved. She has not noticed any improvement in her appetite however.  Depression screen Ana Hines 2/9 07/08/2018 06/05/2018 03/04/2018 12/02/2017 10/01/2017  Decreased Interest _0 Down, Depressed, Hopeless _1 PHQ - 2 Score _2 Altered sleeping 0 2 2 0 3  Tired, decreased energy _3 Change in appetite _4 Feeling bad or failure about yourself  _5 Trouble concentrating _6 Moving slowly or fidgety/restless 0 2 2 0 1  Suicidal thoughts _7 PHQ-9 Score _8 Some recent data might be hidden   GAD 7 :  Generalized Anxiety Score 07/08/2018 06/05/2018 03/04/2018 12/02/2017  Nervous, Anxious, on Edge _0 Control/stop worrying _1 Worry too much - different things _2 Trouble relaxing _3 Restless _4 Easily annoyed or irritable _5 Afraid - awful might happen _6 Total GAD 7 Score _7 Review of Systems  Constitutional: Negative for fever, malaise/fatigue and weight loss.  HENT: Negative.  Negative for nosebleeds.   Eyes: Negative.  Negative for blurred vision, double vision and photophobia.   Respiratory: Negative.  Negative for cough and shortness of breath.   Cardiovascular: Negative.  Negative for chest pain, palpitations and leg swelling.  Gastrointestinal: Negative.  Negative for heartburn, nausea and vomiting.  Musculoskeletal: Negative.  Negative for myalgias.  Neurological: Negative.  Negative for dizziness, focal weakness, seizures and headaches.  Psychiatric/Behavioral: Negative.  Negative for suicidal ideas.    Past Medical History:  Diagnosis Date  . Diabetes mellitus without complication (HCC) 20 years ago  . Hyperlipidemia   . Osteoporosis     Past Surgical History:  Procedure Laterality Date  . TUBAL LIGATION      Family History  Problem Relation Age of Onset  . Breast cancer Sister   . Ovarian cancer Cousin     Social History Reviewed with no changes to be made today.   Outpatient Medications Prior to Visit  Medication Sig Dispense Refill  . aspirin EC 81 MG tablet Take 1 tablet (81 mg total) by mouth daily. 30 tablet 11  . atorvastatin (LIPITOR) 40 MG tablet Take 1 tablet (40 mg total) by mouth daily. 90 tablet 1  . Blood Glucose Monitoring Suppl (ONETOUCH VERIO) w/Device KIT Use as directed at least twice per day 1 kit 0  . mirtazapine (REMERON) 30 MG tablet Take 0.5 tablets (15 mg total) by mouth at bedtime. 90 tablet 1  . ondansetron (ZOFRAN ODT) 4 MG disintegrating tablet Take 1 tablet (4 mg total) by mouth every 8 (eight) hours as needed for nausea or vomiting. 30 tablet 1  . ONETOUCH DELICA LANCETS 58I MISC Use as directed at least twice per day 100 each 12  . alendronate (FOSAMAX) 70 MG tablet Take 1 tablet (70 mg total) by mouth once a week. Take with a full glass of water on an empty stomach. 12 tablet 3  . Dulaglutide (TRULICITY) 1.5 TG/5.4DI SOPN Inject 1.5 mg into the skin once a week. 2 mL 12  . glucose blood (ONETOUCH VERIO) test strip Use as directed at least twice per day 100 each 12  . Calcium Citrate 200 MG TABS Take 2 tablets  (400 mg total) by mouth daily. (Patient not taking: Reported on 05/27/2018) 60 tablet 11  . Cholecalciferol (VITAMIN D3) 2000 units TABS Take 2,000 Units by mouth daily. (Patient not taking: Reported on 08/20/2017) 30 tablet 11  . meloxicam (MOBIC) 7.5 MG tablet Take 1 tablet (7.5 mg total) by mouth daily. (Patient not taking: Reported on 06/05/2018) 30 tablet 0   No facility-administered medications prior to visit.     Allergies  Allergen Reactions  . Sulfa Antibiotics Hives    Has patient had a PCN reaction causing immediate rash, facial/tongue/throat swelling, SOB or lightheadedness with hypotension: Unknown Has patient had a PCN reaction causing severe rash involving mucus membranes or skin necrosis: Unknown Has patient had a PCN reaction that  required hospitalization: Unknown Has patient had a PCN reaction occurring within the last 10 years: Unknown If all of the above answers are "NO", then may proceed with Cephalosporin use.   . Sulfonamide Derivatives        Objective:    BP (!) 144/89 (BP Location: Left Arm, Patient Position: Sitting, Cuff Size: Small)   Pulse 81   Temp 98.2 F (36.8 C) (Oral)   Ht _0  (1.753 m)   Wt 100 lb 9.6 oz (45.6 kg)   SpO2 99%   BMI 14.86 kg/m  Wt Readings from Last 3 Encounters:  07/08/18 100 lb 9.6 oz (45.6 kg)  06/05/18 99 lb 3.2 oz (45 kg)  05/27/18 100 lb 9.6 oz (45.6 kg)    Physical Exam Vitals signs and nursing note reviewed.  Constitutional:      Appearance: She is well-developed.  HENT:     Head: Normocephalic and atraumatic.  Neck:     Musculoskeletal: Normal range of motion.  Cardiovascular:     Rate and Rhythm: Normal rate and regular rhythm.     Heart sounds: Normal heart sounds. No murmur. No friction rub. No gallop.   Pulmonary:     Effort: Pulmonary effort is normal. No tachypnea or respiratory distress.     Breath sounds: Normal breath sounds. No decreased breath sounds, wheezing, rhonchi or rales.  Chest:     Chest  wall: No tenderness.  Abdominal:     General: Bowel sounds are normal.     Palpations: Abdomen is soft.  Musculoskeletal: Normal range of motion.  Skin:    General: Skin is warm and dry.  Neurological:     Mental Status: She is alert and oriented to person, place, and time.     Coordination: Coordination normal.  Psychiatric:        Behavior: Behavior normal. Behavior is cooperative.        Thought Content: Thought content normal.        Judgment: Judgment normal.          Patient has been counseled extensively about nutrition and exercise as well as the importance of adherence with medications and regular follow-up. The patient was given clear instructions to go to ER or return to medical center if symptoms don't improve, worsen or new problems develop. The patient verbalized understanding.   Follow-up: No follow-ups on file.   [name], FNP-BC [facility] [place], [place]   07/08/2018, 10:15 AM

## 2018-07-20 ENCOUNTER — Other Ambulatory Visit: Payer: Self-pay

## 2018-07-20 NOTE — Patient Outreach (Signed)
[facility] Customer service manager [facility]) Care Management  07/20/2018  [name] 05/10/1950 [id number]   Medication Adherence call to Mrs. [name] spoke with patient she is due on Trulicity inj. Patient explain she does not have a problem taking her medication or picking it up. I explain to the patient she may qualify for patient's assistance she refuse she said she only pays $3.30 and she will continued doing so. Mrs. Tonn is showing past due under [facility] [place] [employer].   Lillia Abed CPhT Pharmacy Technician [facility] Care Management Direct Dial [phone]  Fax [phone] [email or web address]

## 2018-08-06 MED FILL — ALENDRONATE NA 70 MG TAB: 70 | 28 days supply | Qty: 4 | Fill #1

## 2018-08-06 MED FILL — TRULICITY 1.5 MG/0.5 ML PEN: 1.5 | 28 days supply | Qty: 2 | Fill #0

## 2018-08-06 MED FILL — MIRTAZAPINE 30 MG TABLET: 30 | 30 days supply | Qty: 15 | Fill #1

## 2018-08-19 ENCOUNTER — Ambulatory Visit (INDEPENDENT_AMBULATORY_CARE_PROVIDER_SITE_OTHER): Payer: Medicare Other | Admitting: Podiatry

## 2018-08-19 ENCOUNTER — Other Ambulatory Visit: Payer: Self-pay

## 2018-08-19 ENCOUNTER — Encounter: Payer: Self-pay | Admitting: Podiatry

## 2018-08-19 VITALS — Temp 97.5°F

## 2018-08-19 DIAGNOSIS — E119 Type 2 diabetes mellitus without complications: Secondary | ICD-10-CM | POA: Diagnosis not present

## 2018-08-19 DIAGNOSIS — M79675 Pain in left toe(s): Secondary | ICD-10-CM

## 2018-08-19 DIAGNOSIS — M79674 Pain in right toe(s): Secondary | ICD-10-CM

## 2018-08-19 DIAGNOSIS — B351 Tinea unguium: Secondary | ICD-10-CM

## 2018-08-19 NOTE — Patient Instructions (Signed)
Diabetes Mellitus and Foot Care Foot care is an important part of your health, especially when you have diabetes. Diabetes may cause you to have problems because of poor blood flow (circulation) to your feet and legs, which can cause your skin to:  Become thinner and drier.  Break more easily.  Heal more slowly.  Peel and crack. You may also have nerve damage (neuropathy) in your legs and feet, causing decreased feeling in them. This means that you may not notice minor injuries to your feet that could lead to more serious problems. Noticing and addressing any potential problems early is the best way to prevent future foot problems. How to care for your feet Foot hygiene  Wash your feet daily with warm water and mild soap. Do not use hot water. Then, pat your feet and the areas between your toes until they are completely dry. Do not soak your feet as this can dry your skin.  Trim your toenails straight across. Do not dig under them or around the cuticle. File the edges of your nails with an emery board or nail file.  Apply a moisturizing lotion or petroleum jelly to the skin on your feet and to dry, brittle toenails. Use lotion that does not contain alcohol and is unscented. Do not apply lotion between your toes. Shoes and socks  Wear clean socks or stockings every day. Make sure they are not too tight. Do not wear knee-high stockings since they may decrease blood flow to your legs.  Wear shoes that fit properly and have enough cushioning. Always look in your shoes before you put them on to be sure there are no objects inside.  To break in new shoes, wear them for just a few hours a day. This prevents injuries on your feet. Wounds, scrapes, corns, and calluses  Check your feet daily for blisters, cuts, bruises, sores, and redness. If you cannot see the bottom of your feet, use a mirror or ask someone for help.  Do not cut corns or calluses or try to remove them with medicine.  If you  find a minor scrape, cut, or break in the skin on your feet, keep it and the skin around it clean and dry. You may clean these areas with mild soap and water. Do not clean the area with peroxide, alcohol, or iodine.  If you have a wound, scrape, corn, or callus on your foot, look at it several times a day to make sure it is healing and not infected. Check for: ? Redness, swelling, or pain. ? Fluid or blood. ? Warmth. ? Pus or a bad smell. General instructions  Do not cross your legs. This may decrease blood flow to your feet.  Do not use heating pads or hot water bottles on your feet. They may burn your skin. If you have lost feeling in your feet or legs, you may not know this is happening until it is too late.  Protect your feet from hot and cold by wearing shoes, such as at the beach or on hot pavement.  Schedule a complete foot exam at least once a year (annually) or more often if you have foot problems. If you have foot problems, report any cuts, sores, or bruises to your health care provider immediately. Contact a health care provider if:  You have a medical condition that increases your risk of infection and you have any cuts, sores, or bruises on your feet.  You have an injury that is not   healing.  You have redness on your legs or feet.  You feel burning or tingling in your legs or feet.  You have pain or cramps in your legs and feet.  Your legs or feet are numb.  Your feet always feel cold.  You have pain around a toenail. Get help right away if:  You have a wound, scrape, corn, or callus on your foot and: ? You have pain, swelling, or redness that gets worse. ? You have fluid or blood coming from the wound, scrape, corn, or callus. ? Your wound, scrape, corn, or callus feels warm to the touch. ? You have pus or a bad smell coming from the wound, scrape, corn, or callus. ? You have a fever. ? You have a red line going up your leg. Summary  Check your feet every day  for cuts, sores, red spots, swelling, and blisters.  Moisturize feet and legs daily.  Wear shoes that fit properly and have enough cushioning.  If you have foot problems, report any cuts, sores, or bruises to your health care provider immediately.  Schedule a complete foot exam at least once a year (annually) or more often if you have foot problems. This information is not intended to replace advice given to you by your health care provider. Make sure you discuss any questions you have with your health care provider. Document Released: 04/05/2000 Document Revised: 05/21/2017 Document Reviewed: 05/10/2016 Elsevier Interactive Patient Education  2019 [employer] Inc.  Onychomycosis/Fungal Toenails  WHAT IS IT? An infection that lies within the keratin of your nail plate that is caused by a fungus.  WHY ME? Fungal infections affect all ages, sexes, races, and creeds.  There may be many factors that predispose you to a fungal infection such as age, coexisting medical conditions such as diabetes, or an autoimmune disease; stress, medications, fatigue, genetics, etc.  Bottom line: fungus thrives in a warm, moist environment and your shoes offer such a location.  IS IT CONTAGIOUS? Theoretically, yes.  You do not want to share shoes, nail clippers or files with someone who has fungal toenails.  Walking around barefoot in the same room or sleeping in the same bed is unlikely to transfer the organism.  It is important to realize, however, that fungus can spread easily from one nail to the next on the same foot.  HOW DO WE TREAT THIS?  There are several ways to treat this condition.  Treatment may depend on many factors such as age, medications, pregnancy, liver and kidney conditions, etc.  It is best to ask your doctor which options are available to you.  1. No treatment.   Unlike many other medical concerns, you can live with this condition.  However for many people this can be a painful condition and  may lead to ingrown toenails or a bacterial infection.  It is recommended that you keep the nails cut short to help reduce the amount of fungal nail. 2. Topical treatment.  These range from herbal remedies to prescription strength nail lacquers.  About 40-50% effective, topicals require twice daily application for approximately 9 to 12 months or until an entirely new nail has grown out.  The most effective topicals are medical grade medications available through physicians offices. 3. Oral antifungal medications.  With an 80-90% cure rate, the most common oral medication requires 3 to 4 months of therapy and stays in your system for a year as the new nail grows out.  Oral antifungal medications do require   blood work to make sure it is a safe drug for you.  A liver function panel will be performed prior to starting the medication and after the first month of treatment.  It is important to have the blood work performed to avoid any harmful side effects.  In general, this medication safe but blood work is required. 4. Laser Therapy.  This treatment is performed by applying a specialized laser to the affected nail plate.  This therapy is noninvasive, fast, and non-painful.  It is not covered by insurance and is therefore, out of pocket.  The results have been very good with a 80-95% cure rate.  The [facility] is the only practice in the area to offer this therapy. 5. Permanent Nail Avulsion.  Removing the entire nail so that a new nail will not grow back. 

## 2018-08-24 ENCOUNTER — Encounter: Payer: Self-pay | Admitting: Podiatry

## 2018-08-24 MED FILL — TRULICITY 1.5 MG/0.5 ML PEN: 1.5 | 28 days supply | Qty: 2 | Fill #1

## 2018-08-24 NOTE — Progress Notes (Signed)
Subjective: Ana Hines presents today for preventative foot care.  She has history of diabetes with painful, thick toenails 1-5 b/l that she cannot cut and which interfere with daily activities.  Pain is aggravated when wearing enclosed shoe gear and relieved with periodic professional debridement.Raul Del, Vernia Buff, NP is her PCP.  Last visit June 05, 2018.   Current Outpatient Medications:  .  alendronate (FOSAMAX) 70 MG tablet, Take 1 tablet (70 mg total) by mouth once a week. Take with a full glass of water on an empty stomach., Disp: 12 tablet, Rfl: 3 .  aspirin EC 81 MG tablet, Take 1 tablet (81 mg total) by mouth daily., Disp: 30 tablet, Rfl: 11 .  atorvastatin (LIPITOR) 40 MG tablet, Take 1 tablet (40 mg total) by mouth daily., Disp: 90 tablet, Rfl: 1 .  Blood Glucose Monitoring Suppl (ONETOUCH VERIO) w/Device KIT, Use as directed at least twice per day, Disp: 1 kit, Rfl: 0 .  Calcium Citrate 200 MG TABS, Take 2 tablets (400 mg total) by mouth daily., Disp: 60 tablet, Rfl: 11 .  Cholecalciferol (VITAMIN D3) 2000 units TABS, Take 2,000 Units by mouth daily., Disp: 30 tablet, Rfl: 11 .  Dulaglutide (TRULICITY) 1.5 OY/7.7AJ SOPN, Inject 1.5 mg into the skin once a week., Disp: 2 mL, Rfl: 12 .  glucose blood (ONETOUCH VERIO) test strip, Use as directed at least twice per day, Disp: 100 each, Rfl: 12 .  meloxicam (MOBIC) 7.5 MG tablet, Take 1 tablet (7.5 mg total) by mouth daily., Disp: 30 tablet, Rfl: 0 .  mirtazapine (REMERON) 30 MG tablet, Take 0.5 tablets (15 mg total) by mouth at bedtime., Disp: 90 tablet, Rfl: 1 .  ondansetron (ZOFRAN ODT) 4 MG disintegrating tablet, Take 1 tablet (4 mg total) by mouth every 8 (eight) hours as needed for nausea or vomiting., Disp: 30 tablet, Rfl: 1 .  ONETOUCH DELICA LANCETS 28N MISC, Use as directed at least twice per day, Disp: 100 each, Rfl: 12  Allergies  Allergen Reactions  . Sulfa Antibiotics Hives    Has patient had a PCN reaction  causing immediate rash, facial/tongue/throat swelling, SOB or lightheadedness with hypotension: Unknown Has patient had a PCN reaction causing severe rash involving mucus membranes or skin necrosis: Unknown Has patient had a PCN reaction that required hospitalization: Unknown Has patient had a PCN reaction occurring within the last 10 years: Unknown If all of the above answers are "NO", then may proceed with Cephalosporin use.   . Sulfonamide Derivatives     Objective: Oral temperature 97.5 F Vascular Examination: Capillary refill time immediate x 10 digits.  Dorsalis pedis and Posterior tibial pulses palpable b/l.  Digital hair absent x10 digits.  Skin temperature gradient WNL b/l  Dermatological Examination: Skin thin and atrophic bilaterally.  Toenails 1-5 b/l discolored, thick, dystrophic with subungual debris and pain with palpation to nailbeds due to thickness of nails.  Musculoskeletal: Muscle strength 5/5 to all LE muscle groups  No gross bony deformities b/l.  No pain, crepitus or joint limitation noted with ROM.   Neurological: Sensation intact with 10 gram monofilament.  Vibratory sensation intact.  Labs: Last A1c 7.8%  Assessment: Painful onychomycosis toenails 1-5 b/l   Plan: 1. Toenails 1-5 b/l were debrided in length and girth without iatrogenic bleeding. 2. Patient to continue soft, supportive shoe gear daily. 3. Patient to report any pedal injuries to medical professional immediately. 4. Follow up 3 months.  5. Patient/POA to call should there be  a concern in the interim.

## 2018-09-07 ENCOUNTER — Telehealth: Payer: Self-pay | Admitting: Nurse Practitioner

## 2018-09-07 NOTE — Telephone Encounter (Signed)
Agree with recommendations.  

## 2018-09-07 NOTE — Telephone Encounter (Signed)
Pt. Stated last night she ate oranges and went to the bathroom, noticed her face looked funny. Pt. Stated she feels the tightness under neck. Pt. Stated every time she eats something her cheeks swells and also sore.   CMA informed patient go emergency department or Urgent care to get evaluated.  Pt. Understood.

## 2018-09-07 NOTE — Telephone Encounter (Signed)
Patient called stating that when she eats something the left side of her face begins to swell up. Please follow up.  Advised to go to ED or UC.

## 2018-09-08 MED FILL — ONDANSETRON ODT 4 MG TABLET: 4 | 10 days supply | Qty: 30 | Fill #1

## 2018-09-08 MED FILL — ALENDRONATE NA 70 MG TAB: 70 | 28 days supply | Qty: 4 | Fill #2

## 2018-09-16 MED FILL — MIRTAZAPINE 30 MG TABLET: 30 | 30 days supply | Qty: 15 | Fill #2

## 2018-09-16 MED FILL — TRULICITY 1.5 MG/0.5 ML PEN: 1.5 | 28 days supply | Qty: 2 | Fill #2

## 2018-10-12 MED FILL — ALENDRONATE NA 70 MG TAB: 70 | 28 days supply | Qty: 4 | Fill #3

## 2018-10-19 MED FILL — ATORVASTATIN CALCIUM 40 MG: 40 | 90 days supply | Qty: 90 | Fill #1

## 2018-10-27 MED FILL — TRULICITY 1.5 MG/0.5 ML PEN: 1.5 | 28 days supply | Qty: 2 | Fill #3

## 2018-11-02 MED FILL — ALENDRONATE NA 70 MG TAB: 70 | 28 days supply | Qty: 4 | Fill #4

## 2018-11-02 MED FILL — MIRTAZAPINE 30 MG TABLET: 30 | 30 days supply | Qty: 15 | Fill #3

## 2018-11-10 MED FILL — MIRTAZAPINE 30 MG TABLET: 30 | 30 days supply | Qty: 15 | Fill #3

## 2018-11-10 MED FILL — ALENDRONATE NA 70 MG TAB: 70 | 28 days supply | Qty: 4 | Fill #4

## 2018-11-13 MED FILL — TRULICITY 1.5 MG/0.5 ML PEN: 1.5 | 28 days supply | Qty: 2 | Fill #4

## 2018-11-18 ENCOUNTER — Ambulatory Visit: Payer: Medicare Other | Admitting: Podiatry

## 2019-01-05 MED FILL — ALENDRONATE NA 70 MG TAB: 70 | 28 days supply | Qty: 4 | Fill #0

## 2019-01-18 MED FILL — ATORVASTATIN CALCIUM 40 MG: 40 | 30 days supply | Qty: 30 | Fill #1

## 2019-01-18 MED FILL — TRULICITY 1.5 MG/0.5 ML PEN: 1.5 | 28 days supply | Qty: 2 | Fill #0

## 2019-01-29 ENCOUNTER — Other Ambulatory Visit: Payer: Self-pay

## 2019-01-29 ENCOUNTER — Encounter: Payer: Self-pay | Admitting: Nurse Practitioner

## 2019-01-29 ENCOUNTER — Ambulatory Visit: Payer: Medicare Other | Attending: Nurse Practitioner | Admitting: Nurse Practitioner

## 2019-01-29 VITALS — BP 101/62 | HR 86 | Temp 98.2°F | Ht 69.0 in | Wt 98.7 lb

## 2019-01-29 DIAGNOSIS — R64 Cachexia: Secondary | ICD-10-CM

## 2019-01-29 DIAGNOSIS — Z1231 Encounter for screening mammogram for malignant neoplasm of breast: Secondary | ICD-10-CM

## 2019-01-29 DIAGNOSIS — F17200 Nicotine dependence, unspecified, uncomplicated: Secondary | ICD-10-CM

## 2019-01-29 DIAGNOSIS — F419 Anxiety disorder, unspecified: Secondary | ICD-10-CM

## 2019-01-29 DIAGNOSIS — F17210 Nicotine dependence, cigarettes, uncomplicated: Secondary | ICD-10-CM

## 2019-01-29 DIAGNOSIS — E1165 Type 2 diabetes mellitus with hyperglycemia: Secondary | ICD-10-CM

## 2019-01-29 DIAGNOSIS — F329 Major depressive disorder, single episode, unspecified: Secondary | ICD-10-CM

## 2019-01-29 LAB — GLUCOSE, POCT (MANUAL RESULT ENTRY): POC Glucose: 212 mg/dl — AB (ref 70–99)

## 2019-01-29 LAB — POCT GLYCOSYLATED HEMOGLOBIN (HGB A1C): Hemoglobin A1C: 8.2 % — AB (ref 4.0–5.6)

## 2019-01-29 MED ORDER — ONETOUCH VERIO W/DEVICE KIT: PACK | 0 refills | Status: DC

## 2019-01-29 MED ORDER — ATORVASTATIN CALCIUM 40 MG PO TABS: 40.0000 mg | ORAL_TABLET | Freq: Every day | ORAL | 1 refills | Status: DC

## 2019-01-29 MED ORDER — MIRTAZAPINE 30 MG PO TABS: 15.0000 mg | ORAL_TABLET | Freq: Every day | ORAL | 1 refills | Status: DC

## 2019-01-29 MED FILL — ONETOUCH VERIO REFLECT W/DE: W/DEVICE | 30 days supply | Qty: 1 | Fill #0
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
## 2019-01-30 LAB — LIPID PANEL
Chol/HDL Ratio: 2.2 ratio (ref 0.0–4.4)
Cholesterol, Total: 127 mg/dL (ref 100–199)
HDL: 57 mg/dL (ref >39)
LDL Chol Calc (NIH): 55 mg/dL (ref 0–99)
Triglycerides: 76 mg/dL (ref 0–149)
VLDL Cholesterol Cal: 15 mg/dL (ref 5–40)

## 2019-01-30 LAB — BASIC METABOLIC PANEL
BUN/Creatinine Ratio: 22 (ref 12–28)
BUN: 14 mg/dL (ref 8–27)
CO2: 24 mmol/L (ref 20–29)
Calcium: 9.2 mg/dL (ref 8.7–10.3)
Chloride: 101 mmol/L (ref 96–106)
Creatinine, Ser: 0.63 mg/dL (ref 0.57–1.00)
GFR calc Af Amer: 107 mL/min/{1.73_m2} (ref >59)
GFR calc non Af Amer: 93 mL/min/{1.73_m2} (ref >59)
Glucose: 258 mg/dL — ABNORMAL HIGH (ref 65–99)
Potassium: 4.1 mmol/L (ref 3.5–5.2)
Sodium: 141 mmol/L (ref 134–144)

## 2019-01-31 ENCOUNTER — Encounter: Payer: Self-pay | Admitting: Nurse Practitioner

## 2019-01-31 MED ORDER — TRULICITY 1.5 MG/0.5ML ~~LOC~~ SOAJ: 1.5000 mg | SUBCUTANEOUS | 3 refills | Status: AC

## 2019-01-31 MED ORDER — CANAGLIFLOZIN 300 MG PO TABS: 300.0000 mg | ORAL_TABLET | Freq: Every day | ORAL | 1 refills | Status: DC

## 2019-02-01 MED FILL — INVOKANA 300 MG TABLET: 300 | 30 days supply | Qty: 30 | Fill #0

## 2019-02-04 MED FILL — ALENDRONATE NA 70 MG TAB: 70 | 28 days supply | Qty: 4 | Fill #1

## 2019-02-11 MED FILL — ONETOUCH VERIO REFLECT W/DE: W/DEVICE | 30 days supply | Qty: 1 | Fill #0

## 2019-02-11 MED FILL — ALENDRONATE NA 70 MG TAB: 70 | 28 days supply | Qty: 4 | Fill #1

## 2019-02-15 MED FILL — TRULICITY 1.5 MG/0.5 ML PEN: 1.5 | 28 days supply | Qty: 2 | Fill #1

## 2019-02-23 MED FILL — ONE TOUCH DELICA 33G LANCET: 50 days supply | Qty: 100 | Fill #0

## 2019-02-23 MED FILL — ATORVASTATIN CALCIUM 40 MG: 40 | 30 days supply | Qty: 30 | Fill #2

## 2019-02-23 MED FILL — ONE TOUCH VERIO TEST STRIP: 50 days supply | Qty: 100 | Fill #1

## 2019-02-25 LAB — HM DIABETES EYE EXAM

## 2019-02-25 MED FILL — KETOROLAC 0.5% OPHTH SOLN: 0.5 | 21 days supply | Qty: 3 | Fill #0

## 2019-02-25 MED FILL — PREDNISOLONE AC 1% EYE DROP: 1 | 21 days supply | Qty: 5 | Fill #0

## 2019-02-25 MED FILL — OFLOXACIN 0.3% EYE DROPS: 0.3 | 21 days supply | Qty: 5 | Fill #0

## 2019-02-25 MED FILL — ALENDRONATE NA 70 MG TAB: 70 | 28 days supply | Qty: 4 | Fill #2

## 2019-03-23 MED FILL — TRULICITY 1.5 MG/0.5 ML PEN: 1.5 | 28 days supply | Qty: 2 | Fill #2

## 2019-03-26 ENCOUNTER — Other Ambulatory Visit: Payer: Self-pay

## 2019-03-26 ENCOUNTER — Ambulatory Visit
Admission: RE | Admit: 2019-03-26 | Discharge: 2019-03-26 | Disposition: A | Discharge status: Home / Self Care | Payer: Medicare Other | Source: Ambulatory Visit | Attending: Nurse Practitioner | Admitting: Nurse Practitioner

## 2019-03-26 DIAGNOSIS — Z1231 Encounter for screening mammogram for malignant neoplasm of breast: Secondary | ICD-10-CM

## 2019-03-30 MED FILL — ALENDRONATE NA 70 MG TAB: 70 | 28 days supply | Qty: 4 | Fill #3

## 2019-04-01 MED FILL — ATORVASTATIN CALCIUM 40 MG: 40 | 30 days supply | Qty: 30 | Fill #0

## 2019-04-19 MED FILL — OFLOXACIN 0.3% EYE DROPS: 0.3 | 21 days supply | Qty: 5 | Fill #1

## 2019-04-19 MED FILL — KETOROLAC 0.5% OPHTH SOLN: 0.5 | 21 days supply | Qty: 3 | Fill #1

## 2019-04-19 MED FILL — PREDNISOLONE AC 1% EYE DROP: 1 | 21 days supply | Qty: 5 | Fill #1

## 2019-04-26 MED FILL — TRULICITY 1.5 MG/0.5 ML PEN: 1.5 | 28 days supply | Qty: 2 | Fill #3

## 2019-05-03 ENCOUNTER — Ambulatory Visit: Payer: Medicare Other | Admitting: Nurse Practitioner

## 2019-05-04 MED FILL — ATORVASTATIN CALCIUM 40 MG: 40 | 30 days supply | Qty: 30 | Fill #1

## 2019-05-11 MED FILL — MIRTAZAPINE 30 MG TABLET: 30 | 30 days supply | Qty: 15 | Fill #0

## 2019-05-19 MED FILL — TRULICITY 1.5 MG/0.5 ML PEN: 1.5 | 28 days supply | Qty: 2 | Fill #4

## 2019-05-20 MED FILL — TRULICITY 1.5 MG/0.5 ML PEN: 1.5 | 28 days supply | Qty: 2 | Fill #5

## 2019-05-21 ENCOUNTER — Ambulatory Visit: Payer: Medicare PPO | Attending: Internal Medicine

## 2019-05-21 DIAGNOSIS — Z20822 Contact with and (suspected) exposure to covid-19: Secondary | ICD-10-CM

## 2019-05-22 LAB — NOVEL CORONAVIRUS, NAA: SARS-CoV-2, NAA: NOT DETECTED

## 2019-05-31 MED FILL — ALENDRONATE NA 70 MG TAB: 70 | 28 days supply | Qty: 4 | Fill #4

## 2019-06-01 ENCOUNTER — Ambulatory Visit: Payer: Medicare PPO | Admitting: General Surgery

## 2019-06-01 MED FILL — ATORVASTATIN CALCIUM 40 MG: 40 | 30 days supply | Qty: 30 | Fill #2

## 2019-06-03 ENCOUNTER — Ambulatory Visit: Payer: Medicare PPO | Attending: Physician Assistant | Admitting: Physician Assistant

## 2019-06-03 DIAGNOSIS — E1165 Type 2 diabetes mellitus with hyperglycemia: Secondary | ICD-10-CM | POA: Diagnosis not present

## 2019-06-03 DIAGNOSIS — M25532 Pain in left wrist: Secondary | ICD-10-CM | POA: Diagnosis not present

## 2019-06-03 NOTE — Progress Notes (Signed)
Virtual Visit via Telephone Note  I connected with Ana Hines on 06/03/19 at  2:10 PM EST by telephone and verified that I am speaking with the correct person using two identifiers.   I discussed the limitations, risks, security and privacy concerns of performing an evaluation and management service by telephone and the availability of in person appointments. I also discussed with the patient that there may be a patient responsible charge related to this service. The patient expressed understanding and agreed to proceed.  PATIENT visit by telephone virtually in the context of Covid-19 pandemic. Patient location:  home My Location:  [facility] office Persons on the call:  Me and the aptient   History of Present Illness: Pain in L wrist for at least 2 days.  Wrist is swollen.  NKI.  Awakened with it swollen Tuesday morning.  Some mild redness.  Pain reliever cream OTC helps a little.  Not as tender as it was.  Seems to be improving.  No fever.  No h/o gout  Blood sugar was 176 yesterday.  usu running 100-200.   Observations/Objective:  NAD.  A&Ox3   Assessment and Plan: 1. Left wrist pain Resolving since onset.  Continue OTC cream as needed.  Take advil or tylenol if needed.  Drink adequate water.    2. Type 2 diabetes mellitus with hyperglycemia, without long-term current use of insulin (HCC) Not controlled.  Continue current regmien and advised better adherence to diabetic diet.     Follow Up Instructions: See PCP in May as scheduled   I discussed the assessment and treatment plan with the patient. The patient was provided an opportunity to ask questions and all were answered. The patient agreed with the plan and demonstrated an understanding of the instructions.   The patient was advised to call back or seek an in-person evaluation if the symptoms worsen or if the condition fails to improve as anticipated.  I provided 7 minutes of non-face-to-face time during this  encounter.   Georgian Co, PA-C  Patient ID: Ana Hines, female   DOB: [birth year], 69 y.o.   MRN: [id number]

## 2019-06-03 NOTE — Progress Notes (Signed)
c /o Left swollen wrist and pain when tried to get out from the bed X2 days. Pain associated with movement Applied some biofize gel/

## 2019-06-18 ENCOUNTER — Ambulatory Visit: Payer: Medicare Other | Admitting: Podiatry

## 2019-07-02 MED FILL — ALENDRONATE NA 70 MG TAB: 70 | 28 days supply | Qty: 4 | Fill #5

## 2019-07-05 MED FILL — ATORVASTATIN CALCIUM 40 MG: 40 | 30 days supply | Qty: 30 | Fill #3

## 2019-07-05 MED FILL — TRULICITY 1.5 MG/0.5 ML PEN: 1.5 | 28 days supply | Qty: 2 | Fill #6

## 2019-07-05 MED FILL — MIRTAZAPINE 30 MG TABLET: 30 | 30 days supply | Qty: 15 | Fill #0

## 2019-07-16 ENCOUNTER — Ambulatory Visit: Payer: Medicare Other | Admitting: Podiatry

## 2019-08-11 ENCOUNTER — Other Ambulatory Visit: Payer: Self-pay | Admitting: Nurse Practitioner

## 2019-08-11 DIAGNOSIS — M810 Age-related osteoporosis without current pathological fracture: Secondary | ICD-10-CM

## 2019-08-11 MED FILL — TRULICITY 1.5 MG/0.5 ML PEN: 1.5 | 28 days supply | Qty: 2 | Fill #0

## 2019-08-13 MED FILL — ALENDRONATE NA 70 MG TAB: 70 | 28 days supply | Qty: 4 | Fill #0

## 2019-08-16 MED FILL — ATORVASTATIN CALCIUM 40 MG: 40 | 30 days supply | Qty: 30 | Fill #4

## 2019-08-20 ENCOUNTER — Encounter: Payer: Self-pay | Admitting: Podiatry

## 2019-08-20 ENCOUNTER — Ambulatory Visit (INDEPENDENT_AMBULATORY_CARE_PROVIDER_SITE_OTHER): Payer: Medicare PPO | Admitting: Podiatry

## 2019-08-20 ENCOUNTER — Other Ambulatory Visit: Payer: Self-pay

## 2019-08-20 VITALS — Temp 97.2°F

## 2019-08-20 DIAGNOSIS — M79674 Pain in right toe(s): Secondary | ICD-10-CM | POA: Diagnosis not present

## 2019-08-20 DIAGNOSIS — E119 Type 2 diabetes mellitus without complications: Secondary | ICD-10-CM

## 2019-08-20 DIAGNOSIS — M79675 Pain in left toe(s): Secondary | ICD-10-CM | POA: Diagnosis not present

## 2019-08-20 DIAGNOSIS — B351 Tinea unguium: Secondary | ICD-10-CM | POA: Diagnosis not present

## 2019-08-20 NOTE — Patient Instructions (Signed)
Diabetes Mellitus and Foot Care Foot care is an important part of your health, especially when you have diabetes. Diabetes may cause you to have problems because of poor blood flow (circulation) to your feet and legs, which can cause your skin to:  Become thinner and drier.  Break more easily.  Heal more slowly.  Peel and crack. You may also have nerve damage (neuropathy) in your legs and feet, causing decreased feeling in them. This means that you may not notice minor injuries to your feet that could lead to more serious problems. Noticing and addressing any potential problems early is the best way to prevent future foot problems. How to care for your feet Foot hygiene  Wash your feet daily with warm water and mild soap. Do not use hot water. Then, pat your feet and the areas between your toes until they are completely dry. Do not soak your feet as this can dry your skin.  Trim your toenails straight across. Do not dig under them or around the cuticle. File the edges of your nails with an emery board or nail file.  Apply a moisturizing lotion or petroleum jelly to the skin on your feet and to dry, brittle toenails. Use lotion that does not contain alcohol and is unscented. Do not apply lotion between your toes. Shoes and socks  Wear clean socks or stockings every day. Make sure they are not too tight. Do not wear knee-high stockings since they may decrease blood flow to your legs.  Wear shoes that fit properly and have enough cushioning. Always look in your shoes before you put them on to be sure there are no objects inside.  To break in new shoes, wear them for just a few hours a day. This prevents injuries on your feet. Wounds, scrapes, corns, and calluses  Check your feet daily for blisters, cuts, bruises, sores, and redness. If you cannot see the bottom of your feet, use a mirror or ask someone for help.  Do not cut corns or calluses or try to remove them with medicine.  If you  find a minor scrape, cut, or break in the skin on your feet, keep it and the skin around it clean and dry. You may clean these areas with mild soap and water. Do not clean the area with peroxide, alcohol, or iodine.  If you have a wound, scrape, corn, or callus on your foot, look at it several times a day to make sure it is healing and not infected. Check for: ? Redness, swelling, or pain. ? Fluid or blood. ? Warmth. ? Pus or a bad smell. General instructions  Do not cross your legs. This may decrease blood flow to your feet.  Do not use heating pads or hot water bottles on your feet. They may burn your skin. If you have lost feeling in your feet or legs, you may not know this is happening until it is too late.  Protect your feet from hot and cold by wearing shoes, such as at the beach or on hot pavement.  Schedule a complete foot exam at least once a year (annually) or more often if you have foot problems. If you have foot problems, report any cuts, sores, or bruises to your health care provider immediately. Contact a health care provider if:  You have a medical condition that increases your risk of infection and you have any cuts, sores, or bruises on your feet.  You have an injury that is not   healing.  You have redness on your legs or feet.  You feel burning or tingling in your legs or feet.  You have pain or cramps in your legs and feet.  Your legs or feet are numb.  Your feet always feel cold.  You have pain around a toenail. Get help right away if:  You have a wound, scrape, corn, or callus on your foot and: ? You have pain, swelling, or redness that gets worse. ? You have fluid or blood coming from the wound, scrape, corn, or callus. ? Your wound, scrape, corn, or callus feels warm to the touch. ? You have pus or a bad smell coming from the wound, scrape, corn, or callus. ? You have a fever. ? You have a red line going up your leg. Summary  Check your feet every day  for cuts, sores, red spots, swelling, and blisters.  Moisturize feet and legs daily.  Wear shoes that fit properly and have enough cushioning.  If you have foot problems, report any cuts, sores, or bruises to your health care provider immediately.  Schedule a complete foot exam at least once a year (annually) or more often if you have foot problems. This information is not intended to replace advice given to you by your health care provider. Make sure you discuss any questions you have with your health care provider. Document Revised: 12/30/2018 Document Reviewed: 05/10/2016 Elsevier Patient Education  2020 [employer]vier Inc.  

## 2019-08-23 ENCOUNTER — Telehealth: Payer: Self-pay

## 2019-08-23 NOTE — Telephone Encounter (Signed)
Patient was told by family member that she should be tested for TB, patient would like a call from her nurse.
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
## 2019-08-23 NOTE — Telephone Encounter (Signed)
This can be addressed at her appointment with provider on 08/31/2019.

## 2019-08-24 ENCOUNTER — Telehealth: Payer: Self-pay | Admitting: Nurse Practitioner

## 2019-08-24 ENCOUNTER — Other Ambulatory Visit: Payer: Self-pay

## 2019-08-24 DIAGNOSIS — Z201 Contact with and (suspected) exposure to tuberculosis: Secondary | ICD-10-CM

## 2019-08-24 NOTE — Telephone Encounter (Signed)
Spoke to patient and scheduled her a lab appt
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
## 2019-08-25 ENCOUNTER — Other Ambulatory Visit: Payer: Self-pay

## 2019-08-25 ENCOUNTER — Other Ambulatory Visit: Payer: Self-pay | Admitting: Nurse Practitioner

## 2019-08-25 ENCOUNTER — Ambulatory Visit: Payer: BC Managed Care – PPO | Attending: Nurse Practitioner

## 2019-08-25 DIAGNOSIS — E1169 Type 2 diabetes mellitus with other specified complication: Secondary | ICD-10-CM

## 2019-08-25 DIAGNOSIS — Z201 Contact with and (suspected) exposure to tuberculosis: Secondary | ICD-10-CM | POA: Diagnosis not present

## 2019-08-27 LAB — CMP14+EGFR
ALT: 37 IU/L — ABNORMAL HIGH (ref 0–32)
AST: 53 IU/L — ABNORMAL HIGH (ref 0–40)
Albumin/Globulin Ratio: 1.7 (ref 1.2–2.2)
Albumin: 4.6 g/dL (ref 3.8–4.8)
Alkaline Phosphatase: 103 IU/L (ref 39–117)
BUN/Creatinine Ratio: 18 (ref 12–28)
BUN: 13 mg/dL (ref 8–27)
Bilirubin Total: 0.4 mg/dL (ref 0.0–1.2)
CO2: 21 mmol/L (ref 20–29)
Calcium: 9.2 mg/dL (ref 8.7–10.3)
Chloride: 101 mmol/L (ref 96–106)
Creatinine, Ser: 0.73 mg/dL (ref 0.57–1.00)
GFR calc Af Amer: 99 mL/min/{1.73_m2} (ref >59)
GFR calc non Af Amer: 85 mL/min/{1.73_m2} (ref >59)
Globulin, Total: 2.7 g/dL (ref 1.5–4.5)
Glucose: 265 mg/dL — ABNORMAL HIGH (ref 65–99)
Potassium: 4.2 mmol/L (ref 3.5–5.2)
Sodium: 139 mmol/L (ref 134–144)
Total Protein: 7.3 g/dL (ref 6.0–8.5)

## 2019-08-27 LAB — QUANTIFERON-TB GOLD PLUS
QuantiFERON Mitogen Value: >10 IU/mL
QuantiFERON Nil Value: 0.03 IU/mL
QuantiFERON TB1 Ag Value: 0.02 IU/mL
QuantiFERON TB2 Ag Value: 0.03 IU/mL
QuantiFERON-TB Gold Plus: NEGATIVE

## 2019-08-27 LAB — MICROALBUMIN / CREATININE URINE RATIO
Creatinine, Urine: 102.6 mg/dL
Microalb/Creat Ratio: 11 mg/g creat (ref 0–29)
Microalbumin, Urine: 10.9 ug/mL

## 2019-08-27 LAB — HEMOGLOBIN A1C
Est. average glucose Bld gHb Est-mCnc: 206 mg/dL
Hgb A1c MFr Bld: 8.8 % — ABNORMAL HIGH (ref 4.8–5.6)

## 2019-08-31 ENCOUNTER — Other Ambulatory Visit: Payer: Self-pay

## 2019-08-31 ENCOUNTER — Other Ambulatory Visit: Payer: Self-pay | Admitting: Nurse Practitioner

## 2019-08-31 ENCOUNTER — Ambulatory Visit: Payer: BC Managed Care – PPO | Attending: Nurse Practitioner | Admitting: Nurse Practitioner

## 2019-08-31 ENCOUNTER — Encounter: Payer: Self-pay | Admitting: Nurse Practitioner

## 2019-08-31 DIAGNOSIS — E1165 Type 2 diabetes mellitus with hyperglycemia: Secondary | ICD-10-CM

## 2019-08-31 DIAGNOSIS — F329 Major depressive disorder, single episode, unspecified: Secondary | ICD-10-CM

## 2019-08-31 DIAGNOSIS — F32A Depression, unspecified: Secondary | ICD-10-CM

## 2019-08-31 DIAGNOSIS — M810 Age-related osteoporosis without current pathological fracture: Secondary | ICD-10-CM | POA: Diagnosis not present

## 2019-08-31 DIAGNOSIS — F419 Anxiety disorder, unspecified: Secondary | ICD-10-CM | POA: Diagnosis not present

## 2019-08-31 MED ORDER — MIRTAZAPINE 30 MG PO TABS: 15.0000 mg | ORAL_TABLET | Freq: Every day | ORAL | 1 refills | Status: DC

## 2019-08-31 MED ORDER — ACCU-CHEK GUIDE VI STRP
ORAL_STRIP | 12 refills | Status: DC
Start: 2019-08-31 — End: 2020-03-08

## 2019-08-31 MED ORDER — ATORVASTATIN CALCIUM 40 MG PO TABS: 40.0000 mg | ORAL_TABLET | Freq: Every day | ORAL | 1 refills | Status: DC

## 2019-08-31 MED ORDER — ACCU-CHEK SOFTCLIX LANCETS MISC
3 refills | Status: DC
Start: 2019-08-31 — End: 2020-12-01

## 2019-08-31 MED ORDER — ONETOUCH VERIO VI STRP: ORAL_STRIP | 12 refills | Status: DC

## 2019-08-31 MED ORDER — ALENDRONATE SODIUM 70 MG PO TABS: ORAL_TABLET | ORAL | 2 refills | Status: DC

## 2019-08-31 MED ORDER — TRULICITY 1.5 MG/0.5ML ~~LOC~~ SOAJ: 1.5000 mg | SUBCUTANEOUS | 0 refills | Status: DC

## 2019-08-31 MED ORDER — BD PEN NEEDLE MINI U/F 31G X 5 MM MISC: 1 refills | Status: AC

## 2019-08-31 MED ORDER — ONETOUCH DELICA LANCETS 33G MISC: 12 refills | Status: DC

## 2019-08-31 NOTE — Progress Notes (Signed)
Virtual Visit via Telephone Note Due to national recommendations of social distancing due to [place] 19, telehealth visit is felt to be most appropriate for this patient at this time.  I discussed the limitations, risks, security and privacy concerns of performing an evaluation and management service by telephone and the availability of in person appointments. I also discussed with the patient that there may be a patient responsible charge related to this service. The patient expressed understanding and agreed to proceed.    I connected with Ana Hines on 08/31/19  at   2:10 PM EDT  EDT by telephone and verified that I am speaking with the correct person using two identifiers.   Consent I discussed the limitations, risks, security and privacy concerns of performing an evaluation and management service by telephone and the availability of in person appointments. I also discussed with the patient that there may be a patient responsible charge related to this service. The patient expressed understanding and agreed to proceed.   Location of Patient: Private Residence    Location of Provider: [place] [facility] [place] participating in Telemedicine visit: Geryl Rankins FNP-BC [place]    History of Present Illness: Telemedicine visit for: DM TYPE 2  DM TYPE 2 She has not been monitoring her blood glucose levels. Needs strips and lancets. Up to date with eye exam. Had cataract surgery in December. Denies neuropathy symptoms or hypoglycemia. DM not well controlled. I have refilled her Trulicity 1.5 mg today. LDL at goal of <70. Taking atorvastatin 40 mg.  Lab Results  Component Value Date   HGBA1C 8.8 (H) 08/25/2019   Lab Results  Component Value Date   LDLCALC 55 01/29/2019     Past Medical History:  Diagnosis Date  . Diabetes mellitus without complication (HCC) 20 years ago  . Hyperlipidemia   . Osteoporosis     Past Surgical  History:  Procedure Laterality Date  . TUBAL LIGATION      Family History  Problem Relation Age of Onset  . Breast cancer Sister   . Ovarian cancer Cousin     Social History   Socioeconomic History  . Marital status: Divorced    Spouse name: Not on file  . Number of children: Not on file  . Years of education: Not on file  . Highest education level: Not on file  Occupational History  . Not on file  Tobacco Use  . Smoking status: Current Every Day Smoker    Packs/day: 0.50    Types: Cigarettes  . Smokeless tobacco: Never Used  Substance and Sexual Activity  . Alcohol use: Yes    Alcohol/week: 2.0 - 3.0 standard drinks    Types: 2 - 3 Standard drinks or equivalent per week  . Drug use: Yes    Types: Marijuana    Comment: Occasionally  . Sexual activity: Not on file  Other Topics Concern  . Not on file  Social History Narrative  . Not on file   Social Determinants of Health   Financial Resource Strain:   . Difficulty of Paying Living Expenses:   Food Insecurity:   . Worried About Charity fundraiser in the Last Year:   . Arboriculturist in the Last Year:   Transportation Needs:   . Film/video editor (Medical):   Marland Kitchen Lack of Transportation (Non-Medical):   Physical Activity:   . Days of Exercise per Week:   .  Minutes of Exercise per Session:   Stress:   . Feeling of Stress :   Social Connections:   . Frequency of Communication with Friends and Family:   . Frequency of Social Gatherings with Friends and Family:   . Attends Religious Services:   . Active Member of Clubs or Organizations:   . Attends Banker Meetings:   Marland Kitchen Marital Status:      Observations/Objective: Awake, alert and oriented x 3   Review of Systems  Constitutional: Negative for fever, malaise/fatigue and weight loss.  HENT: Negative.  Negative for nosebleeds.   Eyes: Negative.  Negative for blurred vision, double vision and photophobia.  Respiratory: Negative.  Negative  for cough and shortness of breath.   Cardiovascular: Negative.  Negative for chest pain, palpitations and leg swelling.  Gastrointestinal: Negative.  Negative for heartburn, nausea and vomiting.  Musculoskeletal: Negative.  Negative for myalgias.  Neurological: Negative.  Negative for dizziness, focal weakness, seizures and headaches.  Psychiatric/Behavioral: Positive for depression. Negative for suicidal ideas.    Assessment and Plan: Ana Hines was seen today for follow-up.  Diagnoses and all orders for this visit:  Type 2 diabetes mellitus with hyperglycemia, without long-term current use of insulin (HCC) -     atorvastatin (LIPITOR) 40 MG tablet; Take 1 tablet (40 mg total) by mouth daily. -     glucose blood (ONETOUCH VERIO) test strip; Use as directed at least twice per day -     OneTouch Delica Lancets 33G MISC; Use as directed at least twice per day -     Insulin Pen Needle (B-D UF III MINI PEN NEEDLES) 31G X 5 MM MISC; Use as instructed. Inject into the skin once nightly. E11.65 Diabetes is poorly controlled. Advised patient to keep a fasting blood sugar log fast, 2 hours post lunch and bedtime which will be reviewed at the next office visit. Continue blood sugar control as discussed in office today, low carbohydrate diet, and regular physical exercise as tolerated, 150 minutes per week (30 min each day, 5 days per week, or 50 min 3 days per week). Keep blood sugar logs with fasting goal of 90-130 mg/dl, post prandial (after you eat) less than 180.  For Hypoglycemia: BS <60 and Hyperglycemia BS >400; contact the clinic ASAP. Annual eye exams and foot exams are recommended.   Anxiety and depression -     mirtazapine (REMERON) 30 MG tablet; Take 0.5 tablets (15 mg total) by mouth at bedtime.  Age-related osteoporosis without current pathological fracture -     alendronate (FOSAMAX) 70 MG tablet; TAKE 1 TABLET BY MOUTH ONCE EVERY WEEK, WITH WATER, ON AN EMPTY STOMACH     Follow Up  Instructions Return in about 3 months (around 12/01/2019).     I discussed the assessment and treatment plan with the patient. The patient was provided an opportunity to ask questions and all were answered. The patient agreed with the plan and demonstrated an understanding of the instructions.   The patient was advised to call back or seek an in-person evaluation if the symptoms worsen or if the condition fails to improve as anticipated.  I provided 15 minutes of non-face-to-face time during this encounter including median intraservice time, reviewing previous notes, labs, imaging, medications and explaining diagnosis and management.  Claiborne Rigg, FNP-BC

## 2019-09-08 MED FILL — ALENDRONATE NA 70 MG TAB: 70 | 28 days supply | Qty: 4 | Fill #0

## 2019-09-10 MED FILL — ATORVASTATIN CALCIUM 40 MG: 40 | 30 days supply | Qty: 30 | Fill #5

## 2019-09-14 MED FILL — TRULICITY 1.5 MG/0.5 ML PEN: 1.5 | 28 days supply | Qty: 2 | Fill #1

## 2019-10-22 MED FILL — ATORVASTATIN CALCIUM 40 MG: 40 | 90 days supply | Qty: 90 | Fill #0

## 2019-10-22 MED FILL — ALENDRONATE NA 70 MG TAB: 70 | 28 days supply | Qty: 4 | Fill #1

## 2019-10-26 MED FILL — TRULICITY 1.5 MG/0.5 ML PEN: 1.5 | 28 days supply | Qty: 2 | Fill #2

## 2019-11-19 ENCOUNTER — Ambulatory Visit: Payer: Medicare PPO | Admitting: Podiatry

## 2019-11-22 ENCOUNTER — Ambulatory Visit: Payer: Medicaid Other | Admitting: Podiatry

## 2019-11-23 ENCOUNTER — Encounter: Payer: Self-pay | Admitting: Podiatry

## 2019-11-23 ENCOUNTER — Other Ambulatory Visit: Payer: Self-pay

## 2019-11-23 ENCOUNTER — Ambulatory Visit (INDEPENDENT_AMBULATORY_CARE_PROVIDER_SITE_OTHER): Payer: Medicare HMO | Admitting: Podiatry

## 2019-11-23 DIAGNOSIS — L84 Corns and callosities: Secondary | ICD-10-CM | POA: Diagnosis not present

## 2019-11-23 DIAGNOSIS — E119 Type 2 diabetes mellitus without complications: Secondary | ICD-10-CM

## 2019-11-23 DIAGNOSIS — M79674 Pain in right toe(s): Secondary | ICD-10-CM

## 2019-11-23 DIAGNOSIS — B351 Tinea unguium: Secondary | ICD-10-CM

## 2019-11-23 DIAGNOSIS — M79675 Pain in left toe(s): Secondary | ICD-10-CM

## 2019-11-23 DIAGNOSIS — Q828 Other specified congenital malformations of skin: Secondary | ICD-10-CM
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
## 2019-11-25 MED FILL — TRULICITY 1.5 MG/0.5 ML PEN: 1.5 | 28 days supply | Qty: 2 | Fill #3

## 2019-11-26 ENCOUNTER — Other Ambulatory Visit: Payer: Self-pay

## 2019-11-26 ENCOUNTER — Encounter: Payer: Self-pay | Admitting: Nurse Practitioner

## 2019-11-26 ENCOUNTER — Ambulatory Visit: Payer: Medicare HMO | Attending: Nurse Practitioner | Admitting: Nurse Practitioner

## 2019-11-26 VITALS — BP 127/65 | HR 82 | Temp 97.7°F | Wt 91.0 lb

## 2019-11-26 DIAGNOSIS — R64 Cachexia: Secondary | ICD-10-CM

## 2019-11-26 DIAGNOSIS — M810 Age-related osteoporosis without current pathological fracture: Secondary | ICD-10-CM

## 2019-11-26 DIAGNOSIS — E1165 Type 2 diabetes mellitus with hyperglycemia: Secondary | ICD-10-CM

## 2019-11-26 DIAGNOSIS — Z720 Tobacco use: Secondary | ICD-10-CM

## 2019-11-26 DIAGNOSIS — J439 Emphysema, unspecified: Secondary | ICD-10-CM

## 2019-11-26 LAB — POCT GLYCOSYLATED HEMOGLOBIN (HGB A1C): Hemoglobin A1C: 8.2 % — AB (ref 4.0–5.6)

## 2019-11-26 LAB — GLUCOSE, POCT (MANUAL RESULT ENTRY): POC Glucose: 240 mg/dl — AB (ref 70–99)
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
## 2019-11-27 ENCOUNTER — Encounter: Payer: Self-pay | Admitting: Nurse Practitioner

## 2019-11-27 LAB — CMP14+EGFR
ALT: 29 IU/L (ref 0–32)
AST: 39 IU/L (ref 0–40)
Albumin/Globulin Ratio: 1.9 (ref 1.2–2.2)
Albumin: 4.6 g/dL (ref 3.8–4.8)
Alkaline Phosphatase: 84 IU/L (ref 48–121)
BUN/Creatinine Ratio: 14 (ref 12–28)
BUN: 10 mg/dL (ref 8–27)
Bilirubin Total: 0.4 mg/dL (ref 0.0–1.2)
CO2: 25 mmol/L (ref 20–29)
Calcium: 9.9 mg/dL (ref 8.7–10.3)
Chloride: 101 mmol/L (ref 96–106)
Creatinine, Ser: 0.72 mg/dL (ref 0.57–1.00)
GFR calc Af Amer: 100 mL/min/{1.73_m2} (ref >59)
GFR calc non Af Amer: 86 mL/min/{1.73_m2} (ref >59)
Globulin, Total: 2.4 g/dL (ref 1.5–4.5)
Glucose: 215 mg/dL — ABNORMAL HIGH (ref 65–99)
Potassium: 4.5 mmol/L (ref 3.5–5.2)
Sodium: 139 mmol/L (ref 134–144)
Total Protein: 7 g/dL (ref 6.0–8.5)

## 2019-11-27 LAB — CBC WITH DIFFERENTIAL/PLATELET
Basophils Absolute: 0.1 10*3/uL (ref 0.0–0.2)
Basos: 1 %
EOS (ABSOLUTE): 0.2 10*3/uL (ref 0.0–0.4)
Eos: 2 %
Hematocrit: 35.1 % (ref 34.0–46.6)
Hemoglobin: 11.7 g/dL (ref 11.1–15.9)
Immature Grans (Abs): 0 10*3/uL (ref 0.0–0.1)
Immature Granulocytes: 0 %
Lymphocytes Absolute: 2.7 10*3/uL (ref 0.7–3.1)
Lymphs: 24 %
MCH: 33.1 pg — ABNORMAL HIGH (ref 26.6–33.0)
MCHC: 33.3 g/dL (ref 31.5–35.7)
MCV: 99 fL — ABNORMAL HIGH (ref 79–97)
Monocytes Absolute: 0.6 10*3/uL (ref 0.1–0.9)
Monocytes: 6 %
Neutrophils Absolute: 7.6 10*3/uL — ABNORMAL HIGH (ref 1.4–7.0)
Neutrophils: 67 %
Platelets: 274 10*3/uL (ref 150–450)
RBC: 3.53 x10E6/uL — ABNORMAL LOW (ref 3.77–5.28)
RDW: 12.2 % (ref 11.7–15.4)
WBC: 11.1 10*3/uL — ABNORMAL HIGH (ref 3.4–10.8)

## 2019-11-27 LAB — CA 125: Cancer Antigen (CA) 125: 8.7 U/mL (ref 0.0–38.1)

## 2019-11-29 MED FILL — ALENDRONATE NA 70 MG TAB: 70 | 28 days supply | Qty: 4 | Fill #2

## 2019-12-01 NOTE — Progress Notes (Signed)
Pt. Was informed on referral. Pt. Has an appt. W/ the dietician.

## 2019-12-08 ENCOUNTER — Other Ambulatory Visit: Payer: Self-pay | Admitting: Nurse Practitioner

## 2019-12-08 ENCOUNTER — Other Ambulatory Visit (HOSPITAL_BASED_OUTPATIENT_CLINIC_OR_DEPARTMENT_OTHER): Payer: Self-pay | Admitting: Nurse Practitioner

## 2019-12-08 DIAGNOSIS — M810 Age-related osteoporosis without current pathological fracture: Secondary | ICD-10-CM

## 2019-12-08 DIAGNOSIS — Z1231 Encounter for screening mammogram for malignant neoplasm of breast: Secondary | ICD-10-CM

## 2019-12-13 NOTE — Evaluation (Signed)
LUNG CT approved [id number] authorization number

## 2019-12-14 ENCOUNTER — Ambulatory Visit (HOSPITAL_COMMUNITY)
Admission: RE | Admit: 2019-12-14 | Discharge: 2019-12-14 | Disposition: A | Discharge status: Home / Self Care | Payer: BC Managed Care – PPO | Source: Ambulatory Visit | Attending: Nurse Practitioner | Admitting: Nurse Practitioner

## 2019-12-31 ENCOUNTER — Ambulatory Visit (HOSPITAL_COMMUNITY): Payer: BC Managed Care – PPO | Attending: Nurse Practitioner

## 2020-01-04 ENCOUNTER — Other Ambulatory Visit: Payer: Self-pay | Admitting: Nurse Practitioner

## 2020-01-04 DIAGNOSIS — M810 Age-related osteoporosis without current pathological fracture: Secondary | ICD-10-CM

## 2020-01-04 MED FILL — TRULICITY 1.5 MG/0.5 ML PEN: 1.5 | 28 days supply | Qty: 2 | Fill #4

## 2020-01-04 MED FILL — ALENDRONATE NA 70 MG TAB: 70 | 28 days supply | Qty: 4 | Fill #0

## 2020-01-18 ENCOUNTER — Encounter: Payer: Self-pay | Admitting: Registered"

## 2020-01-18 ENCOUNTER — Encounter: Payer: Medicare HMO | Attending: Nurse Practitioner | Admitting: Registered"

## 2020-01-18 ENCOUNTER — Other Ambulatory Visit: Payer: Self-pay

## 2020-01-18 DIAGNOSIS — E118 Type 2 diabetes mellitus with unspecified complications: Secondary | ICD-10-CM | POA: Diagnosis not present

## 2020-01-18 NOTE — Progress Notes (Signed)
Medical Nutrition Therapy:  Appt start time: 11:10 end time:  12:15.   Assessment:  Primary concerns today: Per referral, pt has Type 2 diabetes and referred to our office related to tobacco abuse, cachectic, and pulmonary emphysema. Recent A1c 8.2 (11/2019). An increase from 7.8 (05/2018).   States she has been trying to get protein in her. States she has purchased protein powder and mixing with almond milk; doesn't like it unless it has fruit in it as well. States she has been trying to drink 2 shakes/day. Also drinks Glucerna sometimes. States she was able to eat more and be intentional when she was on vacation for 3 weeks in [place] with family. Reports since being home, she is busy, and dealing with neighbors she forgets to have shake.   Reports she has been trying to drink more water. Helps when she adds flavor packs to water for taste. States she typically eats her meals first and then drinks water. States she wasn't full after dinner last night. States she didn't want to eat too much starch which is why she didn't get seconds. States she likes oatmeal + fruit as breakfast option sometimes.   States she eats chips and 1/2 sandwich for lunch sometimes.  Typically doesn't eat much meat due to teeth/chewing challenges.    Sleeps about 8.5 hours/night. Wakes up around 8:30am.   Lives with 3 sons in a 1 bedroom apartment. States she does all the cooking and providing of meals for her family. States she has enough money to get food and if she doesn't, there is a program that comes to her neighborhood weekly on Saturdays providing food, water, and other essential items.     Preferred Learning Style:   No preference indicated   Learning Readiness:   Ready  Change in progress   MEDICATIONS: See list   DIETARY INTAKE:  Usual eating pattern includes 2 meals and 3 snacks per day.  Everyday foods include oatmeal, Glucerna, fruit.  Avoided foods include grits.    24-hr recall:  B (10  AM): [place]-bacon, egg, cheese biscuit +  Coffee or boiled egg + toast with butter + coffee Snk (3 PM): Glucerna + 2 breakfast cookies Snk (6 PM): banana + potato stix D (8 PM): 1/8 plate roast beef + 1/2 c stewed potatoes + 1/2 c pinto beans + 1/2 c mixed greens + 1 slice cornbread + 8 oz water  Snk (10:30 PM): ice cream cone + cheese puffs + sugar-free popsicle Beverages: water (3*16 oz; 48+ oz), coffee, beer (1-2/day, 7 days/week), liquor (socially)  Usual physical activity: walking in neighborhood 30 min, 5 days/week  Estimated energy needs: 1800-2000 calories 225-250 g carbohydrates 135-150 g protein 40-44 g fat  Progress Towards Goal(s):  In progress.   Nutritional Diagnosis:  NB-1.1 Food and nutrition-related knowledge deficit As related to skipping meals.  As evidenced by dietary recall.    Intervention:  Nutrition education and counseling. Pt was educated and counseled on eating to nourish the body, ways to increase nourishment, Rule of 3's, and meal/snack planning. Discussed potentially having feelings of fullness when increasing intake. Pt was in agreement with goals listed. Goals: - Aim to eat breakfast within 1-2 hours of waking up.  - Eat meal/snack every 3 hours.   B 10 am   L   1 pm  S   4 pm  D   7 pm  S  10 pm - Aim to have 1/4 plate protein, 1/4 plate fruit/vegetable, 1/2  plate starch/grain + fat + dairy for meals.  - When having vegetable soup, add grilled cheese sandwich.  - Add yogurt or a glass of milk to breakfast.  - Snacks should be 2 food groups: cheese and crackers, peanut butter crackers, Glucerna, or zero sugar Muscle Milk.  Teaching Method Utilized:  Visual Auditory Hands on  Handouts given during visit include:  High Calorie, High Protein Nutrition Therapy  Barriers to learning/adherence to lifestyle change: home-life balance, time management  Demonstrated degree of understanding via:  Teach Back   Monitoring/Evaluation:  Dietary  intake, exercise, and body weight in 1 month(s).

## 2020-01-18 NOTE — Patient Instructions (Signed)
-   Aim to eat breakfast within 1-2 hours of waking up.   - Eat meal/snack every 3 hours.   B 10 am   L   1 pm  S   4 pm  D   7 pm  S  10 pm  - Aim to have 1/4 plate protein, 1/4 plate fruit/vegetable, 1/2 plate starch/grain + fat + dairy for meals.   - When having vegetable soup, add grilled cheese sandwich.   - Add yogurt or a glass of milk to breakfast.   - Snacks should be 2 food groups: cheese and crackers, peanut butter crackers, Glucerna, or zero sugar Muscle Milk.

## 2020-01-21 ENCOUNTER — Ambulatory Visit (HOSPITAL_COMMUNITY): Admission: RE | Admit: 2020-01-21 | Payer: BC Managed Care – PPO | Source: Ambulatory Visit

## 2020-01-26 MED FILL — TRULICITY 1.5 MG/0.5 ML PEN: 1.5 | 28 days supply | Qty: 2 | Fill #5

## 2020-02-04 MED FILL — TRULICITY 1.5 MG/0.5 ML PEN: 1.5 | 84 days supply | Qty: 6 | Fill #0

## 2020-02-07 MED FILL — ALENDRONATE NA 70 MG TAB: 70 | 28 days supply | Qty: 4 | Fill #1

## 2020-02-07 MED FILL — ATORVASTATIN CALCIUM 40 MG: 40 | 90 days supply | Qty: 90 | Fill #1

## 2020-02-17 ENCOUNTER — Encounter: Payer: Self-pay | Admitting: Registered"

## 2020-02-17 ENCOUNTER — Encounter: Payer: Medicare HMO | Attending: Nurse Practitioner | Admitting: Registered"

## 2020-02-17 ENCOUNTER — Other Ambulatory Visit: Payer: Self-pay

## 2020-02-17 DIAGNOSIS — E119 Type 2 diabetes mellitus without complications: Secondary | ICD-10-CM | POA: Diagnosis not present

## 2020-02-17 NOTE — Patient Instructions (Addendum)
-   Great job eating throughout the day. Keep it up!  - Check blood sugar numbers at least 3 times a day: fasting (before breakfast), and 2 hours after meals.

## 2020-02-17 NOTE — Progress Notes (Signed)
Diabetes Self-Management Education  Visit Type:  Follow-up  Appt. Start Time: 11:12 Appt. End Time: 11:36  02/24/2020  [name], identified by name and date of birth, is a 69 y.o. female with a diagnosis of Diabetes:  .   ASSESSMENT  Pt arrives late due to being at wrong location. States she has been trying to eat every couple of hours. States she had dentist appt on 10/26. States she is going to get some new dentures. Supposed to get them 11/3. States she is hoping this will help her eat and chew better. States she has to spit out beef because she is unable to swallow it. Plans to go to [place], [place] for about 3 weeks in November to visit family.    States there are challenges with home life and sons living with her.   States she can feel when BS is high due to headaches; when low feels nervous, stomach jumping and will eat candy. States she is planning to have bbq sandwich + onion rings + water for lunch today from [employer]. States she had 2 plates of beef stir fry + rice last night in addition to [place] sandwich.   There were no vitals taken for this visit. There is no height or weight on file to calculate BMI.    Diabetes Self-Management Education - 02/17/20 1114      Dietary Intake   Breakfast steak + egg + toast    Snack (morning) Glucerna    Lunch [employer]-quarter pounder with cheese + fruit cup + Glucerna    Snack (afternoon) pork skins + 2 slim jims + fruit cup    Dinner [place] sandwich (with wheat bread, mayo, tomato, cheese) + handful of chips + fruit + Glucerna    Snack (evening) 12 crackers + cheese + Klondike bar    Beverage(s) Glucerna (3*8 oz; 24 oz), beer (24 oz), water (2*16 oz; 32 oz); 64+ oz      Exercise   Exercise Type ADL's;Light (walking / raking leaves)   cleaning at home   How many days per week to you exercise? 30    How many minutes per day do you exercise? 2    Total minutes per week of exercise 60      Patient Education   Previous  Diabetes Education Yes (please comment)      Post-Education Assessment   Patient understands the diabetes disease and treatment process. Demonstrates understanding / competency    Patient understands incorporating nutritional management into lifestyle. Demonstrates understanding / competency    Patient undertands incorporating physical activity into lifestyle. Demonstrates understanding / competency    Patient understands using medications safely. Demonstrates understanding / competency    Patient understands monitoring blood glucose, interpreting and using results Demonstrates understanding / competency    Patient understands prevention, detection, and treatment of acute complications. Needs Review    Patient understands prevention, detection, and treatment of chronic complications. Demonstrates understanding / competency    Patient understands how to develop strategies to address psychosocial issues. Needs Review    Patient understands how to develop strategies to promote health/change behavior. Demonstrates understanding / competency      Outcomes   Program Status Not Completed      Subsequent Visit   Since your last visit have you continued or begun to take your medications as prescribed? Yes    Since your last visit have you had your blood pressure checked? No    Since your last visit have you  experienced any weight changes? No change    Since your last visit, are you checking your blood glucose at least once a day? No   started off checking it once a day, but life has been too hectic          Learning Objective:  Patient will have a greater understanding of diabetes self-management. Patient education plan is to attend individual and/or group sessions per assessed needs and concerns.   Plan:   Patient Instructions  - Great job eating throughout the day. Keep it up!  - Check blood sugar numbers at least 3 times a day: fasting (before breakfast), and 2 hours after meals.      Expected Outcomes:  Demonstrated interest in learning. Expect positive outcomes  Education material provided: none  If problems or questions, patient to contact team via:  Phone and Email  Future DSME appointment: - 4-6 wks

## 2020-02-29 ENCOUNTER — Encounter: Payer: Self-pay | Admitting: Podiatry

## 2020-02-29 ENCOUNTER — Other Ambulatory Visit: Payer: Self-pay

## 2020-02-29 ENCOUNTER — Ambulatory Visit (INDEPENDENT_AMBULATORY_CARE_PROVIDER_SITE_OTHER): Payer: Medicare HMO | Admitting: Podiatry

## 2020-02-29 DIAGNOSIS — M2042 Other hammer toe(s) (acquired), left foot: Secondary | ICD-10-CM | POA: Diagnosis not present

## 2020-02-29 DIAGNOSIS — B351 Tinea unguium: Secondary | ICD-10-CM

## 2020-02-29 DIAGNOSIS — Q828 Other specified congenital malformations of skin: Secondary | ICD-10-CM | POA: Diagnosis not present

## 2020-02-29 DIAGNOSIS — E119 Type 2 diabetes mellitus without complications: Secondary | ICD-10-CM

## 2020-02-29 DIAGNOSIS — M79674 Pain in right toe(s): Secondary | ICD-10-CM

## 2020-02-29 DIAGNOSIS — M2041 Other hammer toe(s) (acquired), right foot: Secondary | ICD-10-CM

## 2020-03-02 MED FILL — ACCU-CHEK AVIVA PLUS STRP: 50 days supply | Qty: 100 | Fill #1
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
## 2020-03-07 ENCOUNTER — Other Ambulatory Visit: Payer: Self-pay | Admitting: Nurse Practitioner

## 2020-03-07 DIAGNOSIS — M810 Age-related osteoporosis without current pathological fracture: Secondary | ICD-10-CM

## 2020-03-08 ENCOUNTER — Other Ambulatory Visit: Payer: Self-pay

## 2020-03-08 ENCOUNTER — Other Ambulatory Visit: Payer: Self-pay | Admitting: Nurse Practitioner

## 2020-03-08 ENCOUNTER — Ambulatory Visit (HOSPITAL_COMMUNITY)
Admission: RE | Admit: 2020-03-08 | Discharge: 2020-03-08 | Disposition: A | Discharge status: Home / Self Care | Payer: Medicare HMO | Source: Ambulatory Visit | Attending: Nurse Practitioner | Admitting: Nurse Practitioner

## 2020-03-08 DIAGNOSIS — Z122 Encounter for screening for malignant neoplasm of respiratory organs: Secondary | ICD-10-CM | POA: Insufficient documentation

## 2020-03-08 DIAGNOSIS — F17210 Nicotine dependence, cigarettes, uncomplicated: Secondary | ICD-10-CM | POA: Diagnosis not present

## 2020-03-08 DIAGNOSIS — I700 Atherosclerosis of aorta: Secondary | ICD-10-CM | POA: Insufficient documentation

## 2020-03-08 DIAGNOSIS — J439 Emphysema, unspecified: Secondary | ICD-10-CM | POA: Diagnosis not present

## 2020-03-08 DIAGNOSIS — Z87891 Personal history of nicotine dependence: Secondary | ICD-10-CM | POA: Diagnosis not present

## 2020-03-08 DIAGNOSIS — Z720 Tobacco use: Secondary | ICD-10-CM

## 2020-03-13 MED FILL — ALENDRONATE NA 70 MG TAB: 70 | 28 days supply | Qty: 4 | Fill #2

## 2020-03-30 ENCOUNTER — Ambulatory Visit: Payer: BC Managed Care – PPO | Admitting: Registered"

## 2020-04-10 ENCOUNTER — Other Ambulatory Visit: Payer: Self-pay | Admitting: Nurse Practitioner

## 2020-04-10 ENCOUNTER — Ambulatory Visit: Payer: Self-pay

## 2020-04-10 DIAGNOSIS — M810 Age-related osteoporosis without current pathological fracture: Secondary | ICD-10-CM

## 2020-04-10 MED ORDER — ALENDRONATE SODIUM 70 MG PO TABS: ORAL_TABLET | ORAL | 2 refills | Status: DC

## 2020-04-10 MED FILL — ALENDRONATE NA 70 MG TAB: 70 | 28 days supply | Qty: 4 | Fill #0

## 2020-04-10 NOTE — Telephone Encounter (Signed)
  Pt. Reports she thinks she hurt her left arm 2 weeks ago helping her sister. Hurts at the shoulder. Hurts to lift her arm. Given information for mobile unit location tomorrow. Verbalizes understanding. Reason for Disposition . [1] MODERATE pain (e.g., interferes with normal activities) AND [2] present > 3 days  Answer Assessment - Initial Assessment Questions 1. ONSET: "When did the pain start?"     2 weeks ago 2. LOCATION: "Where is the pain located?"     Shoulder 3. PAIN: "How bad is the pain?" (Scale 1-10; or mild, moderate, severe)   - MILD (1-3): doesn't interfere with normal activities   - MODERATE (4-7): interferes with normal activities (e.g., work or school) or awakens from sleep   - SEVERE (8-10): excruciating pain, unable to do any normal activities, unable to hold a cup of water     8 4. WORK OR EXERCISE: "Has there been any recent work or exercise that involved this part of the body?"     Yes 5. CAUSE: "What do you think is causing the arm pain?"     Moving things 6. OTHER SYMPTOMS: "Do you have any other symptoms?" (e.g., neck pain, swelling, rash, fever, numbness, weakness)     Pain 7. PREGNANCY: "Is there any chance you are pregnant?" "When was your last menstrual period?"     No  Protocols used: ARM PAIN-A-AH

## 2020-04-10 NOTE — Telephone Encounter (Signed)
Medication Refill - Medication: alendronate (FOSAMAX) 70 MG tablet   Pt is almost out   Has the patient contacted their pharmacy? Yes.   (Agent: If no, request that the patient contact the pharmacy for the refill.) (Agent: If yes, when and what did the pharmacy advise?)  Preferred Pharmacy (with phone number or street name):  [facility] - [place], [place] - [address]  [address] [place] [address]  Phone: [phone] Fax: [phone]     Agent: Please be advised that RX refills may take up to 3 business days. We ask that you follow-up with your pharmacy.

## 2020-04-14 NOTE — Telephone Encounter (Signed)
The note states " JUST FYI"

## 2020-04-19 ENCOUNTER — Ambulatory Visit: Payer: BC Managed Care – PPO | Admitting: Registered"

## 2020-04-23 ENCOUNTER — Other Ambulatory Visit: Payer: Self-pay | Admitting: Nurse Practitioner

## 2020-04-23 DIAGNOSIS — E1169 Type 2 diabetes mellitus with other specified complication: Secondary | ICD-10-CM

## 2020-04-23 MED ORDER — LISINOPRIL 2.5 MG PO TABS: 2.5000 mg | ORAL_TABLET | Freq: Every day | ORAL | 0 refills | Status: DC

## 2020-05-08 ENCOUNTER — Other Ambulatory Visit: Payer: Self-pay | Admitting: Nurse Practitioner

## 2020-05-08 DIAGNOSIS — E1165 Type 2 diabetes mellitus with hyperglycemia: Secondary | ICD-10-CM

## 2020-05-08 NOTE — Telephone Encounter (Signed)
Requested medication (s) are due for refill today: medication expired   Requested medication (s) are on the active medication list: yes   Last refill:  start: 08/31/19 end: 11/29/19 #90 1 refill  Future visit scheduled: yes in 1 month  Notes to clinic:  expired medication, last labs 01/29/2019. Do you want to renew Rx?     Requested Prescriptions  Pending Prescriptions Disp Refills   atorvastatin (LIPITOR) 40 MG tablet [Pharmacy Med Name: ATORVASTATIN CALCIUM 40 MG 40 Tablet] 90 tablet 1    Sig: Take 1 tablet (40 mg total) by mouth daily.      Cardiovascular:  Antilipid - Statins Failed - 05/08/2020  2:46 PM      Failed - Total Cholesterol in normal range and within 360 days    Cholesterol, Total  Date Value Ref Range Status  01/29/2019 127 100 - 199 mg/dL Final          Failed - LDL in normal range and within 360 days    LDL Chol Calc (NIH)  Date Value Ref Range Status  01/29/2019 55 0 - 99 mg/dL Final          Failed - HDL in normal range and within 360 days    HDL  Date Value Ref Range Status  01/29/2019 57 >39 mg/dL Final          Failed - Triglycerides in normal range and within 360 days    Triglycerides  Date Value Ref Range Status  01/29/2019 76 0 - 149 mg/dL Final          Passed - Patient is not pregnant      Passed - Valid encounter within last 12 months    Recent Outpatient Visits           5 months ago Type 2 diabetes mellitus with hyperglycemia, without long-term current use of insulin (HCC)   [place] [facility] [place], [place] W, NP   8 months ago Type 2 diabetes mellitus with hyperglycemia, without long-term current use of insulin [facility])   [place] [facility] [place], Shea Stakes, NP   11 months ago Left wrist pain   [place] [facility] [place], [place], [place]   1 year ago Type 2 diabetes mellitus with hyperglycemia, without long-term current use of insulin [facility])   [place] [facility] [place], Shea Stakes, NP   1 year ago Anxiety and depression   [facility] [place], Shea Stakes, NP       Future Appointments             In 1 month Claiborne Rigg, NP [facility]

## 2020-05-08 NOTE — Telephone Encounter (Signed)
Requested medication (s) are due for refill today: -  Requested medication (s) are on the active medication list: no  Last refill:  03/11/19  Future visit scheduled: yes  Notes to clinic:  historical med and provider-could not find a dc date   Requested Prescriptions  Pending Prescriptions Disp Refills   TRULICITY 1.5 MG/0.5ML SOPN [Pharmacy Med Name: TRULICITY 1.5 MG/0.5 ML PEN 1.5 Solution Pen-injector] 6 mL 0    Sig: INJECT 1.5 MG INTO THE SKIN ONCE A WEEK.      Endocrinology:  Diabetes - GLP-1 Receptor Agonists Failed - 05/08/2020 11:03 AM      Failed - HBA1C is between 0 and 7.9 and within 180 days    Hemoglobin A1C  Date Value Ref Range Status  11/26/2019 8.2 (A) 4.0 - 5.6 % Final   Hgb A1c MFr Bld  Date Value Ref Range Status  08/25/2019 8.8 (H) 4.8 - 5.6 % Final    Comment:             Prediabetes: 5.7 - 6.4          Diabetes: >6.4          Glycemic control for adults with diabetes: <7.0           Passed - Valid encounter within last 6 months    Recent Outpatient Visits           5 months ago Type 2 diabetes mellitus with hyperglycemia, without long-term current use of insulin [facility])   [place] [facility] [place], [place] W, NP   8 months ago Type 2 diabetes mellitus with hyperglycemia, without long-term current use of insulin [facility])   [place] [facility] [place], Shea Stakes, NP   11 months ago Left wrist pain   [place] [facility] [place], [place], [place]   1 year ago Type 2 diabetes mellitus with hyperglycemia, without long-term current use of insulin [facility])    [facility] [place], Shea Stakes, NP   1 year ago Anxiety and depression   [facility] [place], Shea Stakes, NP       Future Appointments             In 1 month Claiborne Rigg, NP [facility]

## 2020-05-09 ENCOUNTER — Other Ambulatory Visit: Payer: Self-pay | Admitting: Family Medicine

## 2020-05-09 MED FILL — TRULICITY 1.5 MG/0.5 ML PEN: 1.5 | 28 days supply | Qty: 2 | Fill #0

## 2020-05-10 ENCOUNTER — Other Ambulatory Visit: Payer: Self-pay | Admitting: Nurse Practitioner

## 2020-05-10 MED FILL — ALENDRONATE NA 70 MG TAB: 70 | 28 days supply | Qty: 4 | Fill #1

## 2020-05-11 MED FILL — ATORVASTATIN CALCIUM 40 MG: 40 | 90 days supply | Qty: 90 | Fill #0

## 2020-06-06 ENCOUNTER — Other Ambulatory Visit: Payer: Self-pay | Admitting: Nurse Practitioner

## 2020-06-06 ENCOUNTER — Ambulatory Visit: Payer: Medicare HMO | Admitting: Podiatry

## 2020-06-08 ENCOUNTER — Other Ambulatory Visit: Payer: Self-pay | Admitting: Nurse Practitioner

## 2020-06-08 MED ORDER — TRULICITY 1.5 MG/0.5ML ~~LOC~~ SOAJ
1.5000 mg | SUBCUTANEOUS | 0 refills | Status: DC
Start: 2020-06-08 — End: 2020-07-04

## 2020-06-08 NOTE — Telephone Encounter (Signed)
Requested Prescriptions  Pending Prescriptions Disp Refills   Dulaglutide (TRULICITY) 1.5 MG/0.5ML SOPN 2 mL 0    Sig: Inject 1.5 mg into the skin once a week.     Endocrinology:  Diabetes - GLP-1 Receptor Agonists Failed - 06/08/2020  1:53 PM      Failed - HBA1C is between 0 and 7.9 and within 180 days    Hemoglobin A1C  Date Value Ref Range Status  11/26/2019 8.2 (A) 4.0 - 5.6 % Final   Hgb A1c MFr Bld  Date Value Ref Range Status  08/25/2019 8.8 (H) 4.8 - 5.6 % Final    Comment:             Prediabetes: 5.7 - 6.4          Diabetes: >6.4          Glycemic control for adults with diabetes: <7.0          Failed - Valid encounter within last 6 months    Recent Outpatient Visits          6 months ago Type 2 diabetes mellitus with hyperglycemia, without long-term current use of insulin [facility])   [facility] [place], [place] W, NP   9 months ago Type 2 diabetes mellitus with hyperglycemia, without long-term current use of insulin [place])   [place] [facility] [place], Shea Stakes, NP   1 year ago Left wrist pain   [place] [facility] [place], [place], [place]   1 year ago Type 2 diabetes mellitus with hyperglycemia, without long-term current use of insulin [facility])   [facility] [place], Shea Stakes, NP   1 year ago Anxiety and depression   [facility] [place], Shea Stakes, NP      Future Appointments            In 6 days Claiborne Rigg, NP [employer] [facility]

## 2020-06-08 NOTE — Telephone Encounter (Signed)
Requested Prescriptions  °Pending Prescriptions Disp Refills  °• Dulaglutide (TRULICITY) 1.5 MG/0.5ML SOPN 2 mL 0  °  Sig: Inject 1.5 mg into the skin once a week.  °  ° Endocrinology:  Diabetes - GLP-1 Receptor Agonists Failed - 06/08/2020  1:53 PM  °  °  Failed - HBA1C is between 0 and 7.9 and within 180 days  °  Hemoglobin A1C  °Date Value Ref Range Status  °11/26/2019 8.2 (A) 4.0 - 5.6 % Final  ° °Hgb A1c MFr Bld  °Date Value Ref Range Status  °08/25/2019 8.8 (H) 4.8 - 5.6 % Final  °  Comment:  °           Prediabetes: 5.7 - 6.4 °         Diabetes: >6.4 °         Glycemic control for adults with diabetes: <7.0 °  °   °  °  Failed - Valid encounter within last 6 months  °  Recent Outpatient Visits   °      ° 6 months ago Type 2 diabetes mellitus with hyperglycemia, without long-term current use of insulin (HCC)  ° [place] [facility] [place] Hines, Ana W, NP  ° 9 months ago Type 2 diabetes mellitus with hyperglycemia, without long-term current use of insulin (HCC)  ° [place] [facility] [place] Hines, Ana W, NP  ° 1 year ago Left wrist pain  ° [place] [facility] [place], Angela M, PA-C  ° 1 year ago Type 2 diabetes mellitus with hyperglycemia, without long-term current use of insulin (HCC)  ° [place] [facility] [place] Hines, Ana W, NP  ° 1 year ago Anxiety and depression  ° [place] [facility] Ana Hines, Ana W, NP  °  °  °Future Appointments   °        ° In 6 days Ana Hines, Ana W, NP [place] [facility]  °  ° °  °  °  ° °

## 2020-06-08 NOTE — Telephone Encounter (Signed)
Copied from CRM [id number]. Topic: Quick Communication - Rx Refill/Question >> Jun 08, 2020  1:39 PM Gaetana Michaelis A wrote: Medication: TRULICITY 1.5 MG/0.5ML SOPN   Has the patient contacted their pharmacy? Yes. Patient has spoken with pharmacy and they have been unable to contact PCP  Preferred Pharmacy (with phone number or street name): [id number] - [address], [place] - [address]  Phone:  [phone]  Agent: Please be advised that RX refills may take up to 3 business days. We ask that you follow-up with your pharmacy.

## 2020-06-14 ENCOUNTER — Ambulatory Visit: Payer: BC Managed Care – PPO | Admitting: Nurse Practitioner

## 2020-06-19 ENCOUNTER — Other Ambulatory Visit: Payer: BC Managed Care – PPO

## 2020-06-19 ENCOUNTER — Ambulatory Visit: Payer: BC Managed Care – PPO

## 2020-07-04 ENCOUNTER — Other Ambulatory Visit: Payer: Self-pay | Admitting: Family Medicine

## 2020-07-04 NOTE — Telephone Encounter (Signed)
Requested medication (s) are due for refill today: yes  Requested medication (s) are on the active medication list: yes  Last refill: 06/11/2020  Future visit scheduled: yes  Notes to clinic:  Patient has appointment schedule for 07/31/2020 Review for refill until then  Appt on 06/19/2020 was canceled   Requested Prescriptions  Pending Prescriptions Disp Refills   TRULICITY 1.5 MG/0.5ML SOPN [Pharmacy Med Name: Trulicity 1.5 MG/0.5ML Subcutaneous Solution Pen-injector] 4 mL 0    Sig: INJECT 1.5MG    SUBCUTANEOUSLY ONCE A WEEK AS DIRECTED      Endocrinology:  Diabetes - GLP-1 Receptor Agonists Failed - 07/04/2020  1:16 PM      Failed - HBA1C is between 0 and 7.9 and within 180 days    Hemoglobin A1C  Date Value Ref Range Status  11/26/2019 8.2 (A) 4.0 - 5.6 % Final   Hgb A1c MFr Bld  Date Value Ref Range Status  08/25/2019 8.8 (H) 4.8 - 5.6 % Final    Comment:             Prediabetes: 5.7 - 6.4          Diabetes: >6.4          Glycemic control for adults with diabetes: <7.0           Failed - Valid encounter within last 6 months    Recent Outpatient Visits           7 months ago Type 2 diabetes mellitus with hyperglycemia, without long-term current use of insulin (HCC)   [place] [facility] [place], [place] W, NP   10 months ago Type 2 diabetes mellitus with hyperglycemia, without long-term current use of insulin (HCC)   [place] [facility] [place], Shea Stakes, NP   1 year ago Left wrist pain   [place] [facility]ness [place], [place], [place]   1 year ago Type 2 diabetes mellitus with hyperglycemia, without long-term current use of insulin [facility])   [place] [facility] [place], Shea Stakes, NP   1 year ago Anxiety and depression   [place] [facility] [place], Shea Stakes, NP       Future Appointments             In 3 weeks Claiborne Rigg, NP [facility] [employer] And  Wellness

## 2020-07-07 ENCOUNTER — Other Ambulatory Visit: Payer: Self-pay | Admitting: Nurse Practitioner

## 2020-07-07 DIAGNOSIS — F419 Anxiety disorder, unspecified: Secondary | ICD-10-CM

## 2020-07-07 DIAGNOSIS — F32A Depression, unspecified: Secondary | ICD-10-CM

## 2020-07-07 NOTE — Telephone Encounter (Signed)
Requested medication (s) are due for refill today: expired medication  Requested medication (s) are on the active medication list: yes  Last refill:  08/31/19- 11/29/19 #90 1 refill  Future visit scheduled: yes in 3 weeks   Notes to clinic:  expired medication. Do you want to renew Rx?     Requested Prescriptions  Pending Prescriptions Disp Refills   mirtazapine (REMERON) 30 MG tablet [Pharmacy Med Name: Mirtazapine 30 MG Oral Tablet] 45 tablet 0    Sig: TAKE 1/2 (ONE-HALF) TABLET BY MOUTH EVERY DAY AT BEDTIME      Psychiatry: Antidepressants - mirtazapine Failed - 07/07/2020 12:05 PM      Failed - Triglycerides in normal range and within 360 days    Triglycerides  Date Value Ref Range Status  01/29/2019 76 0 - 149 mg/dL Final          Failed - Total Cholesterol in normal range and within 360 days    Cholesterol, Total  Date Value Ref Range Status  01/29/2019 127 100 - 199 mg/dL Final          Failed - WBC in normal range and within 360 days    WBC  Date Value Ref Range Status  11/26/2019 11.1 (H) 3.4 - 10.8 x10E3/uL Final  04/23/2016 9.5 4.0 - 10.5 K/uL Final          Failed - Valid encounter within last 6 months    Recent Outpatient Visits           7 months ago Type 2 diabetes mellitus with hyperglycemia, without long-term current use of insulin [facility])   [place] [facility] Wellness [place], [place] W, NP   10 months ago Type 2 diabetes mellitus with hyperglycemia, without long-term current use of insulin [facility])   [place] [facility]ness [place], Shea Stakes, NP   1 year ago Left wrist pain   [place] [facility] And Wellness [place], [place], [place]   1 year ago Type 2 diabetes mellitus with hyperglycemia, without long-term current use of insulin [facility])   [place] [facility] Wellness [place], [place] W, NP   2 years ago Anxiety and depression   [place] [address] And Wellness [place], Shea Stakes, NP       Future  Appointments             In 3 weeks Claiborne Rigg, NP [place] [facility]             Passed - AST in normal range and within 360 days    AST  Date Value Ref Range Status  11/26/2019 39 0 - 40 IU/L Final          Passed - ALT in normal range and within 360 days    ALT  Date Value Ref Range Status  11/26/2019 29 0 - 32 IU/L Final

## 2020-07-31 ENCOUNTER — Ambulatory Visit: Payer: BC Managed Care – PPO | Admitting: Nurse Practitioner

## 2020-07-31 ENCOUNTER — Other Ambulatory Visit: Payer: Self-pay | Admitting: Family Medicine

## 2020-07-31 NOTE — Telephone Encounter (Signed)
Requested medications are due for refill today yes  Requested medications are on the active medication list yes  Last refill 3/19 (mail order)  Last visit 11/2019  Future visit scheduled 09/2020  Notes to clinic Was given 71ml 3/15, had appt today (4/11) pt canceled and rescheduled for 09/2020, please assess.

## 2020-08-11 ENCOUNTER — Other Ambulatory Visit: Payer: Self-pay | Admitting: Nurse Practitioner

## 2020-08-11 DIAGNOSIS — M810 Age-related osteoporosis without current pathological fracture: Secondary | ICD-10-CM

## 2020-08-11 MED ORDER — ALENDRONATE SODIUM 70 MG PO TABS: ORAL_TABLET | ORAL | 2 refills | Status: DC

## 2020-08-11 NOTE — Telephone Encounter (Signed)
Medication Refill - Medication: alendronate (FOSAMAX) 70 MG tablet    Has the patient contacted their pharmacy? Yes.   (Agent: If no, request that the patient contact the pharmacy for the refill.) (Agent: If yes, when and what did the pharmacy advise?)  Preferred Pharmacy (with phone number or street name):  [facility] [address] [place], [place] - [address] Phone:  [phone]  Fax:  [phone]       Agent: Please be advised that RX refills may take up to 3 business days. We ask that you follow-up with your pharmacy.

## 2020-08-16 DIAGNOSIS — E119 Type 2 diabetes mellitus without complications: Secondary | ICD-10-CM | POA: Diagnosis not present

## 2020-08-16 DIAGNOSIS — H5203 Hypermetropia, bilateral: Secondary | ICD-10-CM | POA: Diagnosis not present

## 2020-08-16 DIAGNOSIS — H43393 Other vitreous opacities, bilateral: Secondary | ICD-10-CM | POA: Diagnosis not present

## 2020-08-16 DIAGNOSIS — Z961 Presence of intraocular lens: Secondary | ICD-10-CM | POA: Diagnosis not present

## 2020-08-16 DIAGNOSIS — H524 Presbyopia: Secondary | ICD-10-CM | POA: Diagnosis not present

## 2020-08-17 ENCOUNTER — Ambulatory Visit: Payer: BC Managed Care – PPO

## 2020-08-17 ENCOUNTER — Other Ambulatory Visit: Payer: BC Managed Care – PPO

## 2020-08-18 ENCOUNTER — Other Ambulatory Visit: Payer: Self-pay

## 2020-08-18 MED FILL — Atorvastatin Calcium Tab 40 MG (Base Equivalent): ORAL | 90 days supply | Qty: 90 | Fill #0 | Status: CN

## 2020-08-22 ENCOUNTER — Inpatient Hospital Stay: Admission: RE | Admit: 2020-08-22 | Payer: Medicare HMO | Source: Ambulatory Visit

## 2020-08-25 ENCOUNTER — Other Ambulatory Visit: Payer: Self-pay

## 2020-08-25 MED FILL — Atorvastatin Calcium Tab 40 MG (Base Equivalent): ORAL | 90 days supply | Qty: 90 | Fill #0 | Status: AC

## 2020-09-13 ENCOUNTER — Other Ambulatory Visit: Payer: Self-pay | Admitting: Family Medicine

## 2020-09-13 NOTE — Telephone Encounter (Signed)
Courtesy refill. Pt has appt. 10/04/20

## 2020-09-19 ENCOUNTER — Other Ambulatory Visit: Payer: Self-pay | Admitting: Family Medicine

## 2020-09-19 ENCOUNTER — Other Ambulatory Visit: Payer: Self-pay

## 2020-09-19 ENCOUNTER — Other Ambulatory Visit: Payer: Self-pay | Admitting: Nurse Practitioner

## 2020-09-19 NOTE — Telephone Encounter (Signed)
  Notes to clinic:  Patient has appt scheduled for 10/04/2020 Review for another refill  until that time    Requested Prescriptions  Pending Prescriptions Disp Refills   Dulaglutide (TRULICITY) 1.5 MG/0.5ML SOPN 4 mL 0    Sig: INJECT 1.5MG   SUBCUTANEOUSLY ONCE A WEEK . APPOINTMENT REQUIRED FOR FUTURE REFILLS      Endocrinology:  Diabetes - GLP-1 Receptor Agonists Failed - 09/19/2020 11:03 AM      Failed - HBA1C is between 0 and 7.9 and within 180 days    Hemoglobin A1C  Date Value Ref Range Status  11/26/2019 8.2 (A) 4.0 - 5.6 % Final   Hgb A1c MFr Bld  Date Value Ref Range Status  08/25/2019 8.8 (H) 4.8 - 5.6 % Final    Comment:             Prediabetes: 5.7 - 6.4          Diabetes: >6.4          Glycemic control for adults with diabetes: <7.0           Failed - Valid encounter within last 6 months    Recent Outpatient Visits           9 months ago Type 2 diabetes mellitus with hyperglycemia, without long-term current use of insulin [facility])   [place] [facility] Wellness [place], [place] W, NP   1 year ago Type 2 diabetes mellitus with hyperglycemia, without long-term current use of insulin [facility])   [place] [facility] Wellness [place], Shea Stakes, NP   1 year ago Left wrist pain   [place] [facility] Wellness [place], [place], [place]   1 year ago Type 2 diabetes mellitus with hyperglycemia, without long-term current use of insulin [facility])   [place] [facility] Wellness [place], Shea Stakes, NP   2 years ago Anxiety and depression   [facility] [place], Shea Stakes, NP       Future Appointments             In 2 weeks Claiborne Rigg, NP [employer] And Wellness

## 2020-09-20 ENCOUNTER — Other Ambulatory Visit: Payer: Self-pay

## 2020-09-20 MED ORDER — TRULICITY 1.5 MG/0.5ML ~~LOC~~ SOAJ
SUBCUTANEOUS | 0 refills | Status: DC
Start: 2020-09-20 — End: 2020-10-18
  Filled 2020-09-20: qty 2, 28d supply, fill #0

## 2020-09-21 ENCOUNTER — Other Ambulatory Visit: Payer: Self-pay

## 2020-10-04 ENCOUNTER — Other Ambulatory Visit: Payer: Self-pay

## 2020-10-04 ENCOUNTER — Ambulatory Visit: Payer: Medicare HMO | Attending: Nurse Practitioner | Admitting: Nurse Practitioner

## 2020-10-04 ENCOUNTER — Encounter: Payer: Self-pay | Admitting: Nurse Practitioner

## 2020-10-04 VITALS — BP 115/66 | HR 90 | Ht 69.0 in | Wt 94.4 lb

## 2020-10-04 DIAGNOSIS — E1169 Type 2 diabetes mellitus with other specified complication: Secondary | ICD-10-CM | POA: Diagnosis not present

## 2020-10-04 DIAGNOSIS — E785 Hyperlipidemia, unspecified: Secondary | ICD-10-CM

## 2020-10-04 DIAGNOSIS — D72829 Elevated white blood cell count, unspecified: Secondary | ICD-10-CM

## 2020-10-04 LAB — POCT GLYCOSYLATED HEMOGLOBIN (HGB A1C): HbA1c, POC (controlled diabetic range): 9.4 % — AB (ref 0.0–7.0)

## 2020-10-04 LAB — GLUCOSE, POCT (MANUAL RESULT ENTRY): POC Glucose: 227 mg/dl — AB (ref 70–99)

## 2020-10-04 MED ORDER — GLIPIZIDE 5 MG PO TABS
5.0000 mg | ORAL_TABLET | Freq: Two times a day (BID) | ORAL | 1 refills | Status: DC
  Filled 2020-10-04: qty 60, 30d supply, fill #0
  Filled 2020-11-15: qty 60, 30d supply, fill #1
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
## 2020-10-05 LAB — CBC WITH DIFFERENTIAL/PLATELET
Basophils Absolute: 0 10*3/uL (ref 0.0–0.2)
Basos: 0 %
EOS (ABSOLUTE): 0.1 10*3/uL (ref 0.0–0.4)
Eos: 1 %
Hematocrit: 37.1 % (ref 34.0–46.6)
Hemoglobin: 12.5 g/dL (ref 11.1–15.9)
Immature Grans (Abs): 0 10*3/uL (ref 0.0–0.1)
Immature Granulocytes: 0 %
Lymphocytes Absolute: 2.4 10*3/uL (ref 0.7–3.1)
Lymphs: 25 %
MCH: 33.1 pg — ABNORMAL HIGH (ref 26.6–33.0)
MCHC: 33.7 g/dL (ref 31.5–35.7)
MCV: 98 fL — ABNORMAL HIGH (ref 79–97)
Monocytes Absolute: 0.7 10*3/uL (ref 0.1–0.9)
Monocytes: 8 %
Neutrophils Absolute: 6.3 10*3/uL (ref 1.4–7.0)
Neutrophils: 66 %
Platelets: 257 10*3/uL (ref 150–450)
RBC: 3.78 x10E6/uL (ref 3.77–5.28)
RDW: 11.7 % (ref 11.7–15.4)
WBC: 9.6 10*3/uL (ref 3.4–10.8)

## 2020-10-05 LAB — LIPID PANEL
Chol/HDL Ratio: 2.5 ratio (ref 0.0–4.4)
Cholesterol, Total: 132 mg/dL (ref 100–199)
HDL: 53 mg/dL (ref >39)
LDL Chol Calc (NIH): 63 mg/dL (ref 0–99)
Triglycerides: 84 mg/dL (ref 0–149)
VLDL Cholesterol Cal: 16 mg/dL (ref 5–40)

## 2020-10-05 LAB — CMP14+EGFR
ALT: 25 IU/L (ref 0–32)
AST: 24 IU/L (ref 0–40)
Albumin/Globulin Ratio: 1.6 (ref 1.2–2.2)
Albumin: 4.4 g/dL (ref 3.8–4.8)
Alkaline Phosphatase: 89 IU/L (ref 44–121)
BUN/Creatinine Ratio: 12 (ref 12–28)
BUN: 8 mg/dL (ref 8–27)
Bilirubin Total: <0.2 mg/dL (ref 0.0–1.2)
CO2: 22 mmol/L (ref 20–29)
Calcium: 10.2 mg/dL (ref 8.7–10.3)
Chloride: 99 mmol/L (ref 96–106)
Creatinine, Ser: 0.69 mg/dL (ref 0.57–1.00)
Globulin, Total: 2.8 g/dL (ref 1.5–4.5)
Glucose: 219 mg/dL — ABNORMAL HIGH (ref 65–99)
Potassium: 4.6 mmol/L (ref 3.5–5.2)
Sodium: 139 mmol/L (ref 134–144)
Total Protein: 7.2 g/dL (ref 6.0–8.5)
eGFR: 94 mL/min/{1.73_m2} (ref >59)

## 2020-10-18 ENCOUNTER — Other Ambulatory Visit: Payer: Self-pay | Admitting: Family Medicine

## 2020-10-18 ENCOUNTER — Other Ambulatory Visit: Payer: Self-pay

## 2020-10-18 MED ORDER — TRULICITY 1.5 MG/0.5ML ~~LOC~~ SOAJ
SUBCUTANEOUS | 2 refills | Status: DC
Start: 2020-10-18 — End: 2020-12-26
  Filled 2020-10-18: qty 2, 28d supply, fill #0
  Filled 2020-11-15: qty 2, 28d supply, fill #1

## 2020-10-18 NOTE — Telephone Encounter (Signed)
Requested medication (s) are due for refill today: Yes  Requested medication (s) are on the active medication list: Yes  Last refill:  09/20/20  Future visit scheduled: Yes  Notes to clinic:  Pharmacy highlighted notes on Rx, unable to refill     Requested Prescriptions  Pending Prescriptions Disp Refills   Dulaglutide (TRULICITY) 1.5 MG/0.5ML SOPN 2 mL 0      Endocrinology:  Diabetes - GLP-1 Receptor Agonists Failed - 10/18/2020  2:32 PM      Failed - HBA1C is between 0 and 7.9 and within 180 days    HbA1c, POC (controlled diabetic range)  Date Value Ref Range Status  10/04/2020 9.4 (A) 0.0 - 7.0 % Final          Passed - Valid encounter within last 6 months    Recent Outpatient Visits           2 weeks ago Type 2 diabetes mellitus with other specified complication, unspecified whether long term insulin use (HCC)   [place] [facility] Wellness [place], [place] W, NP   10 months ago Type 2 diabetes mellitus with hyperglycemia, without long-term current use of insulin [facility])   [place] [facility] Well[facility] [place], [place] W, NP   1 year ago Type 2 diabetes mellitus with hyperglycemia, without long-term current use of insulin [facility])   [place] [facility] Wellness [place], Shea Stakes, NP   1 year ago Left wrist pain   [place] [facility] [place], [place], [place]   1 year ago Type 2 diabetes mellitus with hyperglycemia, without long-term current use of insulin [facility])   [place] [facility]ness [place], Shea Stakes, NP       Future Appointments             Tomorrow  Post-COVID Care [facility] at [place]   In 4 weeks Lois Huxley, Cornelius Moras, RPH-CPP [facility]   In 2 months Claiborne Rigg, NP [facility] [employer] And Wellness

## 2020-10-19 ENCOUNTER — Ambulatory Visit (INDEPENDENT_AMBULATORY_CARE_PROVIDER_SITE_OTHER): Payer: Medicare HMO | Admitting: Nurse Practitioner

## 2020-10-19 ENCOUNTER — Other Ambulatory Visit: Payer: Self-pay

## 2020-10-19 VITALS — BP 139/66 | HR 83 | Temp 97.9°F | Resp 18 | Ht 69.0 in | Wt 94.0 lb

## 2020-10-19 DIAGNOSIS — Z0000 Encounter for general adult medical examination without abnormal findings: Secondary | ICD-10-CM

## 2020-10-19 NOTE — Patient Instructions (Addendum)
[name] , Thank you for taking time to come for your Medicare Wellness Visit. I appreciate your ongoing commitment to your health goals. Please review the following plan we discussed and let me know if I can assist you in the future.   These are the goals we discussed:  Goals     . Have 3 meals a day    . HEMOGLOBIN A1C < 7.0        This is a list of the screening recommended for you and due dates:  Health Maintenance  Topic Date Due  . Zoster (Shingles) Vaccine (1 of 2) Never done  . Tetanus Vaccine  04/22/2005  . COVID-19 Vaccine (3 - Pfizer risk series) 10/14/2019  . Eye exam for diabetics  02/25/2020  . Flu Shot  11/20/2020  . Complete foot exam   02/28/2021  . Mammogram  03/25/2021  . Hemoglobin A1C  04/05/2021  . Colon Cancer Screening  11/25/2027  . DEXA scan (bone density measurement)  Completed  . Hepatitis C Screening: USPSTF Recommendation to screen - Ages 79-79 yo.  Completed  . Pneumonia vaccines  Completed  . HPV Vaccine  Aged Out    Fall Prevention in the Home, Adult Falls can cause injuries and can happen to people of all ages. There are many things you can do to make your home safe and to help prevent falls. Ask forhelp when making these changes. What actions can I take to prevent falls? General Instructions Use good lighting in all rooms. Replace any light bulbs that burn out. Turn on the lights in dark areas. Use night-lights. Keep items that you use often in easy-to-reach places. Lower the shelves around your home if needed. Set up your furniture so you have a clear path. Avoid moving your furniture around. Do not have throw rugs or other things on the floor that can make you trip. Avoid walking on wet floors. If any of your floors are uneven, fix them. Add color or contrast paint or tape to clearly mark and help you see: Grab bars or handrails. First and last steps of staircases. Where the edge of each step is. If you use a stepladder: Make sure  that it is fully opened. Do not climb a closed stepladder. Make sure the sides of the stepladder are locked in place. Ask someone to hold the stepladder while you use it. Know where your pets are when moving through your home. What can I do in the bathroom?     Keep the floor dry. Clean up any water on the floor right away. Remove soap buildup in the tub or shower. Use nonskid mats or decals on the floor of the tub or shower. Attach bath mats securely with double-sided, nonslip rug tape. If you need to sit down in the shower, use a plastic, nonslip stool. Install grab bars by the toilet and in the tub and shower. Do not use towel bars as grab bars. What can I do in the bedroom? Make sure that you have a light by your bed that is easy to reach. Do not use any sheets or blankets for your bed that hang to the floor. Have a firm chair with side arms that you can use for support when you get dressed. What can I do in the kitchen? Clean up any spills right away. If you need to reach something above you, use a step stool with a grab bar. Keep electrical cords out of the way. Do not  use floor polish or wax that makes floors slippery. What can I do with my stairs? Do not leave any items on the stairs. Make sure that you have a light switch at the top and the bottom of the stairs. Make sure that there are handrails on both sides of the stairs. Fix handrails that are broken or loose. Install nonslip stair treads on all your stairs. Avoid having throw rugs at the top or bottom of the stairs. Choose a carpet that does not hide the edge of the steps on the stairs. Check carpeting to make sure that it is firmly attached to the stairs. Fix carpet that is loose or worn. What can I do on the outside of my home? Use bright outdoor lighting. Fix the edges of walkways and driveways and fix any cracks. Remove anything that might make you trip as you walk through a door, such as a raised step or  threshold. Trim any bushes or trees on paths to your home. Check to see if handrails are loose or broken and that both sides of all steps have handrails. Install guardrails along the edges of any raised decks and porches. Clear paths of anything that can make you trip, such as tools or rocks. Have leaves, snow, or ice cleared regularly. Use sand or salt on paths during winter. Clean up any spills in your garage right away. This includes grease or oil spills. What other actions can I take? Wear shoes that: Have a low heel. Do not wear high heels. Have rubber bottoms. Feel good on your feet and fit well. Are closed at the toe. Do not wear open-toe sandals. Use tools that help you move around if needed. These include: Canes. Walkers. Scooters. Crutches. Review your medicines with your doctor. Some medicines can make you feel dizzy. This can increase your chance of falling. Ask your doctor what else you can do to help prevent falls. Where to find more information Centers for Disease Control and Prevention, [employer]: FootballExhibition.com.br [employer] on Aging: https://walker.com/ Contact a doctor if: You are afraid of falling at home. You feel weak, drowsy, or dizzy at home. You fall at home. Summary There are many simple things that you can do to make your home safe and to help prevent falls. Ways to make your home safe include removing things that can make you trip and installing grab bars in the bathroom. Ask for help when making these changes in your home. This information is not intended to replace advice given to you by your health care provider. Make sure you discuss any questions you have with your healthcare provider. Document Revised: 11/10/2019 Document Reviewed: 11/10/2019 Elsevier Patient Education  2022 Elsevier Inc.  Health Maintenance, Female Adopting a healthy lifestyle and getting preventive care are important in promoting health and wellness. Ask your health care provider  about: The right schedule for you to have regular tests and exams. Things you can do on your own to prevent diseases and keep yourself healthy. What should I know about diet, weight, and exercise? Eat a healthy diet  Eat a diet that includes plenty of vegetables, fruits, low-fat dairy products, and lean protein. Do not eat a lot of foods that are high in solid fats, added sugars, or sodium.  Maintain a healthy weight Body mass index (BMI) is used to identify weight problems. It estimates body fat based on height and weight. Your health care provider can help determineyour BMI and help you achieve or maintain a healthy  weight. Get regular exercise Get regular exercise. This is one of the most important things you can do for your health. Most adults should: Exercise for at least 150 minutes each week. The exercise should increase your heart rate and make you sweat (moderate-intensity exercise). Do strengthening exercises at least twice a week. This is in addition to the moderate-intensity exercise. Spend less time sitting. Even light physical activity can be beneficial. Watch cholesterol and blood lipids Have your blood tested for lipids and cholesterol at 69 years of age, then havethis test every 5 years. Have your cholesterol levels checked more often if: Your lipid or cholesterol levels are high. You are older than 69 years of age. You are at high risk for heart disease. What should I know about cancer screening? Depending on your health history and family history, you may need to have cancer screening at various ages. This may include screening for: Breast cancer. Cervical cancer. Colorectal cancer. Skin cancer. Lung cancer. What should I know about heart disease, diabetes, and high blood pressure? Blood pressure and heart disease High blood pressure causes heart disease and increases the risk of stroke. This is more likely to develop in people who have high blood pressure readings,  are of African descent, or are overweight. Have your blood pressure checked: Every 3-5 years if you are 4118-69 years of age. Every year if you are 69 years old or older. Diabetes Have regular diabetes screenings. This checks your fasting blood sugar level. Have the screening done: Once every three years after age 69 if you are at a normal weight and have a low risk for diabetes. More often and at a younger age if you are overweight or have a high risk for diabetes. What should I know about preventing infection? Hepatitis B If you have a higher risk for hepatitis B, you should be screened for this virus. Talk with your health care provider to find out if you are at risk forhepatitis B infection. Hepatitis C Testing is recommended for: Everyone born from [id number] through 1965. Anyone with known risk factors for hepatitis C. Sexually transmitted infections (STIs) Get screened for STIs, including gonorrhea and chlamydia, if: You are sexually active and are younger than 69 years of age. You are older than 69 years of age and your health care provider tells you that you are at risk for this type of infection. Your sexual activity has changed since you were last screened, and you are at increased risk for chlamydia or gonorrhea. Ask your health care provider if you are at risk. Ask your health care provider about whether you are at high risk for HIV. Your health care provider may recommend a prescription medicine to help prevent HIV infection. If you choose to take medicine to prevent HIV, you should first get tested for HIV. You should then be tested every 3 months for as long as you are taking the medicine. Pregnancy If you are about to stop having your period (premenopausal) and you may become pregnant, seek counseling before you get pregnant. Take 400 to 800 micrograms (mcg) of folic acid every day if you become pregnant. Ask for birth control (contraception) if you want to prevent  pregnancy. Osteoporosis and menopause Osteoporosis is a disease in which the bones lose minerals and strength with aging. This can result in bone fractures. If you are 329 years old or older, or if you are at risk for osteoporosis and fractures, ask your health care provider if you should: Be  screened for bone loss. Take a calcium or vitamin D supplement to lower your risk of fractures. Be given hormone replacement therapy (HRT) to treat symptoms of menopause. Follow these instructions at home: Lifestyle Do not use any products that contain nicotine or tobacco, such as cigarettes, e-cigarettes, and chewing tobacco. If you need help quitting, ask your health care provider. Do not use street drugs. Do not share needles. Ask your health care provider for help if you need support or information about quitting drugs. Alcohol use Do not drink alcohol if: Your health care provider tells you not to drink. You are pregnant, may be pregnant, or are planning to become pregnant. If you drink alcohol: Limit how much you use to 0-1 drink a day. Limit intake if you are breastfeeding. Be aware of how much alcohol is in your drink. In the [place] one drink equals one 12 oz bottle of beer (355 mL), one 5 oz glass of wine (148 mL), or one 1 oz glass of hard liquor (44 mL). General instructions Schedule regular health, dental, and eye exams. Stay current with your vaccines. Tell your health care provider if: You often feel depressed. You have ever been abused or do not feel safe at home. Summary Adopting a healthy lifestyle and getting preventive care are important in promoting health and wellness. Follow your health care provider's instructions about healthy diet, exercising, and getting tested or screened for diseases. Follow your health care provider's instructions on monitoring your cholesterol and blood pressure. This information is not intended to replace advice given to you by your health care  provider. Make sure you discuss any questions you have with your healthcare provider. Document Revised: 04/01/2018 Document Reviewed: 04/01/2018 Elsevier Patient Education  2022 [employer].
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
## 2020-10-20 ENCOUNTER — Other Ambulatory Visit: Payer: Self-pay

## 2020-11-13 ENCOUNTER — Other Ambulatory Visit: Payer: Self-pay

## 2020-11-13 ENCOUNTER — Encounter: Payer: Self-pay | Admitting: Podiatry

## 2020-11-13 ENCOUNTER — Ambulatory Visit (INDEPENDENT_AMBULATORY_CARE_PROVIDER_SITE_OTHER): Payer: Medicare HMO | Admitting: Podiatry

## 2020-11-13 DIAGNOSIS — M79675 Pain in left toe(s): Secondary | ICD-10-CM

## 2020-11-13 DIAGNOSIS — I739 Peripheral vascular disease, unspecified: Secondary | ICD-10-CM

## 2020-11-13 DIAGNOSIS — B351 Tinea unguium: Secondary | ICD-10-CM | POA: Diagnosis not present

## 2020-11-13 DIAGNOSIS — E119 Type 2 diabetes mellitus without complications: Secondary | ICD-10-CM | POA: Diagnosis not present

## 2020-11-13 DIAGNOSIS — R0989 Other specified symptoms and signs involving the circulatory and respiratory systems: Secondary | ICD-10-CM | POA: Diagnosis not present

## 2020-11-13 DIAGNOSIS — M79674 Pain in right toe(s): Secondary | ICD-10-CM | POA: Diagnosis not present

## 2020-11-13 NOTE — Progress Notes (Signed)
S:     PCP: Bertram Denver, NP  Patient arrives in good spirits.  Presents for diabetes evaluation, education, and management. Patient was referred and last seen by Primary Care Provider on 10/04/20. At that visit, POCT BG was 227 and A1c 9.4 (up from 8.2 in August 2021). She refused to start insulin. PCP was hesitant to increase dose of Trulicity d/t BMI 13.94 so resumed glipizide.   Today, patient has forgotten to bring her glucometer with her today. However, she has not started checking her BG since starting glipizide. She attributes this to being out of the habit of checking her BG and to stressors going on with her family members. She has been taking glipizide twice daily and Trulicity weekly and reports she missed 1 week of doses when she went on vacation and forgot to bring her medications at the beginning of July, but she has not missed any doses since. Denies dizziness, lightheadedness, shakiness.   Family/Social History:  -Fhx: nonpertinent -Tobacco: smokes daily  Insurance coverage/medication affordability: Medicare  Medication adherence reported: no missed doses in last 3 weeks.   Current diabetes medications include: Trulicity 1.5 mg weekly, glipizide 5 mg BIDAC Current hypertension medications include: lisinopril 2.5 mg daily Current hyperlipidemia medications include: atorvastatin 40 mg daily  Patient denies hypoglycemic events.  Patient reported dietary habits: Eats ~2 meals/day. Usually only 1 big meal and 1 small meal.  At breakfast around 10:45am: bacon, fried green tomatoes, fried egg, toast with butter, coffee; 10:45am  Patient-reported exercise habits: walks around the block 3 times every morning, tries to walk briskly   Patient denies nocturia (nighttime urination).  Patient denies neuropathy (nerve pain). Patient denies visual changes. Patient reports self foot exams.    O:  POCT BG: 162  Lab Results  Component Value Date   HGBA1C 9.4 (A) 10/04/2020    There were no vitals filed for this visit.  Lipid Panel     Component Value Date/Time   CHOL 132 10/04/2020 1625   TRIG 84 10/04/2020 1625   HDL 53 10/04/2020 1625   CHOLHDL 2.5 10/04/2020 1625   CHOLHDL 3.8 05/27/2014 1744   VLDL 24 05/27/2014 1744   LDLCALC 63 10/04/2020 1625    Home fasting blood sugars: not checking  2 hour post-meal/random blood sugars: not checking.  Clinical Atherosclerotic Cardiovascular Disease (ASCVD): No  The 10-year ASCVD risk score Denman George DC Jr., et al., 2013) is: 35.6%   Values used to calculate the score:     Age: 70 years     Sex: Female     Is Non-Hispanic African American: Yes     Diabetic: Yes     Tobacco smoker: Yes     Systolic Blood Pressure: 139 mmHg     Is BP treated: No     HDL Cholesterol: 53 mg/dL     Total Cholesterol: 132 mg/dL   A/P: Diabetes longstanding currently uncontrolled with most recent A1c 9.4 on 10/04/20, up from 8.2 in 11/2019. Patient is able to verbalize appropriate hypoglycemia management plan. Medication adherence appears appropriate over the last few weeks. Cannot make medication adjustments or assess BG control without home readings though she does deny hypoglycemia events. She agrees to checking her BG twice daily for the next 2 weeks and return for a follow up visit. She agrees to bringing her glucometer to that visit.   -Continue current medications: Trulicity 1.5 mg once weekly, glipizide 5 mg BIDAC -Check BG twice daily: once in the morning (fasting) and  once about 2 hours after her biggest meal (2h post-prandial) -Bring meter to next visit -Extensively discussed pathophysiology of diabetes, recommended lifestyle interventions, dietary effects on blood sugar control -Counseled on s/sx of and management of hypoglycemia -Next A1C anticipated 12/2020.   ASCVD risk - primary prevention in patient with diabetes. Last LDL is controlled. ASCVD risk score is >20%  - high intensity statin indicated.  -Continued  atorvastatin 40 mg.   Written patient instructions provided.  Total time in face to face counseling 20 minutes.   Follow up Pharmacist Clinic Visit in 2 weeks.     Pervis Hocking, PharmD PGY2 Ambulatory Care Pharmacy Resident 11/15/2020 3:38 PM

## 2020-11-13 NOTE — Progress Notes (Signed)
  Subjective:  Patient ID: Ana Hines, female    DOB: Jan 11, 1951,  MRN: [id number]  70 y.o. female presents with preventative diabetic foot care and painful thick toenails that are difficult to trim. Pain interferes with ambulation. Aggravating factors include wearing enclosed shoe gear. Pain is relieved with periodic professional debridement..    Patient c/o pain in both feet described as numbness and this has been going on for several months.   Patient does not monitor blood glucose on a daily basis. She states she has been assisting other family members with health issues and has not seen about her own needs.   PCP: Claiborne Rigg, NP and last visit was:   Review of Systems: Negative except as noted in the HPI.   Allergies  Allergen Reactions   Sulfa Antibiotics Hives    Has patient had a PCN reaction causing immediate rash, facial/tongue/throat swelling, SOB or lightheadedness with hypotension: Unknown Has patient had a PCN reaction causing severe rash involving mucus membranes or skin necrosis: Unknown Has patient had a PCN reaction that required hospitalization: Unknown Has patient had a PCN reaction occurring within the last 10 years: Unknown If all of the above answers are "NO", then may proceed with Cephalosporin use.    Sulfonamide Derivatives     Objective:  There were no vitals filed for this visit. Constitutional Patient is a pleasant 70 y.o. African American female thin build in AAO x 3.  Vascular Capillary fill time to digits <3 seconds b/l lower extremities. Nonpalpable DP pulse(s) b/l lower extremities. Nonpalpable PT pulse(s) b/l lower extremities. Pedal hair absent. Lower extremity skin temperature gradient warm to cool. No pain with calf compression b/l. No edema noted b/l lower extremities. No cyanosis or clubbing noted.  Neurologic Normal speech. Pt has subjective symptoms of neuropathy. Protective sensation intact 5/5 intact bilaterally with 10g monofilament  b/l. Vibratory sensation intact b/l.  Dermatologic Pedal skin is thin shiny, atrophic b/l lower extremities. Toenails 1-5 b/l elongated, discolored, dystrophic, thickened, crumbly with subungual debris and tenderness to dorsal palpation. No hyperkeratotic nor porokeratotic lesions present on today's visit.  Orthopedic: Normal muscle strength 5/5 to all lower extremity muscle groups bilaterally. No pain crepitus or joint limitation noted with ROM b/l. Hammertoe(s) noted to the R 2nd toe.   Hemoglobin A1C Latest Ref Rng & Units 10/04/2020 11/26/2019  HGBA1C 0.0 - 7.0 % 9.4(A) 8.2(A)  Some recent data might be hidden   Assessment:   1. Pain due to onychomycosis of toenails of both feet   2. Diminished pulses in lower extremity   3. Diabetes mellitus without complication (HCC)   4. PAD (peripheral artery disease) (HCC)    Plan:  Patient was evaluated and treated and all questions answered.  Onychomycosis with pain -Nails palliatively debridement as below. -Educated on self-care  Procedure: Nail Debridement Rationale: Pain Type of Debridement: manual, sharp debridement. Instrumentation: Nail nipper, rotary burr. Number of Nails: 10  -Examined patient. -Discussed findings of diminished pulses. Advised blood sugar control to minimize neuropathy symptoms. Ordered arterial segmental pressures/ABIs due to diabetes, long-term tobacco use and hyperlipidemia. -Continue diabetic foot care principles. -Patient to continue soft, supportive shoe gear daily. -Toenails 1-5 b/l were debrided in length and girth with sterile nail nippers and dremel without iatrogenic bleeding.  -Patient to report any pedal injuries to medical professional immediately. -Patient/POA to call should there be question/concern in the interim.  Return in about 3 months (around 02/13/2021).  Freddie Breech, DPM

## 2020-11-15 ENCOUNTER — Ambulatory Visit: Payer: Medicare HMO | Attending: Nurse Practitioner | Admitting: Pharmacist

## 2020-11-15 ENCOUNTER — Other Ambulatory Visit: Payer: Self-pay

## 2020-11-15 ENCOUNTER — Encounter: Payer: Self-pay | Admitting: Pharmacist

## 2020-11-15 DIAGNOSIS — E1169 Type 2 diabetes mellitus with other specified complication: Secondary | ICD-10-CM

## 2020-11-15 LAB — GLUCOSE, POCT (MANUAL RESULT ENTRY): POC Glucose: 162 mg/dl — AB (ref 70–99)

## 2020-11-17 ENCOUNTER — Other Ambulatory Visit: Payer: Self-pay

## 2020-11-21 ENCOUNTER — Other Ambulatory Visit: Payer: Self-pay

## 2020-11-21 ENCOUNTER — Other Ambulatory Visit: Payer: Self-pay | Admitting: Nurse Practitioner

## 2020-11-21 DIAGNOSIS — E1165 Type 2 diabetes mellitus with hyperglycemia: Secondary | ICD-10-CM

## 2020-11-21 MED ORDER — ATORVASTATIN CALCIUM 40 MG PO TABS
ORAL_TABLET | Freq: Every day | ORAL | 1 refills | Status: DC
  Filled 2020-11-21 – 2020-12-05 (×2): qty 90, 90d supply, fill #0

## 2020-11-21 NOTE — Telephone Encounter (Signed)
Requested Prescriptions  Pending Prescriptions Disp Refills  . atorvastatin (LIPITOR) 40 MG tablet 90 tablet 1    Sig: TAKE 1 TABLET (40 MG TOTAL) BY MOUTH DAILY.     Cardiovascular:  Antilipid - Statins Passed - 11/21/2020  4:47 PM      Passed - Total Cholesterol in normal range and within 360 days    Cholesterol, Total  Date Value Ref Range Status  10/04/2020 132 100 - 199 mg/dL Final         Passed - LDL in normal range and within 360 days    LDL Chol Calc (NIH)  Date Value Ref Range Status  10/04/2020 63 0 - 99 mg/dL Final         Passed - HDL in normal range and within 360 days    HDL  Date Value Ref Range Status  10/04/2020 53 >39 mg/dL Final         Passed - Triglycerides in normal range and within 360 days    Triglycerides  Date Value Ref Range Status  10/04/2020 84 0 - 149 mg/dL Final         Passed - Patient is not pregnant      Passed - Valid encounter within last 12 months    Recent Outpatient Visits          6 days ago Type 2 diabetes mellitus with other specified complication, unspecified whether long term insulin use (HCC)   [place] [facility] [place], Stephen L, RPH-CPP   1 month ago Type 2 diabetes mellitus with other specified complication, unspecified whether long term insulin use (HCC)   [place] [facility] [place], [place] W, NP   12 months ago Type 2 diabetes mellitus with hyperglycemia, without long-term current use of insulin [facility])   [place] [facility] [place] [facility] [place], [place] W, NP   1 year ago Type 2 diabetes mellitus with hyperglycemia, without long-term current use of insulin [facility])   [place] [facility] [place], Shea Stakes, NP   1 year ago Left wrist pain   [place] [facility] [place], Marzella Schlein, [place]      Future Appointments            In 1 week Lois Huxley, Cornelius Moras, RPH-CPP [facility]   In 1 month Claiborne Rigg, NP [facility] [employer] [facility]

## 2020-11-22 ENCOUNTER — Other Ambulatory Visit: Payer: Self-pay | Admitting: Nurse Practitioner

## 2020-11-22 ENCOUNTER — Other Ambulatory Visit: Payer: Self-pay

## 2020-11-22 DIAGNOSIS — M810 Age-related osteoporosis without current pathological fracture: Secondary | ICD-10-CM

## 2020-11-22 MED ORDER — ALENDRONATE SODIUM 70 MG PO TABS
ORAL_TABLET | ORAL | 2 refills | Status: DC
  Filled 2020-11-22: qty 4, 28d supply, fill #0

## 2020-11-22 NOTE — Telephone Encounter (Signed)
Requested Prescriptions  Pending Prescriptions Disp Refills  . alendronate (FOSAMAX) 70 MG tablet 4 tablet 2    Sig: Take with a full glass of water on an empty stomach.     Endocrinology:  Bisphosphonates Failed - 11/22/2020 12:26 PM      Failed - Vitamin D in normal range and within 360 days    Vit D, 25-Hydroxy  Date Value Ref Range Status  05/12/2017 33.3 30.0 - 100.0 ng/mL Final    Comment:    Vitamin D deficiency has been defined by the Institute of Medicine and an Endocrine Society practice guideline as a level of serum 25-OH vitamin D less than 20 ng/mL (1,2). The Endocrine Society went on to further define vitamin D insufficiency as a level between 21 and 29 ng/mL (2). 1. IOM (Institute of Medicine). 2010. Dietary reference    intakes for calcium and D. [place]: The    [employer]. 2. Holick MF, Binkley [place], Bischoff-Ferrari HA, et al.    Evaluation, treatment, and prevention of vitamin D    deficiency: an Endocrine Society clinical practice    guideline. JCEM. 2011 Jul; 96(7):1911-30.          Passed - Ca in normal range and within 360 days    Calcium  Date Value Ref Range Status  10/04/2020 10.2 8.7 - 10.3 mg/dL Final   Calcium, Ion  Date Value Ref Range Status  05/17/2014 1.12 (L) 1.13 - 1.30 mmol/L Final         Passed - Valid encounter within last 12 months    Recent Outpatient Visits          1 week ago Type 2 diabetes mellitus with other specified complication, unspecified whether long term insulin use (HCC)   [place] [facility] [place], Jeannett Senior L, RPH-CPP   1 month ago Type 2 diabetes mellitus with other specified complication, unspecified whether long term insulin use [facility])   [place] [facility] [place], [place] W, NP   12 months ago Type 2 diabetes mellitus with hyperglycemia, without long-term current use of insulin [facility])   [place] [facility] [place], [place] W, NP   1  year ago Type 2 diabetes mellitus with hyperglycemia, without long-term current use of insulin [facility])   [place] [facility] [place], Shea Stakes, NP   1 year ago Left wrist pain   [place] [facility] [place], Marzella Schlein, [place]      Future Appointments            In 1 week Lois Huxley, Cornelius Moras, RPH-CPP [place] [facility]   In 1 month Claiborne Rigg, NP [facility] [facility]

## 2020-11-23 ENCOUNTER — Other Ambulatory Visit: Payer: Self-pay

## 2020-11-27 NOTE — Progress Notes (Signed)
S:     PCP: Bertram Denver, NP  Patient presents for diabetes evaluation, education, and management. Patient was referred and last seen by Primary Care Provider on 10/04/20. At that visit, POCT BG was 227 and A1c 9.4 (up from 8.2 in August 2021). She refused to start insulin. PCP was hesitant to increase dose of Trulicity d/t BMI 13.94 so resumed glipizide. At last CPP visit on 11/15/20, patient reported compliance with medications but not checking BG at home so unable to make medication changes. She agreed to checking her BG twice daily for the next 2 weeks and bring glucometer to follow up visit.   Today, patient arrives in good spirits. Denies dizziness, lightheadedness, shakiness, sweating. She has checked her BG 1-2 times daily since last visit as agreed upon and brings glucometer and log of BG readings. Only 1 low reading of 61, states she felt tired and ate something and felt better. Reports her BG was high this morning because she drank a lot of beer and ate cake last night for her girlfriend's birthday celebration. Ate a biscuit this morning and a full sub from [employer] for lunch. No changes in diet/exercise since last visit but knows she needs to work on her diet.   Family/Social History:  -Fhx: nonpertinent -Tobacco: smokes daily  Insurance coverage/medication affordability: Medicare + Medicaid  Medication adherence reported: no missed doses in last 3 weeks.   Current diabetes medications include: Trulicity 1.5 mg weekly, glipizide 5 mg BIDAC Current hypertension medications include: lisinopril 2.5 mg daily Current hyperlipidemia medications include: atorvastatin 40 mg daily  Patient denies hypoglycemic events.  Patient reported dietary habits: Eats ~2 meals/day. Usually only 1 big meal and 1 small meal.  At breakfast around 10:45am: bacon, fried green tomatoes, fried egg, toast with butter, coffee; 10:45am Lunch: salad, egg and yogurt Dinner: fried chicken, fish, stewed  beef  Patient-reported exercise habits: walks around the block 3 times every morning, tries to walk briskly   Patient denies nocturia (nighttime urination).  Patient denies neuropathy (nerve pain). Patient denies visual changes. Patient reports self foot exams.    O:  POCT BG: 252 (last ate ~1 hour ago full sub from [employer], biscuit this morning)  Lab Results  Component Value Date   HGBA1C 9.4 (A) 10/04/2020   There were no vitals filed for this visit.  Lipid Panel     Component Value Date/Time   CHOL 132 10/04/2020 1625   TRIG 84 10/04/2020 1625   HDL 53 10/04/2020 1625   CHOLHDL 2.5 10/04/2020 1625   CHOLHDL 3.8 05/27/2014 1744   VLDL 24 05/27/2014 1744   LDLCALC 63 10/04/2020 1625   Started checking twice daily, once fasting in the morning and once 2h post-prandial Home fasting blood sugars: 152, 61, 99, 150, 154, 115, 112, 158, 88, 146, 125, 152, 126, 118, 192, 178, 94, 118,  2-3 hour post-meal/random blood sugars: 190, 281, 181, 207, 183, 187, 224, 268, 224  Clinical Atherosclerotic Cardiovascular Disease (ASCVD): No  The 10-year ASCVD risk score Denman George DC Jr., et al., 2013) is: 35.6%   Values used to calculate the score:     Age: 70 years     Sex: Female     Is Non-Hispanic African American: Yes     Diabetic: Yes     Tobacco smoker: Yes     Systolic Blood Pressure: 139 mmHg     Is BP treated: No     HDL Cholesterol: 53 mg/dL  Total Cholesterol: 132 mg/dL   A/P: Diabetes longstanding currently uncontrolled with most recent A1c 9.4 on 10/04/20, up from 8.2 in 11/2019. Patient is able to verbalize appropriate hypoglycemia management plan. Medication adherence appears appropriate over the last few weeks. Some home fasting BG are at goal, however most post-prandial sugars are elevated. Will add SGLT2 inhibitor today to help with lower BG and provide additional CV and kidney benefit. Could consider increasing evening glipizide in the future if evening post-prandial  BG remain elevated. Holding off at this time as SGLT2 does not have risk of hypoglycemia as glipizide does.  -Start Jardiance 10 mg daily. Counseled patient on sick day rules and possible adverse effects.  -Continue current medications: Trulicity 1.5 mg once weekly, glipizide 5 mg BIDAC  -Continue to check BG twice daily: once in the morning (fasting) and once about 2 hours after her biggest meal (2h post-prandial) and bring meter to next visit -Extensively discussed pathophysiology of diabetes, recommended lifestyle interventions, dietary effects on blood sugar control -Counseled on s/sx of and management of hypoglycemia -Next A1C anticipated 12/2020.   ASCVD risk - primary prevention in patient with diabetes. Last LDL is controlled. ASCVD risk score is >20%  - high intensity statin indicated.  -Continued atorvastatin 40 mg.   Written patient instructions provided. Total time in face to face counseling 30 minutes.   Follow up Pharmacist Clinic Visit in 3 weeks to check tolerability to Jardiance, BG, and BMET.     Pervis Hocking, PharmD PGY2 Ambulatory Care Pharmacy Resident 11/29/2020 3:33 PM

## 2020-11-29 ENCOUNTER — Other Ambulatory Visit: Payer: Self-pay

## 2020-11-29 ENCOUNTER — Encounter: Payer: Self-pay | Admitting: Pharmacist

## 2020-11-29 ENCOUNTER — Ambulatory Visit: Payer: Medicare HMO | Attending: Nurse Practitioner | Admitting: Pharmacist

## 2020-11-29 DIAGNOSIS — E1169 Type 2 diabetes mellitus with other specified complication: Secondary | ICD-10-CM | POA: Diagnosis not present

## 2020-11-29 LAB — GLUCOSE, POCT (MANUAL RESULT ENTRY): POC Glucose: 252 mg/dl — AB (ref 70–99)

## 2020-11-29 MED ORDER — EMPAGLIFLOZIN 10 MG PO TABS
10.0000 mg | ORAL_TABLET | Freq: Every day | ORAL | 2 refills | Status: DC
  Filled 2020-11-29: qty 30, 30d supply, fill #0

## 2020-11-30 ENCOUNTER — Other Ambulatory Visit: Payer: Self-pay

## 2020-12-01 ENCOUNTER — Other Ambulatory Visit: Payer: Self-pay

## 2020-12-01 ENCOUNTER — Other Ambulatory Visit: Payer: Self-pay | Admitting: Pharmacist

## 2020-12-01 ENCOUNTER — Other Ambulatory Visit: Payer: Self-pay | Admitting: Nurse Practitioner

## 2020-12-01 MED ORDER — ACCU-CHEK GUIDE W/DEVICE KIT
PACK | 0 refills | Status: AC
  Filled 2020-12-01: qty 1, 1d supply, fill #0

## 2020-12-01 MED ORDER — ACCU-CHEK SOFTCLIX LANCETS MISC
3 refills | Status: AC
  Filled 2020-12-01: qty 100, 50d supply, fill #0

## 2020-12-01 MED ORDER — ACCU-CHEK GUIDE VI STRP
ORAL_STRIP | 2 refills | Status: DC
  Filled 2020-12-01: qty 100, 50d supply, fill #0

## 2020-12-01 MED ORDER — ONETOUCH VERIO VI STRP
ORAL_STRIP | Freq: Two times a day (BID) | 12 refills | Status: DC
  Filled 2020-12-01: qty 100, 50d supply, fill #0

## 2020-12-01 NOTE — Telephone Encounter (Signed)
Requested medication (s) are due for refill today: expired Rx  Requested medication (s) are on the active medication list: yes  Last refill:  03/08/20 #100 12 refills  Future visit scheduled: yes in 1 month  Notes to clinic:  expired Rx [id number] . Do you want to renew Rx?     Requested Prescriptions  Pending Prescriptions Disp Refills   glucose blood (ONETOUCH VERIO) test strip 100 strip 12    Sig: USE AS DIRECTED TWICE DAILY     Endocrinology: Diabetes - Testing Supplies Passed - 12/01/2020 12:27 PM      Passed - Valid encounter within last 12 months    Recent Outpatient Visits           2 days ago Type 2 diabetes mellitus with other specified complication, unspecified whether long term insulin use (HCC)   [place] [facility] [place], Jeannett Senior L, RPH-CPP   2 weeks ago Type 2 diabetes mellitus with other specified complication, unspecified whether long term insulin use (HCC)   [place] [facility] [place], Jeannett Senior L, RPH-CPP   1 month ago Type 2 diabetes mellitus with other specified complication, unspecified whether long term insulin use [facility])   [place] [facility] [place], [place] W, NP   1 year ago Type 2 diabetes mellitus with hyperglycemia, without long-term current use of insulin [facility])   [place] [facility] [place], [place] W, NP   1 year ago Type 2 diabetes mellitus with hyperglycemia, without long-term current use of insulin [facility])   [place] [facility] Claiborne Rigg, NP       Future Appointments             In 2 weeks Lois Huxley, Cornelius Moras, RPH-CPP [place] [facility]   In 1 month Claiborne Rigg, NP [facility] [employer] [facility]

## 2020-12-06 ENCOUNTER — Other Ambulatory Visit: Payer: Self-pay

## 2020-12-21 ENCOUNTER — Ambulatory Visit: Payer: Medicare HMO | Admitting: Pharmacist

## 2020-12-26 ENCOUNTER — Other Ambulatory Visit: Payer: Self-pay | Admitting: Nurse Practitioner

## 2020-12-26 ENCOUNTER — Other Ambulatory Visit: Payer: Self-pay

## 2020-12-26 DIAGNOSIS — M810 Age-related osteoporosis without current pathological fracture: Secondary | ICD-10-CM

## 2020-12-26 NOTE — Telephone Encounter (Signed)
Pt called back to report that she will be completely out of her current supply this Thursday morning. Please advise

## 2020-12-26 NOTE — Telephone Encounter (Signed)
[facility] and would [facility] the [facility] glucose blood (ACCU-CHEK GUIDE) test strip , empagliflozin (JARDIANCE) 10 MG TABS tablet. alendronate (FOSAMAX) 70 MG tablet, Dulaglutide (TRULICITY) 1.5 MG/0.5ML SOPN.    [facility] [address], [place], [place] [address] [phone]

## 2020-12-27 MED ORDER — ALENDRONATE SODIUM 70 MG PO TABS: ORAL_TABLET | ORAL | 2 refills | Status: DC

## 2020-12-27 MED ORDER — TRULICITY 1.5 MG/0.5ML ~~LOC~~ SOAJ
SUBCUTANEOUS | 2 refills | Status: DC
Start: 2020-12-27 — End: 2021-01-10

## 2020-12-27 MED ORDER — EMPAGLIFLOZIN 10 MG PO TABS
10.0000 mg | ORAL_TABLET | Freq: Every day | ORAL | 2 refills | Status: DC
Start: 2020-12-27 — End: 2021-01-10

## 2020-12-27 NOTE — Telephone Encounter (Signed)
Requested Prescriptions  Pending Prescriptions Disp Refills  . Dulaglutide (TRULICITY) 1.5 LM/7.8ML SOPN 2 mL 2    Sig: INJECT 1.5MG  SUBCUTANEOUSLY ONCE A WEEK . APPOINTMENT REQUIRED FOR FUTURE REFILLS     Endocrinology:  Diabetes - GLP-1 Receptor Agonists Failed - 12/26/2020 10:24 PM      Failed - HBA1C is between 0 and 7.9 and within 180 days    HbA1c, POC (controlled diabetic range)  Date Value Ref Range Status  10/04/2020 9.4 (A) 0.0 - 7.0 % Final         Passed - Valid encounter within last 6 months    Recent Outpatient Visits          4 weeks ago Type 2 diabetes mellitus with other specified complication, unspecified whether long term insulin use ([place])   [place], Annie Main L, RPH-CPP   1 month ago Type 2 diabetes mellitus with other specified complication, unspecified whether long term insulin use ([place])   [place], Annie Main L, RPH-CPP   2 months ago Type 2 diabetes mellitus with other specified complication, unspecified whether long term insulin use [facility])   [place], [place] W, NP   1 year ago Type 2 diabetes mellitus with hyperglycemia, without long-term current use of insulin [facility])   [place], [place] W, NP   1 year ago Type 2 diabetes mellitus with hyperglycemia, without long-term current use of insulin [facility])   [place], Vernia Buff, NP      Future Appointments            In 1 week [name], NP [place]   In 1 week Daisy Blossom, Jarome Matin, [place]           . empagliflozin (JARDIANCE) 10 MG TABS tablet 30 tablet 2    Sig: Take 1 tablet (10 mg total) by mouth daily before breakfast.     Endocrinology:  Diabetes - SGLT2 Inhibitors Failed - 12/26/2020 10:24 PM      Failed - HBA1C is between 0  and 7.9 and within 180 days    HbA1c, POC (controlled diabetic range)  Date Value Ref Range Status  10/04/2020 9.4 (A) 0.0 - 7.0 % Final         Failed - AA eGFR in normal range and within 360 days    GFR calc Af Amer  Date Value Ref Range Status  11/26/2019 100 >59 mL/min/1.73 Final    Comment:    **[facility] currently reports eGFR in compliance with the current**   recommendations of the [employer]. [facility] will   update reporting as new guidelines are published from the NKF-ASN   Task force.    GFR calc non Af Amer  Date Value Ref Range Status  11/26/2019 86 >59 mL/min/1.73 Final   eGFR  Date Value Ref Range Status  10/04/2020 94 >59 mL/min/1.73 Final         Passed - Cr in normal range and within 360 days    Creatinine, Ser  Date Value Ref Range Status  10/04/2020 0.69 0.57 - 1.00 mg/dL Final   Creatinine, Urine  Date Value Ref Range Status  06/24/2016 113 20 - 320 mg/dL Final         Passed - LDL in normal range and  within 360 days    LDL Chol Calc (NIH)  Date Value Ref Range Status  10/04/2020 63 0 - 99 mg/dL Final         Passed - Valid encounter within last 6 months    Recent Outpatient Visits          4 weeks ago Type 2 diabetes mellitus with other specified complication, unspecified whether long term insulin use ([place])   [place], Annie Main L, RPH-CPP   1 month ago Type 2 diabetes mellitus with other specified complication, unspecified whether long term insulin use ([place])   [place], Annie Main L, RPH-CPP   2 months ago Type 2 diabetes mellitus with other specified complication, unspecified whether long term insulin use [facility])   [place], [place] W, NP   1 year ago Type 2 diabetes mellitus with hyperglycemia, without long-term current use of insulin [facility])   [place], [place] W, NP    1 year ago Type 2 diabetes mellitus with hyperglycemia, without long-term current use of insulin [facility])   [place], Vernia Buff, NP      Future Appointments            In 1 week [name], NP [place]   In 1 week Daisy Blossom, Jarome Matin, [place]           . alendronate (FOSAMAX) 70 MG tablet 4 tablet 2    Sig: Take with a full glass of water on an empty stomach once a week     Endocrinology:  Bisphosphonates Failed - 12/26/2020 10:24 PM      Failed - Vitamin D in normal range and within 360 days    Vit D, 25-Hydroxy  Date Value Ref Range Status  05/12/2017 33.3 30.0 - 100.0 ng/mL Final    Comment:    Vitamin D deficiency has been defined by the [place] practice guideline as a level of serum 25-OH vitamin D less than 20 ng/mL (1,2). The Endocrine Society went on to further define vitamin D insufficiency as a level between 21 and 29 ng/mL (2). 1. IOM (Institute of Medicine). 2010. Dietary reference    intakes for calcium and D. [place]: The    [employer]. 2. Holick MF, [place], Bischoff-Ferrari HA, et al.    Evaluation, treatment, and prevention of vitamin D    deficiency: an Endocrine Society clinical practice    guideline. JCEM. 2011 Jul; 96(7):1911-30.          Passed - Ca in normal range and within 360 days    Calcium  Date Value Ref Range Status  10/04/2020 10.2 8.7 - 10.3 mg/dL Final   Calcium, Ion  Date Value Ref Range Status  05/17/2014 1.12 (L) 1.13 - 1.30 mmol/L Final         Passed - Valid encounter within last 12 months    Recent Outpatient Visits          4 weeks ago Type 2 diabetes mellitus with other specified complication, unspecified whether long term insulin use [facility])   [place], Annie Main L, RPH-CPP   1 month ago Type 2 diabetes  mellitus with other specified complication, unspecified whether long term insulin use (  [facility])   [place], Jarome Matin, RPH-CPP   2 months ago Type 2 diabetes mellitus with other specified complication, unspecified whether long term insulin use [facility])   [place], [place] W, NP   1 year ago Type 2 diabetes mellitus with hyperglycemia, without long-term current use of insulin [facility])   [place], [place] W, NP   1 year ago Type 2 diabetes mellitus with hyperglycemia, without long-term current use of insulin [facility])   [place], [place] W, NP      Future Appointments            In 1 week [name], NP [place]   In 1 week Daisy Blossom, Jarome Matin, [place]

## 2020-12-29 ENCOUNTER — Telehealth: Payer: Self-pay | Admitting: Nurse Practitioner

## 2020-12-29 ENCOUNTER — Other Ambulatory Visit: Payer: Self-pay

## 2020-12-29 NOTE — Telephone Encounter (Signed)
Pt is asking to speak w/ Provider for assistance regarding medications prescribed due to high expense (if there are other options.   Dulaglutide (TRULICITY) 1.5 MG/0.5ML SOPN [[id number]]   empagliflozin (JARDIANCE) 10 MG TABS tablet [[id number]]   Pt has not taken these meds for 3 days. Please advise and thank you

## 2020-12-29 NOTE — Telephone Encounter (Signed)
Can we find cheaper medications for this patient.

## 2020-12-29 NOTE — Telephone Encounter (Signed)
Patient has Medicare so she may be eligible for patient assistance. I will route this to [facility] to be sure, but patient should be instructed to come by our pharmacy so she can fill out some forms for patient assistance.

## 2021-01-01 ENCOUNTER — Telehealth: Payer: Self-pay | Admitting: Nurse Practitioner

## 2021-01-01 NOTE — Telephone Encounter (Signed)
Pt is out of town and needs new rx accu chek testing strips. Pt checks her BS 3 times a day. [facility] [address] [place] phone number [phone]

## 2021-01-02 MED ORDER — ACCU-CHEK GUIDE VI STRP
ORAL_STRIP | 2 refills | Status: DC
Start: 2021-01-02 — End: 2021-01-10

## 2021-01-02 NOTE — Telephone Encounter (Signed)
Rx sent 

## 2021-01-02 NOTE — Telephone Encounter (Signed)
Patient would be eligible for patient assistance.  Left patient a voicemail and requested a call back.

## 2021-01-02 NOTE — Telephone Encounter (Signed)
Called pt made aware of RX sent to pharmacy 

## 2021-01-05 ENCOUNTER — Encounter: Payer: Medicare HMO | Admitting: Nurse Practitioner

## 2021-01-08 ENCOUNTER — Ambulatory Visit: Payer: Medicare HMO | Admitting: Pharmacist

## 2021-01-09 NOTE — Progress Notes (Signed)
S:    PCP: Bertram [place], NP  Patient presents for diabetes evaluation, education, and management. Patient was referred and last seen by Primary Care Provider on 10/04/20. At that visit, POCT BG was 227 and A1c 9.4 (up from 8.2 in August 2021). She refused to start insulin. PCP was hesitant to increase dose of Trulicity d/t BMI 13.94 so resumed glipizide. Seen by CPP on 7/27 and 8/10 at which time she had started checking BG. Started [place] but patient reported high cost of Jardiance and Trulicity. Plan was for patient to come in to fill out patient assistance paperwork.  Today, patient arrives in good spirits. Denies dizziness, lightheadedness, shakiness, sweating. She has checked her BG 1-3 times daily since last visit and brings glucometer with log of BG readings. Not having any more lows over the last few weeks unlike at previous visit. She has been out of glipizide for the last 2 weeks. She also ran out of Jardiance about 1 week ago. Had issues filling these when she was in [place] for the last month but was able to get Trulicity and is still taking that. Also ran out of test strips.    Family/Social History:  -Fhx: nonpertinent -Tobacco: smokes daily  Insurance coverage/medication affordability: Medicare + Medicaid  Medication adherence reported: no missed doses in last 3 weeks.  Current diabetes medications include: Trulicity 1.5 mg weekly, glipizide 5 mg BIDAC (not taking x 2 weeks), Jardiance 10 mg daily (not taking x 1 week) Current hypertension medications include: lisinopril 2.5 mg daily Current hyperlipidemia medications include: atorvastatin 40 mg daily  Patient denies hypoglycemic events.  Patient reported dietary habits: Eats ~2 meals/day. Usually only 1 big meal and 1 small meal.  At breakfast around 10:45am: bacon, fried green tomatoes, fried egg, toast with butter, coffee; 10:45am Lunch: salad, egg and yogurt Dinner: fried chicken, fish, stewed  beef  Patient-reported exercise habits: walks around the block 3 times every morning, tries to walk briskly   Patient denies nocturia (nighttime urination).  Patient denies neuropathy (nerve pain). Patient denies visual changes. Patient reports self foot exams.    O:  POCT BG: 211 (last ate ~3 hours ago, had a cup of coffee and a banana nut muffin)  Lab Results  Component Value Date   HGBA1C 9.4 (A) 10/04/2020   There were no vitals filed for this visit.  Lipid Panel     Component Value Date/Time   CHOL 132 10/04/2020 1625   TRIG 84 10/04/2020 1625   HDL 53 10/04/2020 1625   CHOLHDL 2.5 10/04/2020 1625   CHOLHDL 3.8 05/27/2014 1744   VLDL 24 05/27/2014 1744   LDLCALC 63 10/04/2020 1625   Home fasting blood sugars: 220, 111, 109, 101, 183, 96, 125, 100, 95, 91, 99 2-3 hour post-meal/random blood sugars: 183, 222, 114, 247, 200, 244, 162  Clinical Atherosclerotic Cardiovascular Disease (ASCVD): No  The 10-year ASCVD risk score (Arnett DK, et al., 2019) is: 35.6%   Values used to calculate the score:     Age: 33 years     Sex: Female     Is Non-Hispanic African American: Yes     Diabetic: Yes     Tobacco smoker: Yes     Systolic Blood Pressure: 139 mmHg     Is BP treated: No     HDL Cholesterol: 53 mg/dL     Total Cholesterol: 132 mg/dL   A/P: Diabetes longstanding currently uncontrolled with most recent A1c 9.4 on 10/04/20, up from 8.2  in 11/2019. Patient is able to verbalize appropriate hypoglycemia management plan. Wonder if issues with cost of medications was because she was filling these in [place]. Have resent in the prescriptions she needs to our pharmacy and told her that if costs are still high, to ask for patient assistance paperwork for Trulicity and Jardiance to fill out and she confirms understanding. She will let us know if she has issues filling these prescriptions. BG have improved since addition of Jardiance and in spite of patient being out of glipizide for  the last couple of weeks. In fact, she has had no lows since she ran out of glipizide, but is still have some BG above goal. Because of this, will decrease glipizide to 5 mg once daily and switch to the extended release formulation. Did not check a BMET today since she has been out of Jardiance for the last week but recommend checking at next visit if she has been adherent.  -Decrease glipizide to 5 mg once daily and switch to XL formulation -Continue Jardiance 10 mg daily and Trulicity 1.5 mg weekly -Continue to check BG twice daily: once in the morning (fasting) and once about 2 hours after her biggest meal (2h post-prandial) and bring meter to next visit -Extensively discussed pathophysiology of diabetes, recommended lifestyle interventions, dietary effects on blood sugar control -Counseled on s/sx of and management of hypoglycemia -Next A1C anticipated at next PCP visit next month.   ASCVD risk - primary prevention in patient with diabetes. Last LDL is controlled. ASCVD risk score is >20%  - high intensity statin indicated.  -Continued atorvastatin 40 mg.   Total time in face to face counseling 30 minutes. Follow up PCP visit next month: due for A1c and BMET at that time.    Pervis Hocking, PharmD PGY2 Ambulatory Care Pharmacy Resident 01/09/2021 8:00 PM

## 2021-01-10 ENCOUNTER — Ambulatory Visit: Payer: Medicare HMO | Attending: Nurse Practitioner | Admitting: Pharmacist

## 2021-01-10 ENCOUNTER — Other Ambulatory Visit: Payer: Self-pay

## 2021-01-10 DIAGNOSIS — M810 Age-related osteoporosis without current pathological fracture: Secondary | ICD-10-CM | POA: Diagnosis not present

## 2021-01-10 DIAGNOSIS — E1165 Type 2 diabetes mellitus with hyperglycemia: Secondary | ICD-10-CM

## 2021-01-10 DIAGNOSIS — E1169 Type 2 diabetes mellitus with other specified complication: Secondary | ICD-10-CM

## 2021-01-10 LAB — GLUCOSE, POCT (MANUAL RESULT ENTRY): POC Glucose: 211 mg/dl — AB (ref 70–99)

## 2021-01-10 MED ORDER — GLIPIZIDE ER 5 MG PO TB24
5.0000 mg | ORAL_TABLET | Freq: Every day | ORAL | 2 refills | Status: DC
  Filled 2021-01-10: qty 30, 30d supply, fill #0

## 2021-01-10 MED ORDER — LISINOPRIL 2.5 MG PO TABS
ORAL_TABLET | Freq: Every day | ORAL | 0 refills | Status: DC
  Filled 2021-01-10: qty 90, 90d supply, fill #0

## 2021-01-10 MED ORDER — TRULICITY 1.5 MG/0.5ML ~~LOC~~ SOAJ
SUBCUTANEOUS | 2 refills | Status: DC
Start: 2021-01-10 — End: 2022-01-21
  Filled 2021-01-10 – 2021-06-15 (×3): qty 2, 28d supply, fill #0
  Filled 2021-06-15: qty 2, 28d supply, fill #1

## 2021-01-10 MED ORDER — EMPAGLIFLOZIN 10 MG PO TABS
10.0000 mg | ORAL_TABLET | Freq: Every day | ORAL | 2 refills | Status: DC
Start: 2021-01-10 — End: 2021-04-30
  Filled 2021-01-10: qty 30, 30d supply, fill #0
  Filled 2021-02-12: qty 30, 30d supply, fill #1
  Filled 2021-03-19: qty 30, 30d supply, fill #2

## 2021-01-10 MED ORDER — ATORVASTATIN CALCIUM 40 MG PO TABS
ORAL_TABLET | Freq: Every day | ORAL | 1 refills | Status: AC
  Filled 2021-01-10: qty 90, fill #0
  Filled 2021-03-07 – 2021-06-25 (×2): qty 90, 90d supply, fill #0
  Filled 2021-06-25: qty 90, 90d supply, fill #1

## 2021-01-10 MED ORDER — ACCU-CHEK GUIDE VI STRP
ORAL_STRIP | 2 refills | Status: AC
  Filled 2021-01-10 – 2021-01-15 (×4): qty 100, 50d supply, fill #0
  Filled 2021-04-03: qty 100, 50d supply, fill #1
  Filled 2021-07-04: qty 100, 50d supply, fill #0

## 2021-01-10 MED ORDER — ALENDRONATE SODIUM 70 MG PO TABS
ORAL_TABLET | ORAL | 2 refills | Status: DC
  Filled 2021-01-10: qty 4, 28d supply, fill #0
  Filled 2021-02-20: qty 4, 28d supply, fill #1

## 2021-01-11 ENCOUNTER — Ambulatory Visit: Payer: Self-pay | Admitting: *Deleted

## 2021-01-11 ENCOUNTER — Other Ambulatory Visit: Payer: Self-pay

## 2021-01-11 NOTE — Telephone Encounter (Signed)
See encounter

## 2021-01-11 NOTE — Telephone Encounter (Signed)
Reason for Disposition  . [1] Caller has NON-URGENT medicine question about med that PCP prescribed AND [2] triager unable to answer question    Protocols used: Medication Question Call-A-AH

## 2021-01-11 NOTE — Telephone Encounter (Signed)
Patient returned call: Patient states she has never taken Lisinopril- she have never been diagnosed with high BP or been informed she needs it. It first shows up in the chart as order only 04/23/20- but no note accompanying it. Sent for provider review.

## 2021-01-11 NOTE — Telephone Encounter (Signed)
Answer Assessment - Initial Assessment Questions 1. NAME of MEDICATION: "What medicine are you calling about?"     lisinopril 2. QUESTION: "What is your question?" (e.g., double dose of medicine, side effect)     Why was it prescribed. 3. PRESCRIBING HCP: "Who prescribed it?" Reason: if prescribed by specialist, call should be referred to that group.     Pharmacist 4. SYMPTOMS: "Do you have any symptoms?"     na 5. SEVERITY: If symptoms are present, ask "Are they mild, moderate or severe?"     na 6. PREGNANCY:  "Is there any chance that you are pregnant?" "When was your last menstrual period?"     na  Protocols used: Medication Question Call-A-AH

## 2021-01-11 NOTE — Telephone Encounter (Signed)
Called and informed Pt that the Lisinopril was used for. Pt stated that no one had let her know, but that she would continue to take if it helps with renal protection.

## 2021-01-15 ENCOUNTER — Other Ambulatory Visit: Payer: Self-pay

## 2021-01-30 ENCOUNTER — Other Ambulatory Visit: Payer: Medicare HMO

## 2021-01-30 ENCOUNTER — Other Ambulatory Visit: Payer: Self-pay | Admitting: Nurse Practitioner

## 2021-01-30 ENCOUNTER — Ambulatory Visit: Payer: Self-pay

## 2021-01-30 DIAGNOSIS — M810 Age-related osteoporosis without current pathological fracture: Secondary | ICD-10-CM

## 2021-01-30 NOTE — Telephone Encounter (Signed)
Pt. States she woke up this morning with swelling at her left jaw. Size "of a golf ball." No pain or difficulty swallowing. States she started lisinopril last week. Would like "someone to take a look at this." Will forward triage as requested per [place] in the practice. Please advise pt.   Answer Assessment - Initial Assessment Questions 1. ONSET: "When did the swelling start?" (e.g., minutes, hours, days)     Today 2. LOCATION: "What part of the face is swollen?"     Left jaw 3. SEVERITY: "How swollen is it?"     Golf ball 4. ITCHING: "Is there any itching?" If Yes, ask: "How much?"   (Scale 1-10; mild, moderate or severe)     No 5. PAIN: "Is the swelling painful to touch?" If Yes, ask: "How painful is it?"   (Scale 1-10; mild, moderate or severe)   - NONE (0): no pain   - MILD (1-3): doesn't interfere with normal activities    - MODERATE (4-7): interferes with normal activities or awakens from sleep    - SEVERE (8-10): excruciating pain, unable to do any normal activities      No 6. FEVER: "Do you have a fever?" If Yes, ask: "What is it, how was it measured, and when did it start?"      No 7. CAUSE: "What do you think is causing the face swelling?"     Unsure 8. RECURRENT SYMPTOM: "Have you had face swelling before?" If Yes, ask: "When was the last time?" "What happened that time?"     No 9. OTHER SYMPTOMS: "Do you have any other symptoms?" (e.g., toothache, leg swelling)     No 10. PREGNANCY: "Is there any chance you are pregnant?" "When was your last menstrual period?"       No  Protocols used: Face Swelling-A-AH

## 2021-01-30 NOTE — Telephone Encounter (Signed)
She can stop lisinopril although she has been on this medication since January. Needs to  be seen in urgent care or emergency room if having any trouble breathing or swallowing.

## 2021-01-31 ENCOUNTER — Telehealth: Payer: Self-pay

## 2021-02-01 NOTE — Telephone Encounter (Signed)
2.5 is not for blood pressure it is only for kidney protection. She can continue all other medications

## 2021-02-01 NOTE — Telephone Encounter (Signed)
Phone call see notes

## 2021-02-01 NOTE — Telephone Encounter (Signed)
Patient has d/c the medication. Does she need a f/u to start new medication? Was this for kidney protection or BP control?  Pls advise if appt needed.

## 2021-02-02 NOTE — Telephone Encounter (Signed)
Talk to pt and informed of the information per PCP, and also informed of appt next week.

## 2021-02-07 ENCOUNTER — Encounter: Payer: Self-pay | Admitting: Nurse Practitioner

## 2021-02-07 ENCOUNTER — Other Ambulatory Visit: Payer: Self-pay

## 2021-02-07 ENCOUNTER — Ambulatory Visit: Payer: Medicare HMO | Attending: Nurse Practitioner | Admitting: Nurse Practitioner

## 2021-02-07 VITALS — BP 136/84 | HR 94 | Ht 69.0 in | Wt 93.0 lb

## 2021-02-07 DIAGNOSIS — I10 Essential (primary) hypertension: Secondary | ICD-10-CM

## 2021-02-07 DIAGNOSIS — E1169 Type 2 diabetes mellitus with other specified complication: Secondary | ICD-10-CM | POA: Diagnosis not present

## 2021-02-07 DIAGNOSIS — Z0000 Encounter for general adult medical examination without abnormal findings: Secondary | ICD-10-CM

## 2021-02-07 DIAGNOSIS — F419 Anxiety disorder, unspecified: Secondary | ICD-10-CM | POA: Diagnosis not present

## 2021-02-07 DIAGNOSIS — Z23 Encounter for immunization: Secondary | ICD-10-CM | POA: Diagnosis not present

## 2021-02-07 DIAGNOSIS — F32A Depression, unspecified: Secondary | ICD-10-CM

## 2021-02-07 DIAGNOSIS — Z1231 Encounter for screening mammogram for malignant neoplasm of breast: Secondary | ICD-10-CM

## 2021-02-07 LAB — POCT GLYCOSYLATED HEMOGLOBIN (HGB A1C): Hemoglobin A1C: 6.8 % — AB (ref 4.0–5.6)

## 2021-02-07 LAB — GLUCOSE, POCT (MANUAL RESULT ENTRY): POC Glucose: 213 mg/dl — AB (ref 70–99)

## 2021-02-07 MED ORDER — MIRTAZAPINE 30 MG PO TABS
ORAL_TABLET | ORAL | 3 refills | Status: AC
  Filled 2021-02-07: qty 45, 90d supply, fill #0

## 2021-02-07 MED ORDER — GLIPIZIDE ER 5 MG PO TB24
5.0000 mg | ORAL_TABLET | Freq: Every day | ORAL | 3 refills | Status: DC
Start: 2021-02-07 — End: 2022-01-21
  Filled 2021-02-07 – 2021-05-31 (×2): qty 90, 90d supply, fill #0
  Filled 2021-05-31 – 2021-07-23 (×3): qty 90, 90d supply, fill #1

## 2021-02-07 NOTE — Progress Notes (Signed)
Ana Hines was seen today for diabetes.  Diagnoses and all orders for this visit:  Encounter for annual physical exam  Type 2 diabetes mellitus with other specified complication, without long-term current use of insulin (HCC) -     glipiZIDE (GLUCOTROL XL) 5 MG 24 hr tablet; Take 1 tablet (5 mg total) by mouth daily with breakfast. -     POCT glucose (manual entry) -     POCT glycosylated hemoglobin (Hb A1C) -     CMP14+EGFR Continue blood sugar control as discussed in office today, low carbohydrate diet, and regular physical exercise as tolerated, 150 minutes per week (30 min each day, 5 days per week, or 50 min 3 days per week). Keep blood sugar logs with fasting goal of 90-130 mg/dl, post prandial (after you eat) less than 180.  For Hypoglycemia: BS <60 and Hyperglycemia BS >400; contact the clinic ASAP. Annual eye exams and foot exams are recommended.   Primary hypertension Continue all antihypertensives as prescribed.  Remember to bring in your blood pressure log with you for your follow up appointment.  DASH/Mediterranean Diets are healthier choices for HTN.    Anxiety and depression -     mirtazapine (REMERON) 30 MG tablet; TAKE 1/2 (ONE-HALF) TABLET BY MOUTH EVERY DAY AT BEDTIME  Breast cancer screening by mammogram -     MS DIGITAL SCREENING TOMO BILATERAL; Future  Need for pneumococcal vaccination -     Pneumococcal conjugate vaccine 20-valent (Prevnar 20)   Subjective:   Chief Complaint  Patient presents with   Diabetes   HPI Ana Hines 70 y.o. female presents to office today for physical exam. She has a past medical history of Asthma, DM 2 (20 years ago), Hyperlipidemia, and Osteoporosis.   DM 2 Well controlled with glipizide 5 mg daily, Jardiance 10 mg daily and Trulicity 1.5 mg weekly. LDL at goal with atorvastatin 40 mg daily.  Lab Results  Component Value Date   HGBA1C 6.8 (A) 02/07/2021    Lab Results  Component Value Date   HGBA1C 9.4 (A)  10/04/2020    Lab Results  Component Value Date   LDLCALC 63 10/04/2020     HTN Blood pressure is well controlled.  BP Readings from Last 3 Encounters:  02/07/21 136/84  10/19/20 139/66  10/04/20 115/66    Review of Systems  Constitutional:  Negative for fever, malaise/fatigue and weight loss.  HENT: Negative.  Negative for nosebleeds.   Eyes: Negative.  Negative for blurred vision, double vision and photophobia.  Respiratory: Negative.  Negative for cough and shortness of breath.   Cardiovascular: Negative.  Negative for chest pain, palpitations and leg swelling.  Gastrointestinal: Negative.  Negative for heartburn, nausea and vomiting.  Genitourinary: Negative.   Musculoskeletal: Negative.  Negative for myalgias.  Skin: Negative.   Neurological: Negative.  Negative for dizziness, focal weakness, seizures and headaches.  Endo/Heme/Allergies: Negative.   Psychiatric/Behavioral: Negative.  Negative for suicidal ideas.    Past Medical History:  Diagnosis Date   Asthma    Diabetes mellitus without complication ([place]) 20 years ago   Hyperlipidemia    Osteoporosis     Past Surgical History:  Procedure Laterality Date   TUBAL LIGATION      Family History  Problem Relation Age of Onset   Breast cancer Sister    Ovarian cancer Cousin    Asthma Other    Cancer Other    Hypertension Other    Diabetes Other     Social  History Reviewed with no changes to be made today.   Outpatient Medications Prior to Visit  Medication Sig Dispense Refill   Accu-Chek Softclix Lancets lancets Use as instructed twice daily 100 each 3   alendronate (FOSAMAX) 70 MG tablet Take with a full glass of water on an empty stomach once a week 4 tablet 2   aspirin EC 81 MG tablet Take 1 tablet (81 mg total) by mouth daily. 30 tablet 11   atorvastatin (LIPITOR) 40 MG tablet TAKE 1 TABLET (40 MG TOTAL) BY MOUTH DAILY. 90 tablet 1   Blood Glucose Monitoring Suppl (ACCU-CHEK GUIDE) w/Device KIT use as  directed twice daily 1 kit 0   Calcium Citrate 200 MG TABS Take 2 tablets (400 mg total) by mouth daily. 60 tablet 11   Cholecalciferol (VITAMIN D3) 2000 units TABS Take 2,000 Units by mouth daily. 30 tablet 11   Dulaglutide (TRULICITY) 1.5 OJ/5.0KX SOPN INJECT 1.5MG  SUBCUTANEOUSLY ONCE A WEEK . APPOINTMENT REQUIRED FOR FUTURE REFILLS 2 mL 2   empagliflozin (JARDIANCE) 10 MG TABS tablet Take 1 tablet (10 mg total) by mouth daily before breakfast. 30 tablet 2   glucose blood (ACCU-CHEK GUIDE) test strip USE AS DIRECTED TWICE DAILY 100 each 2   Insulin Pen Needle (B-D UF III MINI PEN NEEDLES) 31G X 5 MM MISC Use as instructed. Inject into the skin once nightly. E11.65 100 each 1   PFIZER-BIONT COVID-19 VAC-TRIS SUSP injection      glipiZIDE (GLUCOTROL XL) 5 MG 24 hr tablet Take 1 tablet (5 mg total) by mouth daily with breakfast. 30 tablet 2   lisinopril (ZESTRIL) 2.5 MG tablet TAKE 1 TABLET (2.5 MG TOTAL) BY MOUTH DAILY. 90 tablet 0   mirtazapine (REMERON) 30 MG tablet TAKE 1/2 (ONE-HALF) TABLET BY MOUTH EVERY DAY AT BEDTIME 45 tablet 0   No facility-administered medications prior to visit.    Allergies  Allergen Reactions   Sulfa Antibiotics Hives    Has patient had a PCN reaction causing immediate rash, facial/tongue/throat swelling, SOB or lightheadedness with hypotension: Unknown Has patient had a PCN reaction causing severe rash involving mucus membranes or skin necrosis: Unknown Has patient had a PCN reaction that required hospitalization: Unknown Has patient had a PCN reaction occurring within the last 10 years: Unknown If all of the above answers are "NO", then may proceed with Cephalosporin use.    Sulfonamide Derivatives        Objective:    BP 136/84   Pulse 94   Ht _0  (1.753 m)   Wt 93 lb (42.2 kg)   SpO2 100%   BMI 13.73 kg/m  Wt Readings from Last 3 Encounters:  02/07/21 93 lb (42.2 kg)  10/19/20 94 lb (42.6 kg)  10/04/20 94 lb 6.4 oz (42.8 kg)    Physical  Exam Constitutional:      Appearance: She is underweight.  HENT:     Head: Normocephalic and atraumatic.     Right Ear: Hearing, ear canal and external ear normal. Tympanic membrane is scarred.     Left Ear: Hearing, ear canal and external ear normal. Tympanic membrane is scarred.     Nose: Nose normal.     Mouth/Throat:     Pharynx: No oropharyngeal exudate.  Eyes:     General: Lids are normal. No scleral icterus.       Right eye: No discharge.     Extraocular Movements: Extraocular movements intact.     Conjunctiva/sclera: Conjunctivae normal.  Pupils: Pupils are equal, round, and reactive to light.  Neck:     Thyroid: No thyromegaly.     Trachea: No tracheal deviation.  Cardiovascular:     Rate and Rhythm: Normal rate and regular rhythm.     Heart sounds: Normal heart sounds. No murmur heard.   No friction rub.  Pulmonary:     Effort: Pulmonary effort is normal. No accessory muscle usage or respiratory distress.     Breath sounds: Normal breath sounds. No decreased breath sounds, wheezing, rhonchi or rales.  Abdominal:     General: Bowel sounds are normal. There is no distension.     Palpations: Abdomen is soft. There is no mass.     Tenderness: There is no abdominal tenderness. There is no guarding or rebound.  Musculoskeletal:        General: No tenderness or deformity. Normal range of motion.     Cervical back: Normal range of motion and neck supple.  Lymphadenopathy:     Cervical: No cervical adenopathy.  Skin:    General: Skin is warm and dry.     Findings: No erythema.  Neurological:     Mental Status: She is alert and oriented to person, place, and time.     Cranial Nerves: Cranial nerves are intact. No cranial nerve deficit.     Sensory: Sensation is intact.     Motor: Motor function is intact.     Coordination: Coordination is intact. Coordination normal.     Gait: Gait is intact.     Deep Tendon Reflexes:     Reflex Scores:      Patellar reflexes are 1+  on the right side and 1+ on the left side. Psychiatric:        Speech: Speech normal.        Behavior: Behavior normal.        Thought Content: Thought content normal.        Judgment: Judgment normal.         Patient has been counseled extensively about nutrition and exercise as well as the importance of adherence with medications and regular follow-up. The patient was given clear instructions to go to ER or return to medical center if symptoms don't improve, worsen or new problems develop. The patient verbalized understanding.   Follow-up: Return in about 3 months (around 05/10/2021).   [name], FNP-BC [facility] [place], [place]   02/07/2021, 9:54 PM

## 2021-02-09 ENCOUNTER — Telehealth: Payer: Self-pay

## 2021-02-09 LAB — CMP14+EGFR
ALT: 40 IU/L — ABNORMAL HIGH (ref 0–32)
AST: 40 IU/L (ref 0–40)
Albumin/Globulin Ratio: 1.8 (ref 1.2–2.2)
Albumin: 4.9 g/dL — ABNORMAL HIGH (ref 3.8–4.8)
Alkaline Phosphatase: 71 IU/L (ref 44–121)
BUN/Creatinine Ratio: 14 (ref 12–28)
BUN: 11 mg/dL (ref 8–27)
Bilirubin Total: <0.2 mg/dL (ref 0.0–1.2)
CO2: 20 mmol/L (ref 20–29)
Calcium: 10.4 mg/dL — ABNORMAL HIGH (ref 8.7–10.3)
Chloride: 103 mmol/L (ref 96–106)
Creatinine, Ser: 0.77 mg/dL (ref 0.57–1.00)
Globulin, Total: 2.8 g/dL (ref 1.5–4.5)
Glucose: 176 mg/dL — ABNORMAL HIGH (ref 70–99)
Potassium: 3.9 mmol/L (ref 3.5–5.2)
Sodium: 143 mmol/L (ref 134–144)
Total Protein: 7.7 g/dL (ref 6.0–8.5)
eGFR: 83 mL/min/{1.73_m2} (ref >59)

## 2021-02-09 NOTE — Telephone Encounter (Signed)
-----   Message from Claiborne Rigg, NP sent at 02/09/2021 12:33 PM EDT ----- Labs are essentially normal. There are some minor variations in your blood work that do not require any additional work up at this time. Will continue to monitor.  Normal kidney and liver function.

## 2021-02-12 ENCOUNTER — Other Ambulatory Visit: Payer: Self-pay

## 2021-02-13 ENCOUNTER — Other Ambulatory Visit: Payer: Self-pay

## 2021-02-19 ENCOUNTER — Other Ambulatory Visit: Payer: Self-pay

## 2021-02-19 ENCOUNTER — Ambulatory Visit (INDEPENDENT_AMBULATORY_CARE_PROVIDER_SITE_OTHER): Payer: Medicare HMO | Admitting: Podiatry

## 2021-02-19 ENCOUNTER — Encounter: Payer: Self-pay | Admitting: Podiatry

## 2021-02-19 DIAGNOSIS — R0989 Other specified symptoms and signs involving the circulatory and respiratory systems: Secondary | ICD-10-CM | POA: Diagnosis not present

## 2021-02-19 DIAGNOSIS — M79674 Pain in right toe(s): Secondary | ICD-10-CM | POA: Diagnosis not present

## 2021-02-19 DIAGNOSIS — E1151 Type 2 diabetes mellitus with diabetic peripheral angiopathy without gangrene: Secondary | ICD-10-CM | POA: Diagnosis not present

## 2021-02-19 DIAGNOSIS — B351 Tinea unguium: Secondary | ICD-10-CM

## 2021-02-19 DIAGNOSIS — M79675 Pain in left toe(s): Secondary | ICD-10-CM

## 2021-02-19 DIAGNOSIS — E119 Type 2 diabetes mellitus without complications: Secondary | ICD-10-CM

## 2021-02-20 ENCOUNTER — Other Ambulatory Visit: Payer: Self-pay

## 2021-02-20 ENCOUNTER — Other Ambulatory Visit: Payer: Self-pay | Admitting: Nurse Practitioner

## 2021-02-20 DIAGNOSIS — Z1231 Encounter for screening mammogram for malignant neoplasm of breast: Secondary | ICD-10-CM

## 2021-02-22 NOTE — Progress Notes (Signed)
  Subjective:  Patient ID: Ana Hines, female    DOB: [birth year],  MRN: [id number]  70 y.o. female presents with at risk foot care. Pt has h/o NIDDM with PAD and thick, elongated toenails both feet which are tender when wearing enclosed shoe gear..    Patient's blood sugar was 124 mg/dl today.  She states she never received a call to schedule her lower extremity arterial studies.  PCP: Claiborne Rigg, NP and last visit was: 02/07/2021.  Review of Systems: Negative except as noted in the HPI.   Allergies  Allergen Reactions   Sulfa Antibiotics Hives    Has patient had a PCN reaction causing immediate rash, facial/tongue/throat swelling, SOB or lightheadedness with hypotension: Unknown Has patient had a PCN reaction causing severe rash involving mucus membranes or skin necrosis: Unknown Has patient had a PCN reaction that required hospitalization: Unknown Has patient had a PCN reaction occurring within the last 10 years: Unknown If all of the above answers are "NO", then may proceed with Cephalosporin use.    Sulfonamide Derivatives     Objective:  There were no vitals filed for this visit. Constitutional Patient is a pleasant 70 y.o. African American female thin build in NAD. AAO x 3.  Vascular Capillary fill time to digits <4 seconds.  DP/PT pulse(s) are nonpalpable b/l lower extremities. Pedal hair absent b/l. Lower extremity skin temperature gradient warm to cool b/l. No pain with calf compression b/l. No edema noted b/l lower extremities. No cyanosis or clubbing noted. No ischemia or gangrene noted b/l lower extremities.  Neurologic Pt has subjective symptoms of neuropathy. Protective sensation intact 5/5 intact bilaterally with 10g monofilament b/l. Vibratory sensation intact b/l.  Dermatologic Pedal skin thin, shiny and atrophic b/l LE. Toenails 1-5 b/l elongated, discolored, dystrophic, thickened, crumbly with subungual debris and tenderness to dorsal palpation. No  hyperkeratotic nor porokeratotic lesions present on today's visit.  Orthopedic: Patient ambulates independent of any assistive aids. Normal muscle strength 5/5 to all lower extremity muscle groups bilaterally. Hammertoe(s) noted to the R 2nd toe.   Hemoglobin A1C Latest Ref Rng & Units 02/07/2021 10/04/2020  HGBA1C 4.0 - 5.6 % 6.8(A) 9.4(A)  Some recent data might be hidden   Assessment:   1. Pain due to onychomycosis of toenails of both feet   2. Diminished pulses in lower extremity   3. Type II diabetes mellitus with peripheral circulatory disorder [facility])    Plan:  Patient was evaluated and treated and all questions answered. Consent given for treatment as described below: -Examined patient. -Reordered noninvasive arterial studies segmentals and ABIs for b/l lower extremities for diagnosis of diabetes with diminished pulses b/l LE. -Continue diabetic foot care principles: inspect feet daily, monitor glucose as recommended by PCP and/or Endocrinologist, and follow prescribed diet per PCP, Endocrinologist and/or dietician. -Patient/POA to call should there be question/concern in the interim.  Return in about 3 months (around 05/22/2021).  Freddie Breech, DPM

## 2021-02-23 ENCOUNTER — Ambulatory Visit
Admission: RE | Admit: 2021-02-23 | Discharge: 2021-02-23 | Disposition: A | Discharge status: Home / Self Care | Payer: Medicare HMO | Source: Ambulatory Visit | Attending: Nurse Practitioner | Admitting: Nurse Practitioner

## 2021-02-23 ENCOUNTER — Other Ambulatory Visit: Payer: Self-pay

## 2021-02-23 DIAGNOSIS — Z1231 Encounter for screening mammogram for malignant neoplasm of breast: Secondary | ICD-10-CM | POA: Diagnosis not present

## 2021-02-27 ENCOUNTER — Other Ambulatory Visit: Payer: Self-pay | Admitting: Nurse Practitioner

## 2021-02-27 DIAGNOSIS — R928 Other abnormal and inconclusive findings on diagnostic imaging of breast: Secondary | ICD-10-CM

## 2021-02-28 ENCOUNTER — Other Ambulatory Visit: Payer: Self-pay | Admitting: Nurse Practitioner

## 2021-02-28 DIAGNOSIS — R928 Other abnormal and inconclusive findings on diagnostic imaging of breast: Secondary | ICD-10-CM

## 2021-03-07 ENCOUNTER — Other Ambulatory Visit: Payer: Self-pay

## 2021-03-09 ENCOUNTER — Other Ambulatory Visit: Payer: Self-pay

## 2021-03-19 ENCOUNTER — Other Ambulatory Visit: Payer: Self-pay

## 2021-03-20 ENCOUNTER — Ambulatory Visit
Admission: RE | Admit: 2021-03-20 | Discharge: 2021-03-20 | Disposition: A | Discharge status: Home / Self Care | Payer: Medicare HMO | Source: Ambulatory Visit | Attending: Nurse Practitioner | Admitting: Nurse Practitioner

## 2021-03-20 ENCOUNTER — Other Ambulatory Visit: Payer: Self-pay

## 2021-03-20 DIAGNOSIS — R921 Mammographic calcification found on diagnostic imaging of breast: Secondary | ICD-10-CM | POA: Diagnosis not present

## 2021-03-20 DIAGNOSIS — R928 Other abnormal and inconclusive findings on diagnostic imaging of breast: Secondary | ICD-10-CM

## 2021-03-20 DIAGNOSIS — R922 Inconclusive mammogram: Secondary | ICD-10-CM | POA: Diagnosis not present

## 2021-03-23 ENCOUNTER — Telehealth: Payer: Self-pay

## 2021-03-23 NOTE — Telephone Encounter (Signed)
Called patient reviewed all information and repeated back to me. Will call if any questions.  ? ?

## 2021-03-23 NOTE — Telephone Encounter (Signed)
-----   Message from Claiborne Rigg, NP sent at 03/22/2021  6:17 PM EST ----- Normal mammogram. Repeat in 1 year.

## 2021-04-03 ENCOUNTER — Other Ambulatory Visit: Payer: Self-pay

## 2021-04-24 ENCOUNTER — Other Ambulatory Visit: Payer: Self-pay

## 2021-04-24 DIAGNOSIS — M810 Age-related osteoporosis without current pathological fracture: Secondary | ICD-10-CM

## 2021-04-27 ENCOUNTER — Other Ambulatory Visit: Payer: Self-pay | Admitting: Nurse Practitioner

## 2021-04-27 DIAGNOSIS — M810 Age-related osteoporosis without current pathological fracture: Secondary | ICD-10-CM

## 2021-04-27 NOTE — Telephone Encounter (Signed)
Medication Refill - Medication: alendronate (FOSAMAX) 70 MG tablet   Has the patient contacted their pharmacy? Yes.   (Agent: If no, request that the patient contact the pharmacy for the refill. If patient does not wish to contact the pharmacy document the reason why and proceed with request.) (Agent: If yes, when and what did the pharmacy advise?)  Preferred Pharmacy (with phone number or street name):  [facility] - [place], [place] - [address]  [address] [place] [address]  Phone: [phone] Fax: [phone]   Has the patient been seen for an appointment in the last year OR does the patient have an upcoming appointment? Yes.    Agent: Please be advised that RX refills may take up to 3 business days. We ask that you follow-up with your pharmacy.

## 2021-04-27 NOTE — Telephone Encounter (Signed)
Requested medication (s) are due for refill today: Yes  Requested medication (s) are on the active medication list: Yes  Last refill:  01/10/21  Future visit scheduled: No  Notes to clinic:  Protocol indicates lab work is needed.    Requested Prescriptions  Pending Prescriptions Disp Refills   alendronate (FOSAMAX) 70 MG tablet 4 tablet 2    Sig: Take with a full glass of water on an empty stomach once a week     Endocrinology:  Bisphosphonates Failed - 04/27/2021  3:23 PM      Failed - Ca in normal range and within 360 days    Calcium  Date Value Ref Range Status  02/07/2021 10.4 (H) 8.7 - 10.3 mg/dL Final   Calcium, Ion  Date Value Ref Range Status  05/17/2014 1.12 (L) 1.13 - 1.30 mmol/L Final          Failed - Vitamin D in normal range and within 360 days    Vit D, 25-Hydroxy  Date Value Ref Range Status  05/12/2017 33.3 30.0 - 100.0 ng/mL Final    Comment:    Vitamin D deficiency has been defined by the Institute of Medicine and an Endocrine Society practice guideline as a level of serum 25-OH vitamin D less than 20 ng/mL (1,2). The Endocrine Society went on to further define vitamin D insufficiency as a level between 21 and 29 ng/mL (2). 1. IOM (Institute of Medicine). 2010. Dietary reference    intakes for calcium and D. [place]: The    [employer]. 2. Holick MF, Binkley [place], Bischoff-Ferrari HA, et al.    Evaluation, treatment, and prevention of vitamin D    deficiency: an Endocrine Society clinical practice    guideline. JCEM. 2011 Jul; 96(7):1911-30.           Passed - Valid encounter within last 12 months    Recent Outpatient Visits           2 months ago Encounter for annual physical exam   [place], [place] W, NP   3 months ago Type 2 diabetes mellitus with other specified complication, unspecified whether long term insulin use [facility])   [place],  Annie Main L, RPH-CPP   4 months ago Type 2 diabetes mellitus with other specified complication, unspecified whether long term insulin use [facility])   [place], Annie Main L, RPH-CPP   5 months ago Type 2 diabetes mellitus with other specified complication, unspecified whether long term insulin use [facility])   [place], Annie Main L, RPH-CPP   6 months ago Type 2 diabetes mellitus with other specified complication, unspecified whether long term insulin use [facility])   [place], Vernia Buff, NP

## 2021-04-29 MED ORDER — ALENDRONATE SODIUM 70 MG PO TABS: ORAL_TABLET | ORAL | 2 refills | Status: AC

## 2021-04-30 ENCOUNTER — Other Ambulatory Visit: Payer: Self-pay

## 2021-04-30 ENCOUNTER — Other Ambulatory Visit: Payer: Self-pay | Admitting: Family Medicine

## 2021-04-30 MED ORDER — EMPAGLIFLOZIN 10 MG PO TABS
10.0000 mg | ORAL_TABLET | Freq: Every day | ORAL | 2 refills | Status: AC
  Filled 2021-04-30 – 2021-05-11 (×2): qty 30, 30d supply, fill #0
  Filled 2021-06-14: qty 30, 30d supply, fill #1

## 2021-04-30 NOTE — Telephone Encounter (Signed)
Requested Prescriptions  °Pending Prescriptions Disp Refills  °• empagliflozin (JARDIANCE) 10 MG TABS tablet 30 tablet 2  °  Sig: Take 1 tablet (10 mg total) by mouth daily before breakfast.  °  ° Endocrinology:  Diabetes - SGLT2 Inhibitors Failed - 04/30/2021  9:39 AM  °  °  Failed - AA eGFR in normal range and within 360 days  °  GFR calc Af Amer  °Date Value Ref Range Status  °11/26/2019 100 >59 mL/min/1.73 Final  °  Comment:  °  **[facility] currently reports eGFR in compliance with the current** °  recommendations of the National Kidney Foundation. [facility] will °  update reporting as new guidelines are published from the NKF-ASN °  Task force. °  ° °GFR calc non Af Amer  °Date Value Ref Range Status  °11/26/2019 86 >59 mL/min/1.73 Final  ° °eGFR  °Date Value Ref Range Status  °02/07/2021 83 >59 mL/min/1.73 Final  °   °  °  Passed - Cr in normal range and within 360 days  °  Creatinine, Ser  °Date Value Ref Range Status  °02/07/2021 0.77 0.57 - 1.00 mg/dL Final  ° °Creatinine, Urine  °Date Value Ref Range Status  °06/24/2016 113 20 - 320 mg/dL Final  °   °  °  Passed - LDL in normal range and within 360 days  °  LDL Chol Calc (NIH)  °Date Value Ref Range Status  °10/04/2020 63 0 - 99 mg/dL Final  °   °  °  Passed - HBA1C is between 0 and 7.9 and within 180 days  °  Hemoglobin A1C  °Date Value Ref Range Status  °02/07/2021 6.8 (A) 4.0 - 5.6 % Final  ° °HbA1c, POC (controlled diabetic range)  °Date Value Ref Range Status  °10/04/2020 9.4 (A) 0.0 - 7.0 % Final  °   °  °  Passed - Valid encounter within last 6 months  °  Recent Outpatient Visits   °      ° 2 months ago Encounter for annual physical exam  ° [facility] [place], Zelda W, NP  ° 3 months ago Type 2 diabetes mellitus with other specified complication, unspecified whether long term insulin use (HCC)  ° [facility] [place], Stephen L, RPH-CPP  ° 5 months ago Type 2 diabetes mellitus with other  specified complication, unspecified whether long term insulin use (HCC)  ° [facility] [place], Stephen L, RPH-CPP  ° 5 months ago Type 2 diabetes mellitus with other specified complication, unspecified whether long term insulin use (HCC)  ° [place] [facility] [place], Stephen L, RPH-CPP  ° 6 months ago Type 2 diabetes mellitus with other specified complication, unspecified whether long term insulin use (HCC)  °  [facility] [place], Zelda W, NP  °  °  ° °  °  °  ° °

## 2021-05-04 ENCOUNTER — Other Ambulatory Visit: Payer: Self-pay

## 2021-05-11 ENCOUNTER — Other Ambulatory Visit: Payer: Self-pay

## 2021-05-31 ENCOUNTER — Other Ambulatory Visit: Payer: Self-pay

## 2021-06-01 ENCOUNTER — Ambulatory Visit: Payer: Medicare HMO | Admitting: Podiatry

## 2021-06-04 ENCOUNTER — Other Ambulatory Visit: Payer: Self-pay

## 2021-06-14 ENCOUNTER — Other Ambulatory Visit: Payer: Self-pay

## 2021-06-15 ENCOUNTER — Other Ambulatory Visit: Payer: Self-pay

## 2021-06-15 ENCOUNTER — Other Ambulatory Visit: Payer: Self-pay | Admitting: Family Medicine

## 2021-06-15 ENCOUNTER — Other Ambulatory Visit (HOSPITAL_BASED_OUTPATIENT_CLINIC_OR_DEPARTMENT_OTHER): Payer: Self-pay

## 2021-06-15 DIAGNOSIS — M810 Age-related osteoporosis without current pathological fracture: Secondary | ICD-10-CM

## 2021-06-25 ENCOUNTER — Other Ambulatory Visit: Payer: Self-pay

## 2021-06-26 ENCOUNTER — Other Ambulatory Visit: Payer: Self-pay

## 2021-07-04 ENCOUNTER — Other Ambulatory Visit: Payer: Self-pay

## 2021-07-05 ENCOUNTER — Other Ambulatory Visit: Payer: Self-pay

## 2021-07-06 ENCOUNTER — Ambulatory Visit
Admission: RE | Admit: 2021-07-06 | Discharge: 2021-07-06 | Disposition: A | Discharge status: Home / Self Care | Payer: Medicare HMO | Source: Ambulatory Visit | Attending: Nurse Practitioner | Admitting: Nurse Practitioner

## 2021-07-06 DIAGNOSIS — M8589 Other specified disorders of bone density and structure, multiple sites: Secondary | ICD-10-CM | POA: Diagnosis not present

## 2021-07-06 DIAGNOSIS — M810 Age-related osteoporosis without current pathological fracture: Secondary | ICD-10-CM

## 2021-07-11 DIAGNOSIS — E43 Unspecified severe protein-calorie malnutrition: Secondary | ICD-10-CM | POA: Diagnosis not present

## 2021-07-11 DIAGNOSIS — N3281 Overactive bladder: Secondary | ICD-10-CM | POA: Diagnosis not present

## 2021-07-11 DIAGNOSIS — L309 Dermatitis, unspecified: Secondary | ICD-10-CM | POA: Diagnosis not present

## 2021-07-11 DIAGNOSIS — E118 Type 2 diabetes mellitus with unspecified complications: Secondary | ICD-10-CM | POA: Diagnosis not present

## 2021-07-11 DIAGNOSIS — Z23 Encounter for immunization: Secondary | ICD-10-CM | POA: Diagnosis not present

## 2021-07-11 DIAGNOSIS — F329 Major depressive disorder, single episode, unspecified: Secondary | ICD-10-CM | POA: Diagnosis not present

## 2021-07-11 DIAGNOSIS — Z0000 Encounter for general adult medical examination without abnormal findings: Secondary | ICD-10-CM | POA: Diagnosis not present

## 2021-07-11 DIAGNOSIS — R636 Underweight: Secondary | ICD-10-CM | POA: Diagnosis not present

## 2021-07-11 DIAGNOSIS — M810 Age-related osteoporosis without current pathological fracture: Secondary | ICD-10-CM | POA: Diagnosis not present

## 2021-07-17 ENCOUNTER — Other Ambulatory Visit: Payer: Self-pay

## 2021-07-17 ENCOUNTER — Ambulatory Visit: Payer: Medicare HMO | Attending: Nurse Practitioner | Admitting: Nurse Practitioner

## 2021-07-17 NOTE — Progress Notes (Signed)
Ana Hines states she scheuled an appt as a courtesy call to let me know she has found another primary care due to her insurance changes. She thanked me for all I have done for her. I wished her well.  ?

## 2021-07-18 ENCOUNTER — Other Ambulatory Visit: Payer: Self-pay

## 2021-07-23 ENCOUNTER — Other Ambulatory Visit: Payer: Self-pay

## 2021-09-21 ENCOUNTER — Ambulatory Visit: Payer: Medicare PPO | Admitting: Podiatry

## 2022-01-21 ENCOUNTER — Ambulatory Visit (INDEPENDENT_AMBULATORY_CARE_PROVIDER_SITE_OTHER): Payer: Medicare HMO

## 2022-01-21 ENCOUNTER — Ambulatory Visit (INDEPENDENT_AMBULATORY_CARE_PROVIDER_SITE_OTHER): Payer: Medicare HMO | Admitting: Podiatry

## 2022-01-21 ENCOUNTER — Encounter: Payer: Self-pay | Admitting: Podiatry

## 2022-01-21 DIAGNOSIS — R0989 Other specified symptoms and signs involving the circulatory and respiratory systems: Secondary | ICD-10-CM

## 2022-01-21 DIAGNOSIS — L97511 Non-pressure chronic ulcer of other part of right foot limited to breakdown of skin: Secondary | ICD-10-CM

## 2022-01-21 DIAGNOSIS — M79674 Pain in right toe(s): Secondary | ICD-10-CM | POA: Diagnosis not present

## 2022-01-21 DIAGNOSIS — E11621 Type 2 diabetes mellitus with foot ulcer: Secondary | ICD-10-CM

## 2022-01-21 DIAGNOSIS — B351 Tinea unguium: Secondary | ICD-10-CM

## 2022-01-21 DIAGNOSIS — E1151 Type 2 diabetes mellitus with diabetic peripheral angiopathy without gangrene: Secondary | ICD-10-CM

## 2022-01-21 DIAGNOSIS — M79675 Pain in left toe(s): Secondary | ICD-10-CM | POA: Diagnosis not present

## 2022-01-21 MED ORDER — DOXYCYCLINE HYCLATE 100 MG PO CAPS: 100.0000 mg | ORAL_CAPSULE | Freq: Two times a day (BID) | ORAL | 0 refills | Status: AC

## 2022-01-21 MED ORDER — POVIDONE-IODINE 10 % EX SOLN: 1.0000 | CUTANEOUS | 0 refills | Status: AC | PRN

## 2022-01-21 NOTE — Patient Instructions (Signed)
DRESSING CHANGES RIGHT FOOT:   PHARMACY SHOPPING LIST: Saline or Wound Cleanser for cleaning wound 2 x 2 inch sterile gauze for cleaning wound BETADINE SOLUTION  A. IF DISPENSED, WEAR SURGICAL SHOE OR WALKING BOOT AT ALL TIMES.  B. IF PRESCRIBED ORAL ANTIBIOTICS, TAKE ALL MEDICATION AS PRESCRIBED UNTIL ALL ARE GONE.   1. KEEP RIGHT FOOT DRY AT ALL TIMES!!!!  2. CLEANSE ULCER WITH SALINE OR WOUND CLEANSER.  3. DAB DRY WITH GAUZE SPONGE.  4. APPLY A LIGHT AMOUNT OF BETADINE SOLUTION TO BASE OF ULCER.  5. APPLY OUTER DRESSING AS INSTRUCTED.  6. WEAR SURGICAL SHOE/BOOT DAILY AT ALL TIMES. IF SUPPLIED, WEAR HEEL PROTECTORS AT ALL TIMES WHEN IN BED.  7. DO NOT WALK BAREFOOT!!!  8.  IF YOU EXPERIENCE ANY FEVER, CHILLS, NIGHTSWEATS, NAUSEA OR VOMITING, ELEVATED OR LOW BLOOD SUGARS, REPORT TO EMERGENCY ROOM.  9. IF YOU EXPERIENCE INCREASED REDNESS, PAIN, SWELLING, DISCOLORATION, ODOR, PUS, DRAINAGE OR WARMTH OF YOUR FOOT, REPORT TO EMERGENCY ROOM.

## 2022-01-21 NOTE — Progress Notes (Signed)
Ana Hines, Ana risk foot care. Pt has h/o NIDDM with PAD, and painful elongated mycotic toenails 1-5 bilaterally which are tender when wearing enclosed shoe gear. Pain is relieved with periodic professional debridement..  Chief Complaint  Patient presents with   Nail Problem    Diabetic foot care BS-Did not check today A1C-6.? PCP-Beverly Brown PCP VST-01/15/2022   Patient confirms h/o diabetes.  Patient relates 10 year h/o diabetes.  Patient denies any h/o foot wounds.  Patient denies any numbness, tingling, burning, or pins/needle sensation in feet.  Risk factors:  weight loss, diabetes, PAD, HTN, hyperlipidemia.  Patient states she is working with her PCP on smoking cessation plan. Today, she relates she missed her last appointment with me and has just returned from [place]. She was in [place] from June 16-January 14, 2022. Patient relates she started to experience pain to right 4th webspace in July. Pain is described as tender. She denies any redness, drainage or swelling of the area. Denies any fever, chills, night sweats, nausea or vomiting. She has not initiated any treatment to the area other than to keep it clean.  [name], NP is patient's PCP. Last visit was February 07, 2021.  Past Medical History:  Diagnosis Date   Asthma    Diabetes mellitus without complication ([place]) 20 years ago   Hyperlipidemia    Osteoporosis    Patient Active Problem List   Diagnosis Date Noted   Weight loss 05/27/2018   Subclinical hyperthyroidism 05/27/2018   Type 2 diabetes mellitus with other specified complication ([place]) 89/38/1017   Skin plaque 01/23/2015   Vitamin D deficiency 06/27/2014   Underweight 06/24/2014   Onychomycosis of toenail 05/27/2014   Lung nodule 05/18/2014   Compression fracture of T12 vertebra ([place]) 05/17/2014   Osteoporosis 05/25/2010   Past Surgical  History:  Procedure Laterality Date   TUBAL LIGATION     Current Outpatient Medications on File Prior to Visit  Medication Sig Dispense Refill   Accu-Chek Softclix Lancets lancets Use as instructed twice daily 100 each 3   alendronate (FOSAMAX) 70 MG tablet Take with a full glass of water on an empty stomach once a week 4 tablet 2   aspirin EC 81 MG tablet Take 1 tablet (81 mg total) by mouth daily. 30 tablet 11   atorvastatin (LIPITOR) 40 MG tablet TAKE 1 TABLET (40 MG TOTAL) BY MOUTH DAILY. 90 tablet 1   Blood Glucose Monitoring Suppl (ACCU-CHEK GUIDE) w/Device KIT use as directed twice daily 1 kit 0   Calcium Citrate 200 MG TABS Take 2 tablets (400 mg total) by mouth daily. 60 tablet 11   Cholecalciferol (VITAMIN D3) 2000 units TABS Take 2,000 Units by mouth daily. 30 tablet 11   empagliflozin (JARDIANCE) 10 MG TABS tablet Take 1 tablet (10 mg total) by mouth daily before breakfast. 30 tablet 2   folic acid (FOLVITE) 1 MG tablet Take 1 mg by mouth daily.     glucose blood (ACCU-CHEK GUIDE) test strip USE AS DIRECTED TWICE DAILY 100 each 2   hydrochlorothiazide (HYDRODIURIL) 12.5 MG tablet Take 12.5 mg by mouth daily.     Insulin Pen Needle (B-D UF III MINI PEN NEEDLES) 31G X 5 MM MISC Use as instructed. Inject into the skin once nightly. E11.65 100 each 1   mirtazapine (REMERON) 30 MG tablet TAKE 1/2 (ONE-HALF) TABLET BY MOUTH EVERY DAY Ana BEDTIME 45 tablet 3  MYRBETRIQ 25 MG TB24 tablet Take 25 mg by mouth daily.     PFIZER-BIONT COVID-19 VAC-TRIS SUSP injection      No current facility-administered medications on file prior to visit.    Allergies  Allergen Reactions   Lisinopril     Other reaction(s): Angioedema   Sulfa Antibiotics Hives    Has patient had a PCN reaction causing immediate rash, facial/tongue/throat swelling, SOB or lightheadedness with hypotension: Unknown Has patient had a PCN reaction causing severe rash involving mucus membranes or skin necrosis: Unknown Has  patient had a PCN reaction that required hospitalization: Unknown Has patient had a PCN reaction occurring within the last 10 years: Unknown If all of the above answers are "NO", then may proceed with Cephalosporin use.    Sulfonamide Derivatives    Social History   Occupational History   Not on file  Tobacco Use   Smoking status: Every Day    Packs/day: 0.50    Types: Cigarettes   Smokeless tobacco: Never  Substance and Sexual Activity   Alcohol use: Yes    Alcohol/week: 2.0 - 3.0 standard drinks of alcohol    Types: 2 - 3 Standard drinks or equivalent per week   Drug use: Yes    Types: Marijuana    Comment: Occasionally   Sexual activity: Not on file   Family History  Problem Relation Age of Onset   Breast cancer Sister    Ovarian cancer Cousin    Asthma Other    Cancer Other    Hypertension Other    Diabetes Other    Immunization History  Administered Date(s) Administered   PFIZER(Purple Top)SARS-COV-2 Vaccination 08/24/2019, 09/16/2019   PNEUMOCOCCAL CONJUGATE-20 02/07/2021   Pneumococcal Conjugate-13 05/12/2017   Pneumococcal Polysaccharide-23 07/08/2018   Td 04/23/1995     Review of Systems: Negative except as noted in the HPI.   Objective: There were no vitals filed for this visit.  Ana Hines is a pleasant 71 y.o. female in NAD. AAO X 3.  Vascular Hines: CFT <4 seconds b/l. DP pulses diminished b/l. PT pulses diminished b/l. Digital hair absent. Skin temperature gradient warm to cool b/l. No ischemia or gangrene. No cyanosis or clubbing noted b/l. No edema noted b/l LE.   Neurological Hines: Sensation grossly intact b/l with 10 gram monofilament. Vibratory sensation intact b/l.   Dermatological Hines: Pedal skin thin, shiny and atrophic b/l. Toenails 1-5 b/l thick, discolored, elongated with subungual debris and pain on dorsal palpation.      Wound Location: 4th webspace right foot There is a small amount of devitalized  hyperkeratotic tissue present in the wound. Predebridement Wound Measurement:   Postdebridement Wound Measurement: 0.2 x 0.1 x 0.2 cm to level of subcutaneous tissue. I cannot palpate bone. I cannot appreciate any tracking nor tunneling into deep tissue today. Odor: There is no odor from the ulceration. Wound Base: fibrotic Peri-wound: Normal Exudate: None: wound tissue dry Blood Loss during debridement: 0 cc('s). Material in wound which inhibits healing/promotes adjacent tissue breakdown:  nonviable hyperkeratosis. Description of tissue removed from ulceration today:  nonviable hyperkeratosis. Sign(s) of clinical bacterial infection: no clinical signs of infection noted on Hines today.  There is no drainage to culture.  Musculoskeletal Hines: Muscle strength 5/5 to b/l LE. 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 Patient ambulates independent of any assistive aids.  No gas in tissues right foot. Contracted digits 2-5 right foot. No bone erosion noted Ana location of ulceration PIPJ right 5th toe.  No evidence of fracture right foot. No foreign body evident right foot.  Last A1c:      Latest Ref Rng & Units 02/07/2021    3:28 PM  Hemoglobin A1C  Hemoglobin-A1c 4.0 - 5.6 % 6.8    Footwear Assessment: Does the patient wear appropriate shoes? No. Nike sneakers are too narrow Ana toe box for her foot deformity. Patient related understanding. Does the patient need inserts/orthotics? No.  ADA Risk Categorization:  High Risk  Patient has one or more of the following: Loss of protective sensation Absent pedal pulses Severe Foot deformity History of foot ulcer  Assessment: 1. Pain due to onychomycosis of toenails of both feet   2. Diabetic ulcer of right foot associated with type 2 diabetes mellitus, limited to breakdown of skin, unspecified part of foot ([place])   3. Diminished pulses in lower extremity   4. Type II diabetes mellitus with peripheral circulatory  disorder ([facility])   Plan: Meds ordered this encounter  Medications   povidone-iodine (BETADINE) 10 % external solution    Sig: Apply 1 Application topically as needed for wound care.    Dispense:  118 mL    Refill:  0   doxycycline (VIBRAMYCIN) 100 MG capsule    Sig: Take 1 capsule (100 mg total) by mouth 2 (two) times daily for 10 days.    Dispense:  20 capsule    Refill:  0   -Patient was evaluated and treated. All patient's and/or POA's questions/concerns answered on today's visit. -Due to risk factor(s), ordered noninvasive arterial studies ABIs with and without TBIs for b/l lower extremities. -Patient was evaluated and treated and all questions answered.  -Patient/POA/Family member educated on diagnosis and treatment plan of routine ulcer debridement/wound care.  -Ulceration debridement achieved utilizing sharp excisional debridement to level of subcutaneous tissue with sterile currette.. Type/amount of devitalized tissue removed: nonviable hyperkeratosis -Today's ulcer size post-debridement: 0.2 x 0.1 x 0.1 cm. -Ulceration cleansed with wound cleanser. Betadine Solution applied to base of ulceration and secured with light dressing. -Wound responded well to today's debridement. -Patient risk factors affecting healing of ulcer: diabetes, PAD, HTN, CAD, hyperlipidemia, current tobacco user, foot deformity -[name] given written instructions on daily wound care for 4th webspace right foot ulceration. -Rx for Doxycyline 100 mg, #20, to be taken one capsule twice daily for 10 days. -Radiology ordered today: X-Ray: Right foot. Xrays taken right foot and reviewed with patient in office. -Medicare ABN signed for [place] shoe today. -Medicaid ABN signed for [employer] shoe today. -Diabetic foot Hines performed today. -Discussed diabetic foot care principles. Literature dispensed on today. -Mycotic toenails 1-5 bilaterally were debrided in length and girth with sterile nail nippers and  dremel without incident. -Patient/POA to call should there be question/concern in the interim. -Patient to see Dr. Amalia Hailey in one week for follow up of diabetic ulceration right 4th webspace.Return in about 1 week (around 01/28/2022).  Marzetta Board, DPM

## 2022-01-31 ENCOUNTER — Ambulatory Visit (HOSPITAL_COMMUNITY)
Admission: RE | Admit: 2022-01-31 | Discharge: 2022-01-31 | Disposition: A | Discharge status: Home / Self Care | Payer: Medicare HMO | Source: Ambulatory Visit | Attending: Podiatry | Admitting: Podiatry

## 2022-01-31 DIAGNOSIS — E11621 Type 2 diabetes mellitus with foot ulcer: Secondary | ICD-10-CM | POA: Insufficient documentation

## 2022-01-31 DIAGNOSIS — L97511 Non-pressure chronic ulcer of other part of right foot limited to breakdown of skin: Secondary | ICD-10-CM | POA: Diagnosis present

## 2022-02-04 ENCOUNTER — Ambulatory Visit (INDEPENDENT_AMBULATORY_CARE_PROVIDER_SITE_OTHER): Payer: Medicare HMO | Admitting: Podiatry

## 2022-02-04 DIAGNOSIS — E11621 Type 2 diabetes mellitus with foot ulcer: Secondary | ICD-10-CM | POA: Diagnosis not present

## 2022-02-04 DIAGNOSIS — L97511 Non-pressure chronic ulcer of other part of right foot limited to breakdown of skin: Secondary | ICD-10-CM | POA: Diagnosis not present

## 2022-02-04 NOTE — Progress Notes (Signed)
   Chief Complaint  Patient presents with   wound care    Patient is here for  diabetic ulcer 4th webspace right foot, patient states that she was to return in 1 week for follow-up.    HPI: 71 y.o. female presenting today for follow-up evaluation of an interdigital ulcer between the fourth and fifth digits of the right foot.  Patient was last seen in the office 01/21/2022 with Dr. Adah Perl.  She has been applying the antibiotic cream and soaking her foot as instructed.  No new complaints at this time  Past Medical History:  Diagnosis Date   Asthma    Diabetes mellitus without complication ([place]) 20 years ago   Hyperlipidemia    Osteoporosis     Past Surgical History:  Procedure Laterality Date   TUBAL LIGATION      Allergies  Allergen Reactions   Lisinopril     Other reaction(s): Angioedema   Sulfa Antibiotics Hives    Has patient had a PCN reaction causing immediate rash, facial/tongue/throat swelling, SOB or lightheadedness with hypotension: Unknown Has patient had a PCN reaction causing severe rash involving mucus membranes or skin necrosis: Unknown Has patient had a PCN reaction that required hospitalization: Unknown Has patient had a PCN reaction occurring within the last 10 years: Unknown If all of the above answers are "NO", then may proceed with Cephalosporin use.    Sulfonamide Derivatives      Physical Exam: General: The patient is alert and oriented x3 in no acute distress.  Dermatology: The ulcer to the interdigital area between the fourth and fifth digits of the right foot has resolved and healed completely.  No open wound noted.  There is some hyperkeratotic debris which was debrided today  Vascular: Palpable pedal pulses bilaterally. Capillary refill within normal limits.  Negative for any significant edema or erythema  Neurological: Light touch and protective threshold test  Musculoskeletal Exam: No pedal deformities noted  Assessment: 1.  Ulcer fourth  interdigital webspace RT foot; healed 2.  Diabetes mellitus with peripheral polyneuropathy   Plan of Care:  1. Patient evaluated. X-Rays reviewed.  2.  Light debridement of the area was performed today using a tissue nipper without incident or bleeding.  Again, the wound is healed and resolved completely 3.  Patient may discontinue postsurgical shoe 4.  Return to clinic next scheduled appointment with Dr. April Holding, DPM [facility]  Dr. Edrick Kins, DPM    [address], [place] [address]                Office [phone]  Fax [phone]

## 2022-02-14 ENCOUNTER — Other Ambulatory Visit (INDEPENDENT_AMBULATORY_CARE_PROVIDER_SITE_OTHER): Payer: Medicare HMO | Admitting: Podiatry

## 2022-02-14 DIAGNOSIS — L97511 Non-pressure chronic ulcer of other part of right foot limited to breakdown of skin: Secondary | ICD-10-CM

## 2022-02-14 DIAGNOSIS — E11621 Type 2 diabetes mellitus with foot ulcer: Secondary | ICD-10-CM

## 2022-02-14 DIAGNOSIS — R0989 Other specified symptoms and signs involving the circulatory and respiratory systems: Secondary | ICD-10-CM

## 2022-02-14 DIAGNOSIS — I739 Peripheral vascular disease, unspecified: Secondary | ICD-10-CM

## 2022-02-14 NOTE — Progress Notes (Signed)
Patient with h/o diabetes and ulcer between right 4th and 5th digits. History of HTN and hyperlipidemia. Order entered for Vascular consultation.  LOWER EXTREMITY DOPPLER STUDY   Patient Name:  Ana Hines  Date of Exam:   01/31/2022  Medical Rec #: [id number]        Accession #:    [id number]  Date of Birth: June 25, 1950         Patient Gender: F  Patient Age:   71 years  Exam Location:  [facility]  Procedure:      VAS [place] ABI WITH/WO TBI  Referring Phys:    ---------------------------------------------------------------------------  -----     Indications: Slow healing sore between the 4th and 5th digits of the right               toes.   High Risk Factors: Hyperlipidemia, Diabetes, current smoker.     Performing Technologist: Dorthula Matas RVS, RCS      Examination Guidelines: A complete evaluation includes at minimum, Doppler  waveform signals and systolic blood pressure reading at the level of  bilateral  brachial, anterior tibial, and posterior tibial arteries, when vessel  segments  are accessible. Bilateral testing is considered an integral part of a  complete  examination. Photoelectric Plethysmograph (PPG) waveforms and toe systolic  pressure readings are included as required and additional duplex testing  as  needed. Limited examinations for reoccurring indications may be performed  as  noted.      ABI Findings:  +---------+------------------+-----+--------+--------+  Right    Rt Pressure (mmHg)IndexWaveformComment   +---------+------------------+-----+--------+--------+  Brachial 134                                      +---------+------------------+-----+--------+--------+  PTA      133               0.99 biphasic          +---------+------------------+-----+--------+--------+  DP       130               0.97 biphasic          +---------+------------------+-----+--------+--------+  Great Toe75                 0.56                   +---------+------------------+-----+--------+--------+   +---------+------------------+-----+--------+-------+  Left     Lt Pressure (mmHg)IndexWaveformComment  +---------+------------------+-----+--------+-------+  Brachial 132                                     +---------+------------------+-----+--------+-------+  PTA      134               1.00 biphasic         +---------+------------------+-----+--------+-------+  DP       135               1.01 biphasic         +---------+------------------+-----+--------+-------+  Great Toe92                0.69                  +---------+------------------+-----+--------+-------+   +-------+-----------+-----------+------------+------------+  ABI/TBIToday's ABIToday's TBIPrevious ABIPrevious TBI  +-------+-----------+-----------+------------+------------+  Right  0.99  0.56                                 +-------+-----------+-----------+------------+------------+  Left   1.01       0.69                                 +-------+-----------+-----------+------------+------------+             Summary:  Right: Resting right ankle-brachial index is within normal range. The  right toe-brachial index is abnormal.   Left: Resting left ankle-brachial index is within normal range. The left  toe-brachial index is borderline abnormal.    *See table(s) above for measurements and observations.

## 2022-02-18 ENCOUNTER — Encounter: Payer: Medicare HMO | Admitting: Surgery

## 2022-02-20 ENCOUNTER — Encounter: Payer: Self-pay | Admitting: Vascular Surgery

## 2022-02-20 ENCOUNTER — Ambulatory Visit (INDEPENDENT_AMBULATORY_CARE_PROVIDER_SITE_OTHER): Payer: BC Managed Care – PPO | Admitting: Vascular Surgery

## 2022-02-20 VITALS — BP 133/74 | HR 78 | Temp 98.3°F | Resp 20 | Ht 69.0 in | Wt 92.0 lb

## 2022-02-20 DIAGNOSIS — I739 Peripheral vascular disease, unspecified: Secondary | ICD-10-CM | POA: Diagnosis not present

## 2022-02-20 NOTE — Progress Notes (Signed)
Patient ID: Ana Hines, female   DOB: Jul 07, 1950, 71 y.o.   MRN: [id number]  Reason for Consult: New Patient (Initial Visit)   Referred by Willene Hatchet, NP  Subjective:     HPI:  Ana Hines is a 71 y.o. female with history of hyperlipidemia and diabetes and is a current everyday smoker.  She recently had an ulcer on the right foot about her fourth toe but this is healed without intervention.  She states that she does occasionally get cramps but not any frank claudication.  She denies tissue loss or ulceration at the current time.  No history of stroke, TIA or amaurosis no personal or family history of aneurysm disease.  Denies any previous vascular invention.  Past Medical History:  Diagnosis Date   Asthma    Diabetes mellitus without complication ([place]) 20 years ago   Hyperlipidemia    Osteoporosis    Family History  Problem Relation Age of Onset   Breast cancer Sister    Ovarian cancer Cousin    Asthma Other    Cancer Other    Hypertension Other    Diabetes Other    Past Surgical History:  Procedure Laterality Date   TUBAL LIGATION      Short Social History:  Social History   Tobacco Use   Smoking status: Every Day    Packs/day: 0.25    Types: Cigarettes   Smokeless tobacco: Never  Substance Use Topics   Alcohol use: Yes    Alcohol/week: 2.0 - 3.0 standard drinks of alcohol    Types: 2 - 3 Standard drinks or equivalent per week    Allergies  Allergen Reactions   Lisinopril     Other reaction(s): Angioedema   Sulfa Antibiotics Hives    Has patient had a PCN reaction causing immediate rash, facial/tongue/throat swelling, SOB or lightheadedness with hypotension: Unknown Has patient had a PCN reaction causing severe rash involving mucus membranes or skin necrosis: Unknown Has patient had a PCN reaction that required hospitalization: Unknown Has patient had a PCN reaction occurring within the last 10 years: Unknown If all of the above answers are  "NO", then may proceed with Cephalosporin use.    Sulfonamide Derivatives     Current Outpatient Medications  Medication Sig Dispense Refill   Accu-Chek Softclix Lancets lancets Use as instructed twice daily 100 each 3   alendronate (FOSAMAX) 70 MG tablet Take with a full glass of water on an empty stomach once a week 4 tablet 2   aspirin EC 81 MG tablet Take 1 tablet (81 mg total) by mouth daily. 30 tablet 11   Blood Glucose Monitoring Suppl (ACCU-CHEK GUIDE) w/Device KIT use as directed twice daily 1 kit 0   Calcium Citrate 200 MG TABS Take 2 tablets (400 mg total) by mouth daily. 60 tablet 11   Cholecalciferol (VITAMIN D3) 2000 units TABS Take 2,000 Units by mouth daily. 30 tablet 11   empagliflozin (JARDIANCE) 10 MG TABS tablet Take 1 tablet (10 mg total) by mouth daily before breakfast. 30 tablet 2   folic acid (FOLVITE) 1 MG tablet Take 1 mg by mouth daily.     glucose blood (ACCU-CHEK GUIDE) test strip USE AS DIRECTED TWICE DAILY 100 each 2   hydrochlorothiazide (HYDRODIURIL) 12.5 MG tablet Take 12.5 mg by mouth daily.     Insulin Pen Needle (B-D UF III MINI PEN NEEDLES) 31G X 5 MM MISC Use as instructed. Inject into the skin once nightly. E11.65  100 each 1   mirtazapine (REMERON) 30 MG tablet TAKE 1/2 (ONE-HALF) TABLET BY MOUTH EVERY DAY AT BEDTIME 45 tablet 3   MYRBETRIQ 25 MG TB24 tablet Take 25 mg by mouth daily.     PFIZER-BIONT COVID-19 VAC-TRIS SUSP injection      povidone-iodine (BETADINE) 10 % external solution Apply 1 Application topically as needed for wound care. 118 mL 0   varenicline (CHANTIX) 1 MG tablet Take 1 mg by mouth daily.     atorvastatin (LIPITOR) 40 MG tablet TAKE 1 TABLET (40 MG TOTAL) BY MOUTH DAILY. 90 tablet 1   No current facility-administered medications for this visit.    Review of Systems  Constitutional:  Constitutional negative. HENT: HENT negative.  Eyes: Eyes negative.  Respiratory: Respiratory negative.  Cardiovascular: Cardiovascular  negative.  GI: Gastrointestinal negative.  Musculoskeletal: Musculoskeletal negative.  Skin: Skin negative.  Neurological: Neurological negative. Hematologic: Hematologic/lymphatic negative.  Psychiatric: Psychiatric negative.        Objective:  Objective   Vitals:   02/20/22 1441  BP: 133/74  Pulse: 78  Resp: 20  Temp: 98.3 F (36.8 C)  SpO2: 95%  Weight: 92 lb (41.7 kg)  Height: _0  (1.753 m)   Body mass index is 13.59 kg/m.  Physical Exam HENT:     Head: Normocephalic.     Nose: Nose normal.  Eyes:     Pupils: Pupils are equal, round, and reactive to light.  Neck:     Vascular: No carotid bruit.  Cardiovascular:     Rate and Rhythm: Normal rate.     Pulses:          Femoral pulses are 2+ on the right side and 2+ on the left side.      Popliteal pulses are 2+ on the right side and 2+ on the left side.       Dorsalis pedis pulses are 1+ on the right side and 1+ on the left side.  Abdominal:     General: Abdomen is flat. There is no distension.     Palpations: Abdomen is soft. There is no mass.  Musculoskeletal:     Cervical back: Neck supple.     Right lower leg: No edema.     Left lower leg: No edema.  Skin:    General: Skin is warm and dry.     Capillary Refill: Capillary refill takes less than 2 seconds.  Neurological:     Mental Status: She is alert.  Psychiatric:        Mood and Affect: Mood normal.        Thought Content: Thought content normal.     Data: ABI Findings:  +---------+------------------+-----+--------+--------+  Right    Rt Pressure (mmHg)IndexWaveformComment   +---------+------------------+-----+--------+--------+  Brachial 134                                      +---------+------------------+-----+--------+--------+  PTA      133               0.99 biphasic          +---------+------------------+-----+--------+--------+  DP       130               0.97 biphasic           +---------+------------------+-----+--------+--------+  Quillian Quince  0.56                   +---------+------------------+-----+--------+--------+   +---------+------------------+-----+--------+-------+  Left     Lt Pressure (mmHg)IndexWaveformComment  +---------+------------------+-----+--------+-------+  Brachial 132                                     +---------+------------------+-----+--------+-------+  PTA      134               1.00 biphasic         +---------+------------------+-----+--------+-------+  DP       135               1.01 biphasic         +---------+------------------+-----+--------+-------+  Great Toe92                0.69                  +---------+------------------+-----+--------+-------+   +-------+-----------+-----------+------------+------------+  ABI/TBIToday's ABIToday's TBIPrevious ABIPrevious TBI  +-------+-----------+-----------+------------+------------+  Right  0.99       0.56                                 +-------+-----------+-----------+------------+------------+  Left   1.01       0.69                                 +-------+-----------+-----------+------------+------------+             Summary:  Right: Resting right ankle-brachial index is within normal range. The  right toe-brachial index is abnormal.   Left: Resting left ankle-brachial index is within normal range. The left  toe-brachial index is borderline abnormal.      Assessment/Plan:    71 year old female with recent history of healed wound on the right fourth toe and toe pressures to support wound healing.  I discussed the need for smoking cessation and protecting her feet.  Patient is considering moving to [facility] can see me on an as-needed basis and if needed referral to a vascular surgeon and [place] I can also assist with that in the future.     Waynetta Sandy MD [facility]

## 2022-04-17 ENCOUNTER — Other Ambulatory Visit: Payer: Self-pay

## 2022-05-02 ENCOUNTER — Other Ambulatory Visit: Payer: Self-pay

## 2022-05-03 ENCOUNTER — Other Ambulatory Visit: Payer: Self-pay

## 2022-06-11 IMAGING — CT CT CHEST LUNG CANCER SCREENING LOW DOSE W/O CM
2 of 4 series · 15 of 36 positions shown, 18 images · non-contrast
Comparison: None.

CLINICAL DATA: 69-year-old female. 22.5 pack-year history of
smoking.

EXAM:
CT CHEST WITHOUT CONTRAST LOW-DOSE FOR LUNG CANCER SCREENING
TECHNIQUE: Multidetector CT imaging of the chest was performed following the
standard protocol without IV contrast.

[Series 5: thins · axial · 0.65mm/px · z∈[+1075,+1382]mm · 12 of 421 slices shown, 15 images]
[im 19/421  mediastinal]
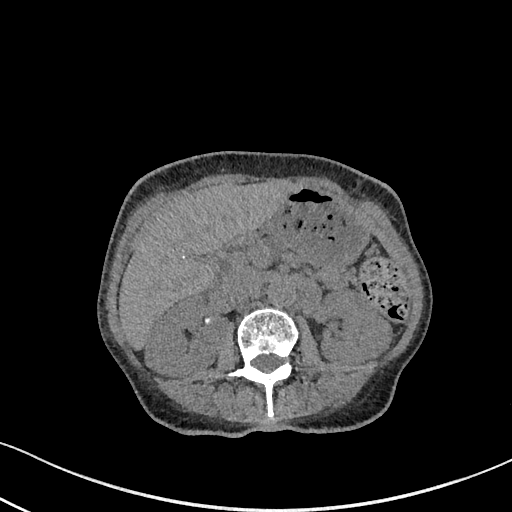
[im 19/421  lung]
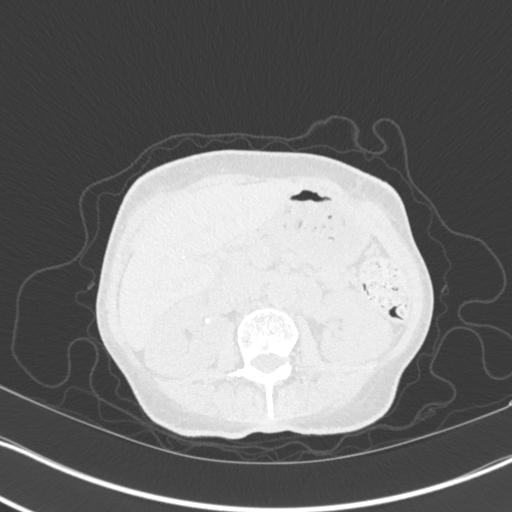
[im 55/421  lung]
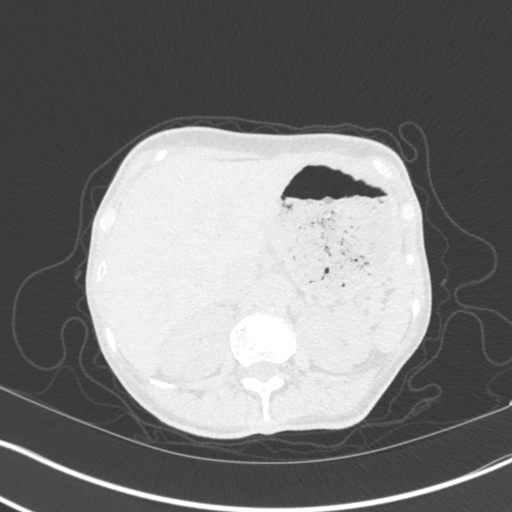
[im 92/421  lung]
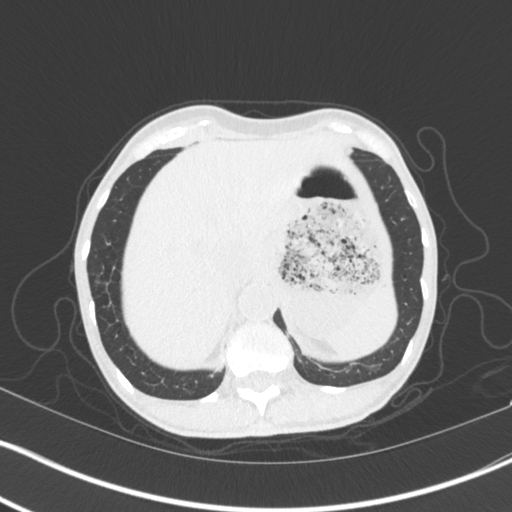
[im 128/421  lung]
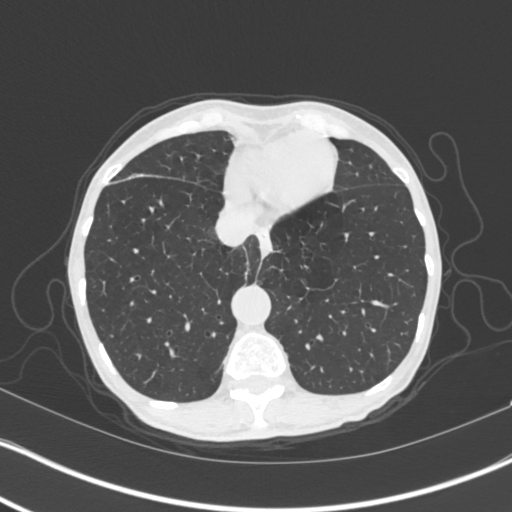
[im 165/421  mediastinal]
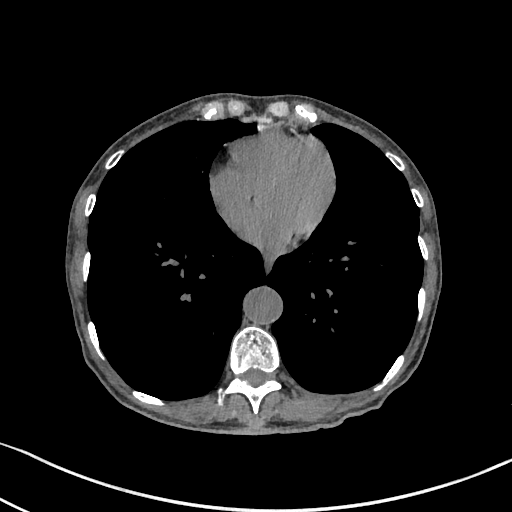
[im 165/421  lung]
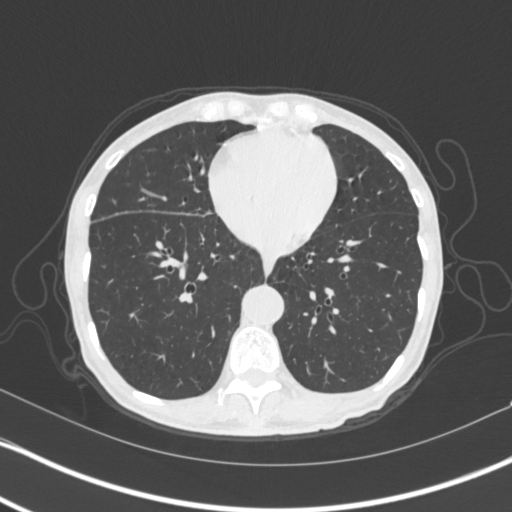
[im 201/421  lung]
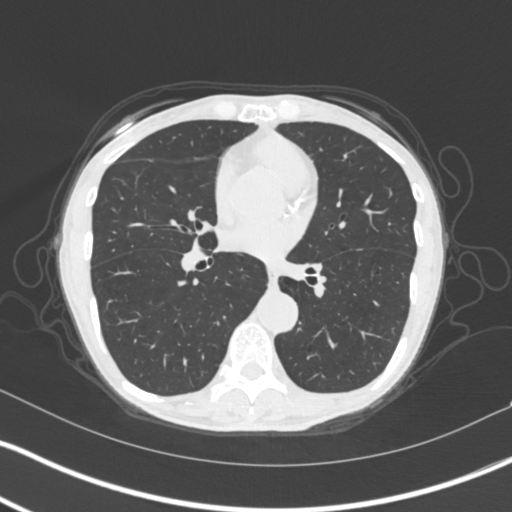
[im 220/421  lung]
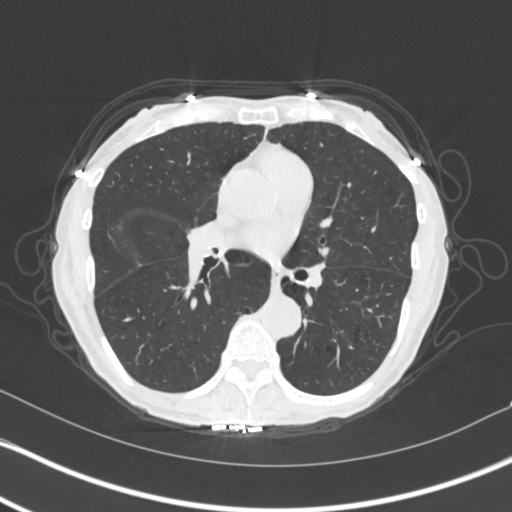
[im 256/421  lung]
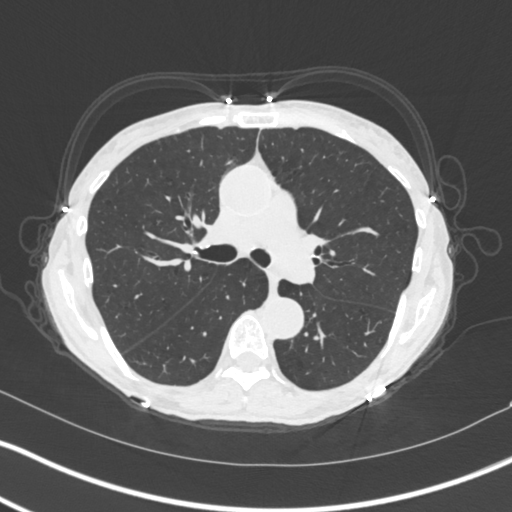
[im 293/421  mediastinal]
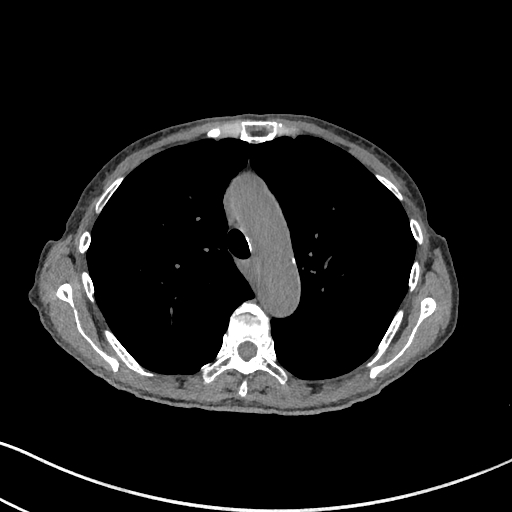
[im 293/421  lung]
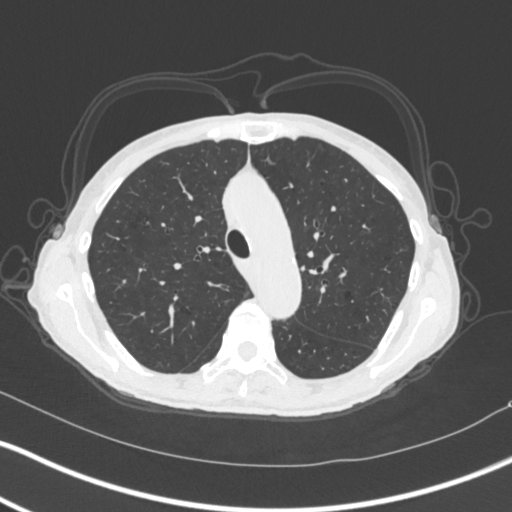
[im 329/421  lung]
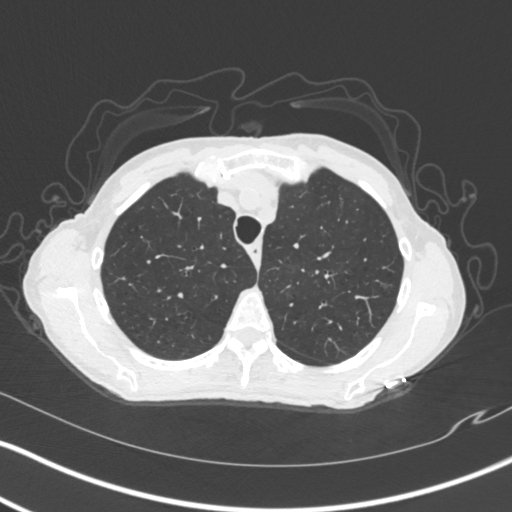
[im 366/421  lung]
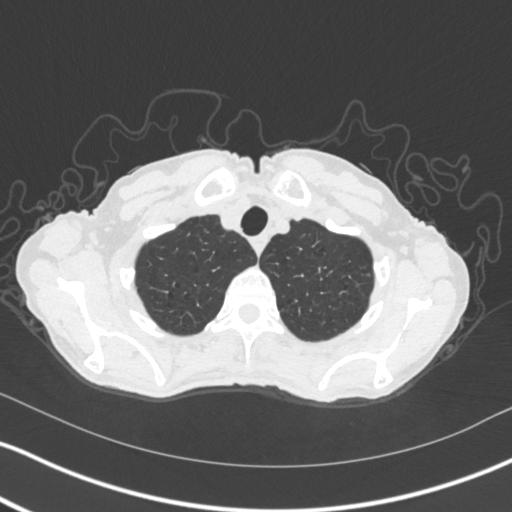
[im 402/421  lung]
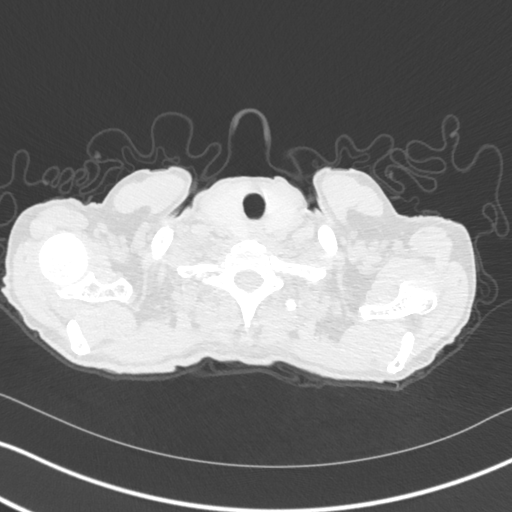

[Series 6: coronal · coronal · 0.57mm/px · 3 of 151 slices shown]
[im 31/151  lung]
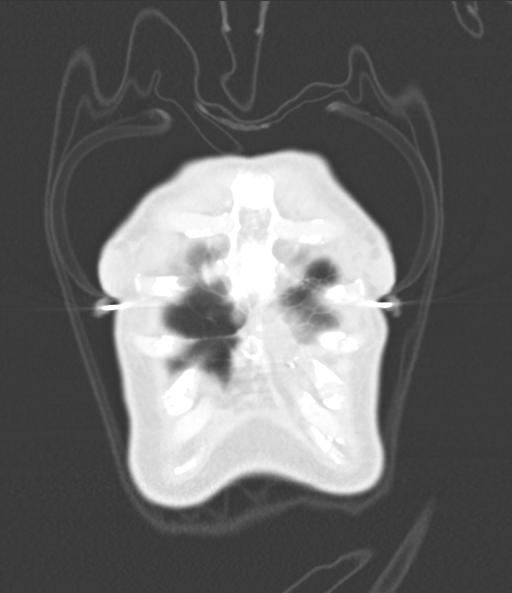
[im 61/151  lung]
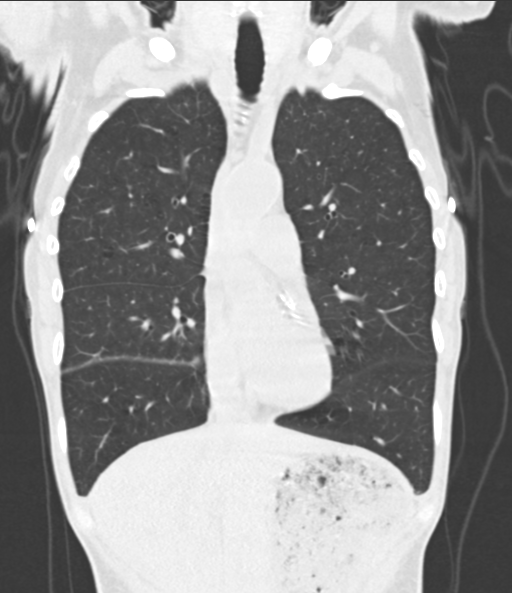
[im 91/151  lung]
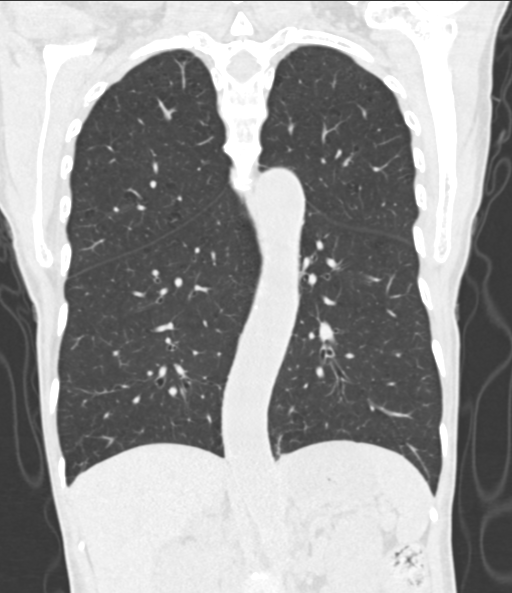

[15 of 36 positions shown; findings below may reference images not displayed]

FINDINGS: Cardiovascular: The heart size is normal. No substantial pericardial
effusion. Coronary artery calcification is evident. Atherosclerotic
calcification is noted in the wall of the thoracic aorta.

Mediastinum/Nodes: No mediastinal lymphadenopathy. No evidence for
gross hilar lymphadenopathy although assessment is limited by the
lack of intravenous contrast on today's study. The esophagus has
normal imaging features. There is no axillary lymphadenopathy.

Lungs/Pleura: Centrilobular and paraseptal emphysema evident.
Multiple pulmonary nodules identified measuring up to maximum volume
derived equivalent diameter of 4.4 mm. No overtly suspicious nodule
or mass. No focal airspace consolidation. No pleural effusion.

Upper Abdomen: Tiny hypodensity anterior liver is too small to
characterize but likely benign. Probable calcified 9 mm saccular
aneurysm distal right splenic artery.

Musculoskeletal: T12 anterior wedge compression deformity evident.
No worrisome lytic or sclerotic osseous abnormality.
IMPRESSION: 1. Lung-RADS 2, benign appearance or behavior. Continue annual
screening with low-dose chest CT without contrast in 12 months.
2. Probable calcified 9 mm saccular aneurysm distal right splenic
artery. Attention on follow-up recommended.
3. Aortic Atherosclerosis (II21G-PCT.T) and Emphysema (II21G-K2S.F).

## 2022-06-17 DIAGNOSIS — Z79899 Other long term (current) drug therapy: Secondary | ICD-10-CM | POA: Diagnosis not present

## 2022-06-17 DIAGNOSIS — F17200 Nicotine dependence, unspecified, uncomplicated: Secondary | ICD-10-CM | POA: Diagnosis not present

## 2022-06-17 DIAGNOSIS — L309 Dermatitis, unspecified: Secondary | ICD-10-CM | POA: Diagnosis not present

## 2022-08-14 DIAGNOSIS — Z79899 Other long term (current) drug therapy: Secondary | ICD-10-CM | POA: Diagnosis not present

## 2022-08-14 DIAGNOSIS — L309 Dermatitis, unspecified: Secondary | ICD-10-CM | POA: Diagnosis not present

## 2022-08-14 DIAGNOSIS — E46 Unspecified protein-calorie malnutrition: Secondary | ICD-10-CM | POA: Diagnosis not present

## 2022-08-14 DIAGNOSIS — E118 Type 2 diabetes mellitus with unspecified complications: Secondary | ICD-10-CM | POA: Diagnosis not present

## 2022-08-14 DIAGNOSIS — Z0000 Encounter for general adult medical examination without abnormal findings: Secondary | ICD-10-CM | POA: Diagnosis not present

## 2022-08-15 ENCOUNTER — Other Ambulatory Visit: Payer: Self-pay | Admitting: Nurse Practitioner

## 2022-08-15 DIAGNOSIS — Z1231 Encounter for screening mammogram for malignant neoplasm of breast: Secondary | ICD-10-CM

## 2022-08-19 DIAGNOSIS — Z7985 Long-term (current) use of injectable non-insulin antidiabetic drugs: Secondary | ICD-10-CM | POA: Diagnosis not present

## 2022-08-19 DIAGNOSIS — E119 Type 2 diabetes mellitus without complications: Secondary | ICD-10-CM | POA: Diagnosis not present

## 2022-08-19 DIAGNOSIS — H5203 Hypermetropia, bilateral: Secondary | ICD-10-CM | POA: Diagnosis not present

## 2022-08-19 DIAGNOSIS — Z961 Presence of intraocular lens: Secondary | ICD-10-CM | POA: Diagnosis not present

## 2022-08-19 DIAGNOSIS — Z7984 Long term (current) use of oral hypoglycemic drugs: Secondary | ICD-10-CM | POA: Diagnosis not present

## 2022-08-19 DIAGNOSIS — H524 Presbyopia: Secondary | ICD-10-CM | POA: Diagnosis not present

## 2022-08-23 ENCOUNTER — Ambulatory Visit (INDEPENDENT_AMBULATORY_CARE_PROVIDER_SITE_OTHER): Payer: Medicare HMO | Admitting: Podiatry

## 2022-08-23 DIAGNOSIS — M79674 Pain in right toe(s): Secondary | ICD-10-CM

## 2022-08-23 DIAGNOSIS — E0843 Diabetes mellitus due to underlying condition with diabetic autonomic (poly)neuropathy: Secondary | ICD-10-CM

## 2022-08-23 DIAGNOSIS — M79675 Pain in left toe(s): Secondary | ICD-10-CM | POA: Diagnosis not present

## 2022-08-23 DIAGNOSIS — I739 Peripheral vascular disease, unspecified: Secondary | ICD-10-CM

## 2022-08-23 DIAGNOSIS — M2141 Flat foot [pes planus] (acquired), right foot: Secondary | ICD-10-CM

## 2022-08-23 DIAGNOSIS — B351 Tinea unguium: Secondary | ICD-10-CM

## 2022-08-23 DIAGNOSIS — M19079 Primary osteoarthritis, unspecified ankle and foot: Secondary | ICD-10-CM

## 2022-08-23 DIAGNOSIS — M2142 Flat foot [pes planus] (acquired), left foot: Secondary | ICD-10-CM

## 2022-08-26 NOTE — Progress Notes (Addendum)
Chief Complaint  Patient presents with   Diabetes    Diabetic foot care, nail trim, A1c- 11.0 BG-209    SUBJECTIVE Patient with a history of diabetes mellitus presents to office today complaining of elongated, thickened nails that cause pain while ambulating in shoes.  Patient is unable to trim their own nails. Patient is here for further evaluation and treatment.  Past Medical History:  Diagnosis Date   Asthma    Diabetes mellitus without complication (HCC) 20 years ago   Hyperlipidemia    Osteoporosis     Allergies  Allergen Reactions   Lisinopril     Other reaction(s): Angioedema   Sulfa Antibiotics Hives    Has patient had a PCN reaction causing immediate rash, facial/tongue/throat swelling, SOB or lightheadedness with hypotension: Unknown Has patient had a PCN reaction causing severe rash involving mucus membranes or skin necrosis: Unknown Has patient had a PCN reaction that required hospitalization: Unknown Has patient had a PCN reaction occurring within the last 10 years: Unknown If all of the above answers are "NO", then may proceed with Cephalosporin use.    Sulfonamide Derivatives      OBJECTIVE General Patient is awake, alert, and oriented x 3 and in no acute distress. Derm Skin is dry and supple bilateral. Negative open lesions or macerations. Remaining integument unremarkable. Nails are tender, long, thickened and dystrophic with subungual debris, consistent with onychomycosis, 1-5 bilateral. No signs of infection noted. Vasc known history of PAD.  ABI 01/31/2022. ABI Findings:  +---------+------------------+-----+--------+--------+  Right   Rt Pressure (mmHg)IndexWaveformComment   +---------+------------------+-----+--------+--------+  Brachial 134                                      +---------+------------------+-----+--------+--------+  PTA     133               0.99 biphasic           +---------+------------------+-----+--------+--------+  DP      130               0.97 biphasic          +---------+------------------+-----+--------+--------+  Great Toe75                0.56                   +---------+------------------+-----+--------+--------+   +---------+------------------+-----+--------+-------+  Left    Lt Pressure (mmHg)IndexWaveformComment  +---------+------------------+-----+--------+-------+  Brachial 132                                     +---------+------------------+-----+--------+-------+  PTA     134               1.00 biphasic         +---------+------------------+-----+--------+-------+  DP      135               1.01 biphasic         +---------+------------------+-----+--------+-------+  Great Toe92                0.69                  +---------+------------------+-----+--------+-------+   +-------+-----------+-----------+------------+------------+  ABI/TBIToday's ABIToday's TBIPrevious ABIPrevious TBI  +-------+-----------+-----------+------------+------------+  Right 0.99       0.56                                 +-------+-----------+-----------+------------+------------+  Left  1.01       0.69                                 +-------+-----------+-----------+------------+------------+  Summary:  Right: Resting right ankle-brachial index is within normal range. The  right toe-brachial index is abnormal.  Left: Resting left ankle-brachial index is within normal range. The left  toe-brachial index is borderline abnormal.  Neuro light touch and protective threshold sensation diminished bilaterally.  Musculoskeletal Exam arthritic changes noted with collapse of the medial longitudinal arch and pes planus deformity noted  ASSESSMENT 1. Diabetes Mellitus; uncontrolled w/ peripheral neuropathy 2.  Pain due to onychomycosis of toenails bilateral 3.  Pes planus w/ DJD bilateral   PLAN  OF CARE 1. Patient evaluated today.  Stressed the importance of working closely with PCP to reduce diabetic glucose levels to more acceptable range. 2. Instructed to maintain good pedal hygiene and foot care. Stressed importance of controlling blood sugar.  3. Mechanical debridement of nails 1-5 bilaterally performed using a nail nipper. Filed with dremel without incident.  4. Return to clinic in 3 mos.     Felecia Shelling, DPM [facility]  Dr. Felecia Shelling, DPM    [address] [place], [place] [address]                Office [phone]  Fax [phone]

## 2022-09-13 ENCOUNTER — Other Ambulatory Visit: Payer: Self-pay | Admitting: Nurse Practitioner

## 2022-09-13 DIAGNOSIS — J432 Centrilobular emphysema: Secondary | ICD-10-CM

## 2022-09-18 ENCOUNTER — Ambulatory Visit: Payer: Medicare HMO | Admitting: Podiatry

## 2022-09-18 ENCOUNTER — Ambulatory Visit
Admission: RE | Admit: 2022-09-18 | Discharge: 2022-09-18 | Disposition: A | Discharge status: Home / Self Care | Payer: Medicare HMO | Source: Ambulatory Visit | Attending: Nurse Practitioner | Admitting: Nurse Practitioner

## 2022-09-18 DIAGNOSIS — E1151 Type 2 diabetes mellitus with diabetic peripheral angiopathy without gangrene: Secondary | ICD-10-CM

## 2022-09-18 DIAGNOSIS — Z1231 Encounter for screening mammogram for malignant neoplasm of breast: Secondary | ICD-10-CM

## 2022-09-18 NOTE — Progress Notes (Signed)
Patient presents to the office today for diabetic shoe and insole measuring.  Patient was measured with brannock device to determine size and width for 1 pair of extra depth shoes and foam casted for 3 pair of insoles.   ABN signed.   Documentation of medical necessity will be sent to patient's treating diabetic doctor to verify and sign.   Patient's diabetic provider:  Estevan Oaks, NP       Shoes and insoles will be ordered at that time and patient will be notified for an appointment for fitting when they arrive.   Brannock measurement: ***  Patient shoe selection-   1st   Shoe choice:   [id number]  Shoe size ordered: 9.5

## 2022-10-07 ENCOUNTER — Encounter: Payer: Self-pay | Admitting: Nurse Practitioner

## 2022-10-15 ENCOUNTER — Inpatient Hospital Stay: Admission: RE | Admit: 2022-10-15 | Payer: Medicare HMO | Source: Ambulatory Visit

## 2022-10-18 ENCOUNTER — Other Ambulatory Visit: Payer: Self-pay

## 2022-10-18 DIAGNOSIS — R911 Solitary pulmonary nodule: Secondary | ICD-10-CM

## 2022-10-20 NOTE — Addendum Note (Signed)
Addended by: Felecia Shelling on: 10/20/2022 05:52 PM   Modules accepted: Orders

## 2022-11-18 ENCOUNTER — Inpatient Hospital Stay: Admission: RE | Admit: 2022-11-18 | Payer: Medicare HMO | Source: Ambulatory Visit

## 2022-12-24 ENCOUNTER — Ambulatory Visit
Admission: RE | Admit: 2022-12-24 | Discharge: 2022-12-24 | Disposition: A | Discharge status: Home / Self Care | Payer: Medicare HMO | Source: Ambulatory Visit | Attending: Nurse Practitioner | Admitting: Nurse Practitioner

## 2022-12-24 DIAGNOSIS — J432 Centrilobular emphysema: Secondary | ICD-10-CM

## 2023-02-24 ENCOUNTER — Ambulatory Visit (INDEPENDENT_AMBULATORY_CARE_PROVIDER_SITE_OTHER): Payer: Medicare PPO | Admitting: Podiatry

## 2023-02-24 ENCOUNTER — Ambulatory Visit (INDEPENDENT_AMBULATORY_CARE_PROVIDER_SITE_OTHER): Payer: Medicare PPO

## 2023-02-24 ENCOUNTER — Encounter: Payer: Self-pay | Admitting: Podiatry

## 2023-02-24 DIAGNOSIS — B351 Tinea unguium: Secondary | ICD-10-CM

## 2023-02-24 DIAGNOSIS — M79675 Pain in left toe(s): Secondary | ICD-10-CM

## 2023-02-24 DIAGNOSIS — M779 Enthesopathy, unspecified: Secondary | ICD-10-CM

## 2023-02-24 DIAGNOSIS — S99921A Unspecified injury of right foot, initial encounter: Secondary | ICD-10-CM | POA: Diagnosis not present

## 2023-02-24 DIAGNOSIS — E119 Type 2 diabetes mellitus without complications: Secondary | ICD-10-CM | POA: Diagnosis not present

## 2023-02-24 DIAGNOSIS — M79674 Pain in right toe(s): Secondary | ICD-10-CM

## 2023-02-24 NOTE — Progress Notes (Signed)
Chief Complaint  Patient presents with   Diabetes    PATIENT STATES THAT THE COLOR OF HER RF HALLUXS HAS CHANGED AND SHE IS WORRIED ABOUT SOMETHING BEING WRONG AND WANTS DOCTOR TO LOOK AT IT. PATIENT STATES THAT SHE IS NOT IN ANY PAIN     SUBJECTIVE Patient with a history of diabetes mellitus presents to office today complaining of elongated, thickened nails that cause pain while ambulating in shoes.  Patient is unable to trim their own nails.   The patient also states that about 1 month ago she dropped something on her right great toe.  She has had pain and tenderness ever since.  Over the last month the pain has improved but she continues to have some pain and tenderness.  Patient is here for further evaluation and treatment.  Past Medical History:  Diagnosis Date   Asthma    Diabetes mellitus without complication (HCC) 20 years ago   Hyperlipidemia    Osteoporosis     Allergies  Allergen Reactions   Lisinopril     Other reaction(s): Angioedema   Sulfa Antibiotics Hives    Has patient had a PCN reaction causing immediate rash, facial/tongue/throat swelling, SOB or lightheadedness with hypotension: Unknown Has patient had a PCN reaction causing severe rash involving mucus membranes or skin necrosis: Unknown Has patient had a PCN reaction that required hospitalization: Unknown Has patient had a PCN reaction occurring within the last 10 years: Unknown If all of the above answers are "NO", then may proceed with Cephalosporin use.    Sulfonamide Derivatives      OBJECTIVE General Patient is awake, alert, and oriented x 3 and in no acute distress. Derm Skin is dry and supple bilateral. Negative open lesions or macerations. Remaining integument unremarkable. Nails are tender, long, thickened and dystrophic with subungual debris, consistent with onychomycosis, 1-5 bilateral. No signs of infection noted. Vasc  VAS [place] ABI WITH/WO TBI 01/31/2022 ABI Findings:   +---------+------------------+-----+--------+--------+  Right   Rt Pressure (mmHg)IndexWaveformComment   +---------+------------------+-----+--------+--------+  Brachial 134                                      +---------+------------------+-----+--------+--------+  PTA     133               0.99 biphasic          +---------+------------------+-----+--------+--------+  DP      130               0.97 biphasic          +---------+------------------+-----+--------+--------+  Great Toe75                0.56                   +---------+------------------+-----+--------+--------+   +---------+------------------+-----+--------+-------+  Left    Lt Pressure (mmHg)IndexWaveformComment  +---------+------------------+-----+--------+-------+  Brachial 132                                     +---------+------------------+-----+--------+-------+  PTA     134               1.00 biphasic         +---------+------------------+-----+--------+-------+  DP      135  1.01 biphasic         +---------+------------------+-----+--------+-------+  Great Toe92                0.69                  +---------+------------------+-----+--------+-------+   +-------+-----------+-----------+------------+------------+  ABI/TBIToday's ABIToday's TBIPrevious ABIPrevious TBI  +-------+-----------+-----------+------------+------------+  Right 0.99       0.56                                 +-------+-----------+-----------+------------+------------+  Left  1.01       0.69                                 +-------+-----------+-----------+------------+------------+  Summary:  Right: Resting right ankle-brachial index is within normal range. The right toe-brachial index is abnormal.  Left: Resting left ankle-brachial index is within normal range. The left toe-brachial index is borderline abnormal.   Musculoskeletal Exam No  symptomatic pedal deformities noted bilateral. Muscular strength within normal limits.  There is no pain or tenderness with palpation to the great toe joint specifically.  There is tenderness associated to the nail plate Radiographic exam RT foot 02/24/2023 moderate degenerative changes noted to the pedal joints of the foot.  No acute fractures identified.  Impression: Negative for fracture  ASSESSMENT 1. Diabetes Mellitus w/ peripheral neuropathy 2.  Pain due to onychomycosis of toenails bilateral 3.  Injury right great toe; resolved  PLAN OF CARE -Patient evaluated today.  X-rays reviewed -Comprehensive diabetic foot exam performed today -I do believe that the majority the patient's pain was associated to the nail plate of the right great toe.  After debridement she felt significant relief -Instructed to maintain good pedal hygiene and foot care. Stressed importance of controlling blood sugar.  -Mechanical debridement of nails 1-5 bilaterally performed using a nail nipper. Filed with dremel without incident.  -Return to clinic in 3 mos. routine footcare with Dr. Armond Hang, DPM [facility]  Dr. Felecia Shelling, DPM    [address] [place], [place] [address]                Office [phone]  Fax [phone]

## 2023-03-05 ENCOUNTER — Telehealth: Payer: Self-pay | Admitting: Podiatry

## 2023-03-05 NOTE — Telephone Encounter (Signed)
Pt was fitted in march for diabetic shoes and has not heard anything. Please call pt with information

## 2023-06-04 ENCOUNTER — Ambulatory Visit: Payer: Medicare PPO | Admitting: Podiatry

## 2023-06-04 DIAGNOSIS — Z91199 Patient's noncompliance with other medical treatment and regimen due to unspecified reason: Secondary | ICD-10-CM

## 2023-06-06 NOTE — Progress Notes (Signed)
1. No-show for appointment

## 2023-06-23 IMAGING — MG MM DIGITAL DIAGNOSTIC UNILAT*R*
3 series · 3 of 3 positions shown · non-contrast
Comparison: Previous exams including recent screening mammogram
dated 02/23/2021.

CLINICAL DATA: Patient returns today to evaluate RIGHT breast
calcifications identified on a recent screening mammogram.

EXAM:
DIGITAL DIAGNOSTIC UNILATERAL RIGHT MAMMOGRAM
TECHNIQUE: Right digital diagnostic mammography was performed. Mammographic
images were processed with CAD.

[R ML (1 of 2)]
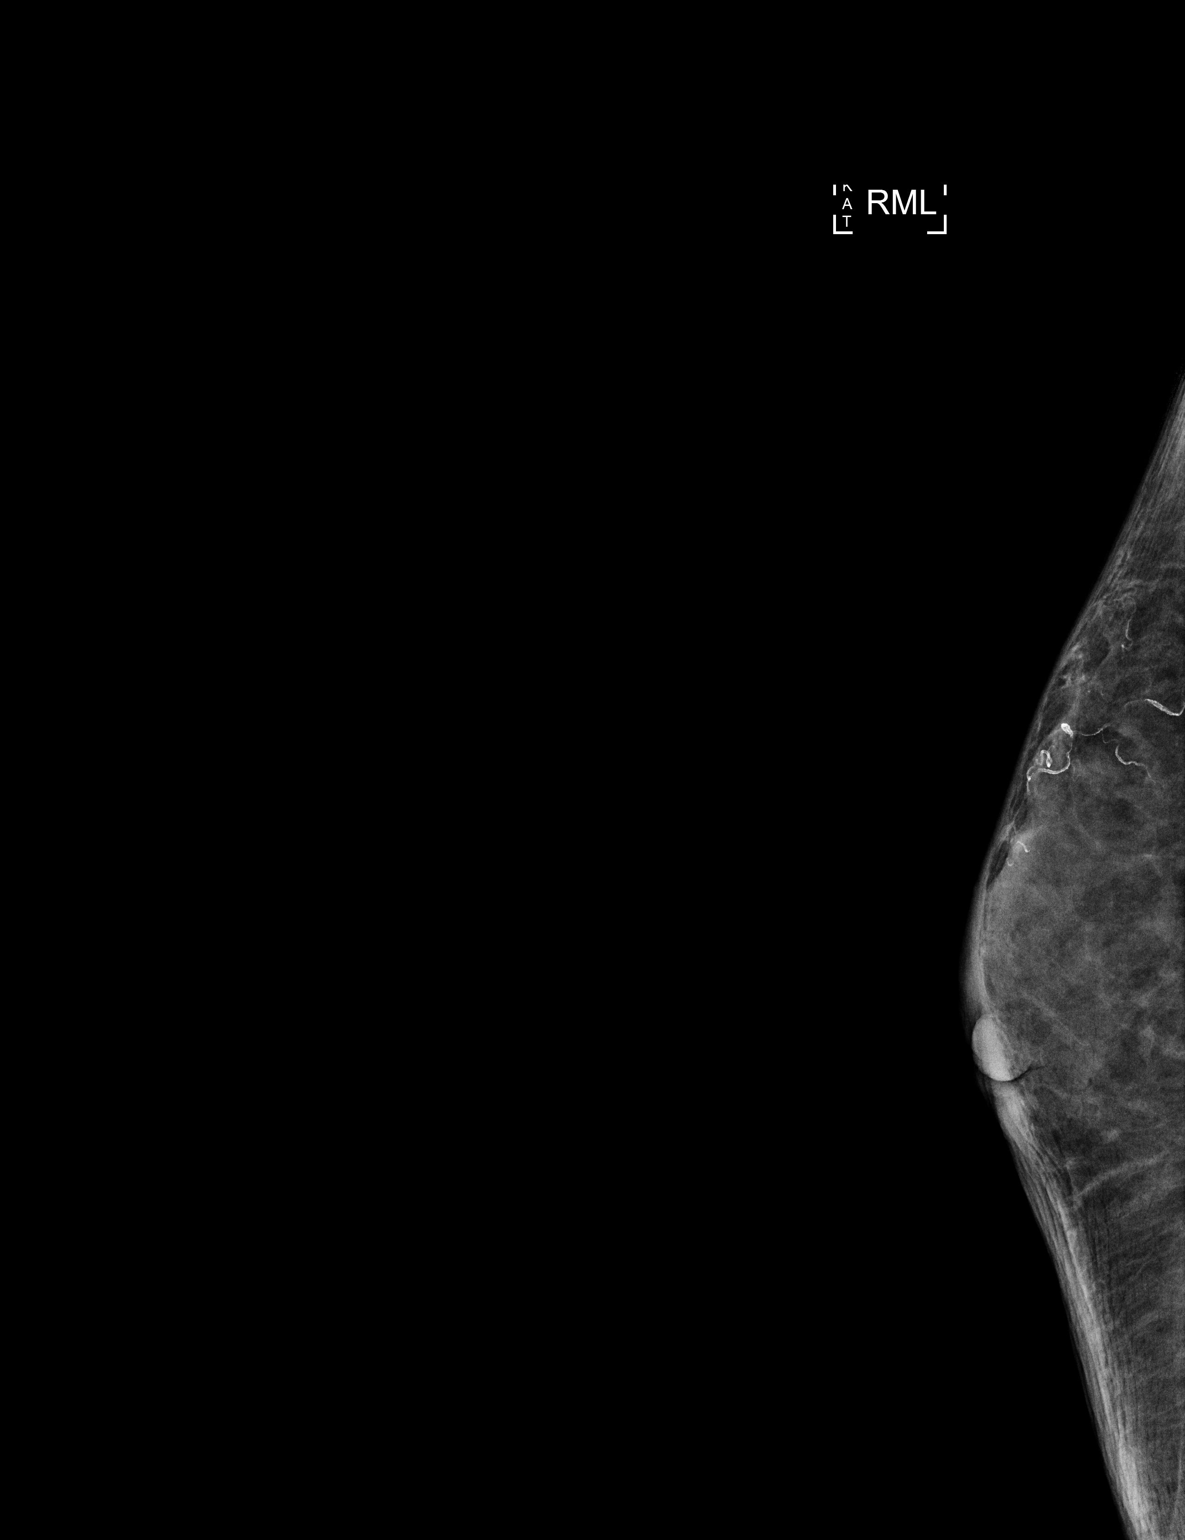

[R ML (2 of 2)]
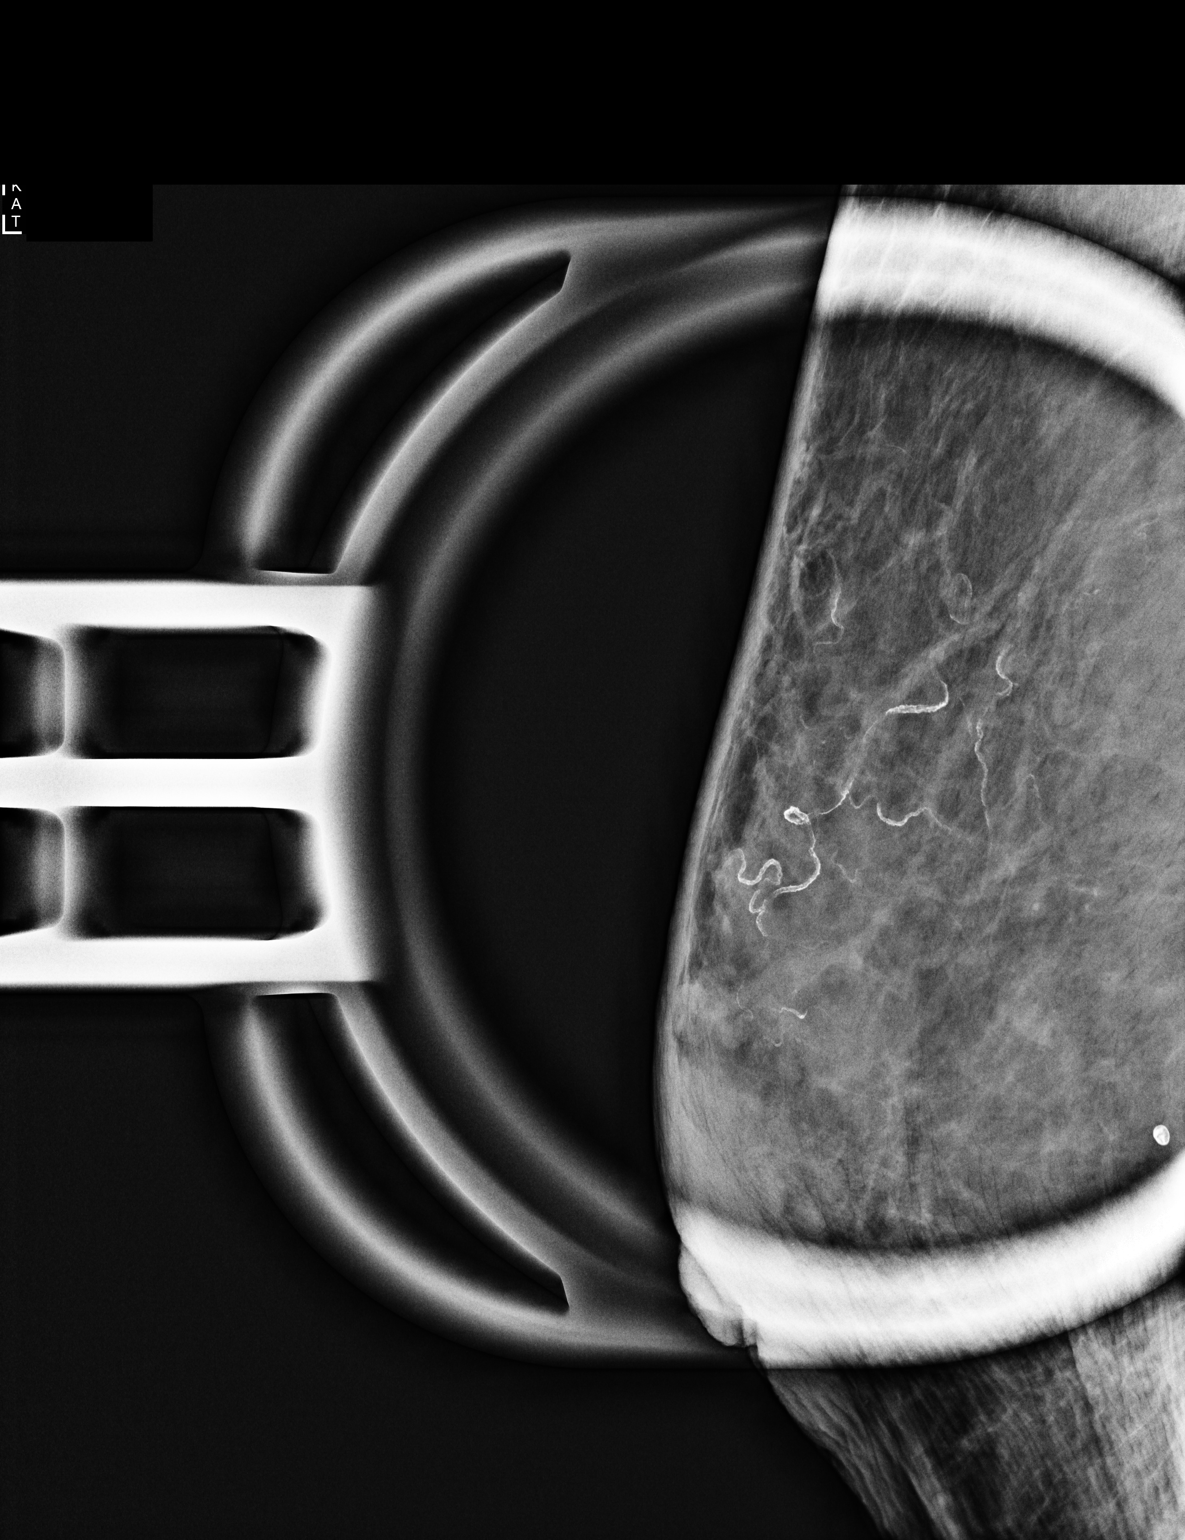

[R CC]
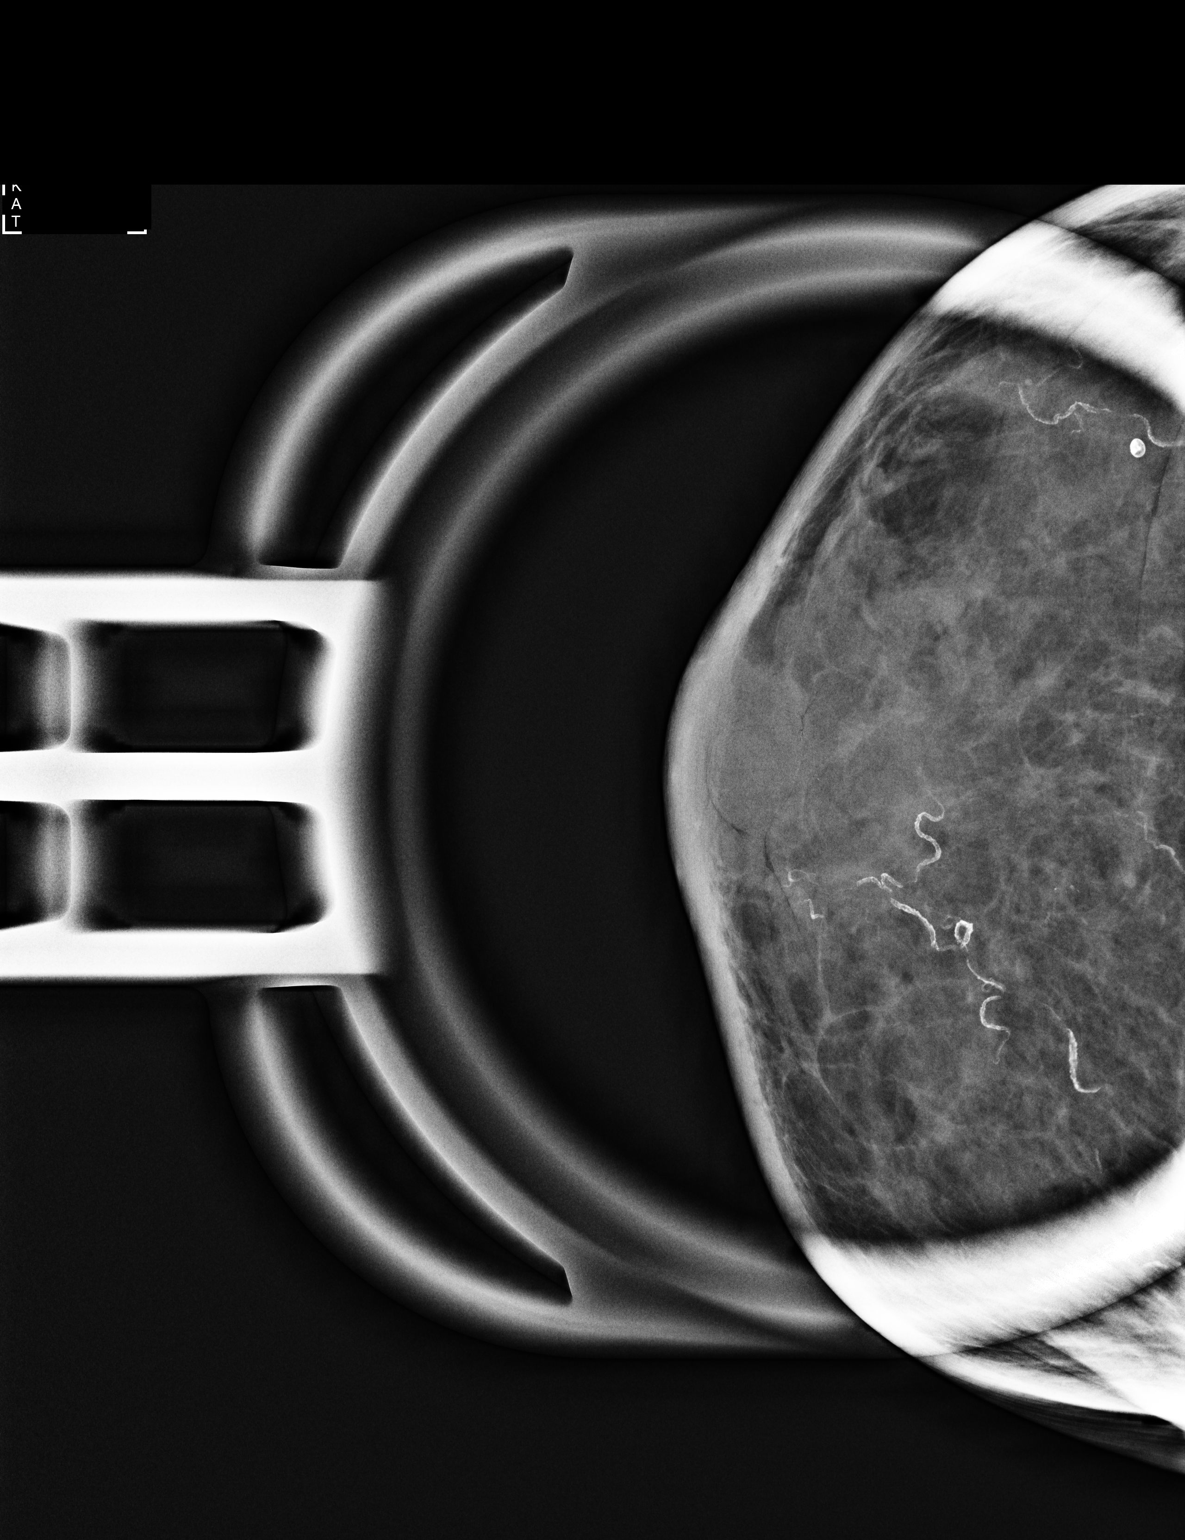

[3 of 3 positions shown; findings below may reference images not displayed]

ACR Breast Density Category c: The breast tissue is heterogeneously
dense, which may obscure small masses.
FINDINGS: On today's additional diagnostic views, including magnification
views, several grouped punctate calcifications are confirmed within
the upper RIGHT breast, stable compared to earlier exams confirming
benignity. There are additional vascular calcifications within the
upper RIGHT breast, also stable. No suspicious pleomorphic or fine
linear branching calcifications are identified.
IMPRESSION: No evidence of malignancy. Benign calcifications within the upper
RIGHT breast.

Patient may return to routine annual bilateral screening mammogram
schedule.

RECOMMENDATION:
Screening mammogram in one year.(Code:D6-2-K4L)

I have discussed the findings and recommendations with the patient.
If applicable, a reminder letter will be sent to the patient
regarding the next appointment.

BI-RADS CATEGORY  2: Benign.

## 2023-12-16 ENCOUNTER — Encounter: Payer: Self-pay | Admitting: Podiatry

## 2023-12-16 ENCOUNTER — Ambulatory Visit (INDEPENDENT_AMBULATORY_CARE_PROVIDER_SITE_OTHER): Admitting: Podiatry

## 2023-12-16 DIAGNOSIS — M79674 Pain in right toe(s): Secondary | ICD-10-CM | POA: Diagnosis not present

## 2023-12-16 DIAGNOSIS — B351 Tinea unguium: Secondary | ICD-10-CM

## 2023-12-16 DIAGNOSIS — M79675 Pain in left toe(s): Secondary | ICD-10-CM | POA: Diagnosis not present

## 2023-12-16 DIAGNOSIS — E0843 Diabetes mellitus due to underlying condition with diabetic autonomic (poly)neuropathy: Secondary | ICD-10-CM

## 2023-12-16 NOTE — Progress Notes (Signed)
This patient returns to my office for at risk foot care.  This patient requires this care by a professional since this patient will be at risk due to having diabetes.This patient is unable to cut nails herself since the patient cannot reach her nails.These nails are painful walking and wearing shoes.  This patient presents for at risk foot care today.  General Appearance  Alert, conversant and in no acute stress.  Vascular  Dorsalis pedis and posterior tibial  pulses are  weakly palpable  bilaterally.  Capillary return is within normal limits  bilaterally. Temperature is within normal limits  bilaterally.  Neurologic  Senn-Weinstein monofilament wire test within normal limits  bilaterally. Muscle power within normal limits bilaterally.  Nails Thick disfigured discolored nails with subungual debris  from hallux to fifth toes bilaterally. No evidence of bacterial infection or drainage bilaterally.  Orthopedic  No limitations of motion  feet .  No crepitus or effusions noted.  No bony pathology or digital deformities noted.  Skin  normotropic skin with no porokeratosis noted bilaterally.  No signs of infections or ulcers noted.     Onychomycosis  Pain in right toes  Pain in left toes  Consent was obtained for treatment procedures.   Mechanical debridement of nails 1-5  bilaterally performed with a nail nipper.  Filed with dremel without incident.    Return office visit   3 months                   Told patient to return for periodic foot care and evaluation due to potential at risk complications.   Gracelin Weisberg DPM  

## 2024-03-09 ENCOUNTER — Ambulatory Visit: Admitting: Podiatry

## 2024-03-24 ENCOUNTER — Ambulatory Visit: Admitting: Podiatry

## 2024-03-24 DIAGNOSIS — Z91199 Patient's noncompliance with other medical treatment and regimen due to unspecified reason: Secondary | ICD-10-CM

## 2024-03-24 NOTE — Progress Notes (Signed)
 1. No-show for appointment

## 2024-05-05 ENCOUNTER — Encounter: Payer: Self-pay | Admitting: Podiatry

## 2024-05-05 ENCOUNTER — Ambulatory Visit: Admitting: Podiatry

## 2024-05-05 DIAGNOSIS — M2141 Flat foot [pes planus] (acquired), right foot: Secondary | ICD-10-CM

## 2024-05-05 DIAGNOSIS — M79674 Pain in right toe(s): Secondary | ICD-10-CM | POA: Diagnosis not present

## 2024-05-05 DIAGNOSIS — E1151 Type 2 diabetes mellitus with diabetic peripheral angiopathy without gangrene: Secondary | ICD-10-CM | POA: Diagnosis not present

## 2024-05-05 DIAGNOSIS — B351 Tinea unguium: Secondary | ICD-10-CM | POA: Diagnosis not present

## 2024-05-05 DIAGNOSIS — Z0189 Encounter for other specified special examinations: Secondary | ICD-10-CM | POA: Diagnosis not present

## 2024-05-05 DIAGNOSIS — M2142 Flat foot [pes planus] (acquired), left foot: Secondary | ICD-10-CM | POA: Diagnosis not present

## 2024-05-05 DIAGNOSIS — M792 Neuralgia and neuritis, unspecified: Secondary | ICD-10-CM

## 2024-05-05 DIAGNOSIS — M79675 Pain in left toe(s): Secondary | ICD-10-CM

## 2024-05-05 DIAGNOSIS — E119 Type 2 diabetes mellitus without complications: Secondary | ICD-10-CM

## 2024-05-05 NOTE — Patient Instructions (Addendum)
 Nerve Damage From Diabetes (Diabetic Neuropathy): What to Know Diabetic neuropathy is when diabetes causes damage to your nerves. Over time, people with diabetes can get nerve damage throughout the body.  There are different types of diabetic neuropathy: Peripheral neuropathy. This is the most common type. It damages the peripheral nerves. These are nerves that send signals between the spinal cord and other parts of the body. This type usually affects the feet, legs, hands, and arms. Autonomic neuropathy. This type causes damage to autonomic nerves. These nerves control things that your body does automatically and that you don't control. This includes the heartbeat, body temperature, blood pressure, peeing, digestion, sweating, and sexual function. It can also affect your body's response to changes in blood sugar (glucose). Focal neuropathy. This type affects one area of the body, like an arm, a leg, or the face. It may involve one nerve or a small group of nerves. It can be painful and hard to predict. It occurs most often in older adults with diabetes. This type may come on suddenly. It usually gets better over time and doesn't cause long-term problems. Proximal neuropathy. This type affects the nerves of the thighs, hips, butt, or legs. It causes very bad pain, weakness, and muscle death, usually in the thigh muscles. It's more common among older men and people who have type 2 diabetes. The time it takes to recover may vary. What are the causes? Peripheral, autonomic, and focal neuropathies are caused by diabetes that's not well controlled with treatment.  The cause of proximal neuropathy isn't known. It may be linked to irritation and swelling caused by not controlling blood sugar levels. What are the signs or symptoms? Peripheral neuropathy The nerve damage happens slowly over time. When the nerves of the feet and legs no longer work, you may have: Burning, stabbing, or aching pain in the legs or  feet. Cramping in the legs or feet. Loss of feeling (numbness) and not being able to feel pressure or pain in the feet. This can lead to: Thick calluses or sores, also called ulcers, on areas of constant pressure. Not being able to feel temperature changes. Changes in the shape of the feet. Muscle weakness. Loss of balance or coordination. Autonomic neuropathy Symptoms vary depending on which nerves are affected. Symptoms may include: Problems with digestion, like: Throwing up or feeling like you may throw up. Poor appetite. Bloating. Watery poop (diarrhea) or trouble pooping (constipation). Trouble swallowing. Losing weight without trying to. Problems with the heart, blood, and lungs, such as: Feeling dizzy, especially when standing up. Fainting. Shortness of breath. Irregular heartbeat. Bladder problems, such as: Trouble starting or stopping when you pee. Leaking pee. Trouble emptying the bladder. Urinary tract infections (UTIs). Problems with other body functions, such as: Sweat. You may sweat too much or too little. Temperature. You might get hot easily. Or, you might feel cold more than usual. Sexual function. Males may not be able to get or keep an erection. Females may have vaginal dryness and trouble with arousal. Focal neuropathy Symptoms affect only one area of the body. Common symptoms include: Numbness or tingling. Burning pain. A prickling feeling. Very sensitive skin. Weakness. Not being able to move (paralysis). Muscle twitching. Muscles getting smaller, or wasting. Poor coordination. Double or blurred vision. Proximal neuropathy Sudden, very bad pain in the hip, thigh, or butt. Pain may spread from the back into the legs. This is called sciatica. Pain and numbness in the arms and legs. Tingling. Losing control of peeing  and pooping. Weakness and wasting of thigh muscles. Trouble getting up from a seated position. Swelling of the belly. Losing weight  without trying to. How is this diagnosed? Diagnosis varies depending on the type of neuropathy your health care provider thinks you have. It may include: A neurologic exam. This exam checks: Your reflexes. How you move. What you can feel. Blood tests. Lumbar puncture. This tests the fluid that surrounds the spinal cord. Imaging tests, such as: A CT scan. An MRI. Tests on the nerves, such as: Electromyogram (EMG). This checks the nerves that control muscles. Nerve conduction study. This test checks how quickly signals pass through your nerves. Biopsy. This is when a small piece of a nerve is removed for testing. For autonomic neuropathy, you may have tests to check things like: Your blood pressure. Your heart rate. Your breathing. Your digestion. Your bladder. There's no specific test for proximal neuropathy. But you may have tests to rule out other causes of your symptoms. How is this treated? The goal of treatment is to keep nerve damage from getting worse. Treatment may include: Following your diabetes management plan. This will help keep your blood sugar level and your A1C level within your target range. This is the most important treatment. Taking pain medicine. Follow these instructions at home: Diabetes management Follow your diabetes management plan as told by your provider. Check your blood sugar levels. Keep your blood sugar in your target range. Have your A1C level checked at least 2 times a year, or as often as told. Take your medicines only as told. This includes insulin  and diabetes medicine. Lifestyle  Do not smoke, vape, or use nicotine or tobacco. Be physically active every day. Include strength training and balance exercises. Follow a healthy meal plan. Work with your provider to manage your blood pressure. General instructions Ask your provider if it's safe to drive or use machines while taking your medicine. Check your skin and feet every day  for: Cuts. Bruises. Redness. Blisters. Sores. Contact a health care provider if: You have any new symptoms. Your symptoms get worse. Your symptoms don't get better with treatment. Get help right away if: You have sudden weakness or loss of coordination. You have trouble speaking. You have pain or pressure in your chest. You're not able to move a part of your body all of a sudden. These symptoms may be an emergency. Call 911 right away. Do not wait to see if the symptoms will go away. Do not drive yourself to the hospital. This information is not intended to replace advice given to you by your health care provider. Make sure you discuss any questions you have with your health care provider. Document Revised: 12/11/2022 Document Reviewed: 12/11/2022 Elsevier Patient Education  2025 [employer].

## 2024-05-14 NOTE — Progress Notes (Signed)
 "  Subjective:  Patient ID: Ana Hines, female    DOB: [birth year],  MRN: [id number]  Ana Hines presents to clinic today for at risk foot care with history of diabetic neuropathy and painful thick toenails that are difficult to trim. Pain interferes with ambulation. Aggravating factors include wearing enclosed shoe gear. Pain is relieved with periodic professional debridement. Patient continues to complain of numbness and tingling in both feet which disturb her sleep nightly. Chief Complaint  Patient presents with   [facility]    [facility]. Pt is having tingling and burning in feet.  A1c PT states it was 12.1 before Thanksgiving.  Delores Rojelio Caldron, NP (PCP), Nov. 2025     PCP is Delores Rojelio Caldron, NP.  Allergies[1]  Review of Systems: Negative except as noted in the HPI.  Objective: No changes noted in today's physical examination. There were no vitals filed for this visit. Ana Hines is a pleasant 74 y.o. female thin build in NAD. AAO x 3.   Diabetic foot exam was performed with the following findings:   Vascular Examination: CFT <4 seconds b/l. DP pulses diminished b/l. PT pulses diminished b/l. Digital hair absent. Skin temperature gradient warm to cool b/l. No ischemia or gangrene. No cyanosis or clubbing noted b/l. No edema noted b/l LE.   Neurological Examination: Sensation grossly intact b/l with 10 gram monofilament. Vibratory sensation intact b/l. Pt has subjective symptoms of neuropathy.  Dermatological Examination: Pedal skin thin, shiny and atrophic b/l. No open wounds. No interdigital macerations.   Toenails 1-5 b/l thick, discolored, elongated with subungual debris and pain on dorsal palpation.   No hyperkeratotic nor porokeratotic lesions.  Musculoskeletal Examination: Muscle strength 5/5 to all lower extremity muscle groups bilaterally. 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  Radiographs: None    Assessment/Plan: 1. Pain due to onychomycosis  of toenails of both feet   2. Neuropathic pain   3. Pes planus of both feet   4. Type II diabetes mellitus with peripheral circulatory disorder (HCC)   5. Encounter for diabetic foot exam (HCC)   AMB REFERRAL TO NEUROLOGY Diabetic foot examination performed today. All patient's and/or POA's questions/concerns addressed on today's visit. Toenails 1-5 b/l debrided in length and girth without incident. Continue foot and shoe inspections daily. Monitor blood glucose per PCP/Endocrinologist's recommendations. Continue soft, supportive shoe gear daily. Report any pedal injuries to medical professional. Call office if there are any questions/concerns. -Discussed neuropathic pain. Encouraged trying to decrease A1c. Patient desires further workup. Neurology consultation placed today for evaluation/treatment of diabetic neuropathic pain. -Patient/POA to call should there be question/concern in the interim.   Return in about 3 months (around 08/03/2024).  Ana Hines, DPM      [place] LOCATION: [address], [place] [address]                   Office [phone]   [facility] LOCATION: [address] [place], [place] [address] Office [phone]     [1]  Allergies Allergen Reactions   Lisinopril      Other reaction(s): Angioedema   Sulfa Antibiotics Hives    Has patient had a PCN reaction causing immediate rash,  facial/tongue/throat swelling, SOB or lightheadedness with hypotension: Unknown Has patient had a PCN reaction causing severe rash involving mucus membranes or skin necrosis: Unknown Has patient had a PCN reaction that required hospitalization: Unknown Has patient had a PCN reaction occurring within the last 10 years: Unknown If all of the above answers are NO, then may proceed with Cephalosporin use.    Sulfonamide Derivatives    "

## 2024-08-10 ENCOUNTER — Ambulatory Visit: Admitting: Podiatry
